# Patient Record
Sex: Male | Born: 1965 | Race: White | Hispanic: No | Marital: Single | Smoking: Former smoker
Health system: Southern US, Community
[De-identification: ages and names within clinical notes are randomized; demographics above are authoritative.]

## PROBLEM LIST (undated history)

## (undated) DIAGNOSIS — K7581 Nonalcoholic steatohepatitis (NASH): Secondary | ICD-10-CM

## (undated) DIAGNOSIS — K219 Gastro-esophageal reflux disease without esophagitis: Secondary | ICD-10-CM

## (undated) DIAGNOSIS — M069 Rheumatoid arthritis, unspecified: Secondary | ICD-10-CM

## (undated) DIAGNOSIS — R351 Nocturia: Secondary | ICD-10-CM

## (undated) DIAGNOSIS — E785 Hyperlipidemia, unspecified: Secondary | ICD-10-CM

## (undated) DIAGNOSIS — I7300 Raynaud's syndrome without gangrene: Secondary | ICD-10-CM

## (undated) DIAGNOSIS — R0600 Dyspnea, unspecified: Secondary | ICD-10-CM

## (undated) DIAGNOSIS — J4520 Mild intermittent asthma, uncomplicated: Secondary | ICD-10-CM

## (undated) DIAGNOSIS — I509 Heart failure, unspecified: Secondary | ICD-10-CM

## (undated) DIAGNOSIS — Z87442 Personal history of urinary calculi: Secondary | ICD-10-CM

## (undated) DIAGNOSIS — M1990 Unspecified osteoarthritis, unspecified site: Secondary | ICD-10-CM

## (undated) DIAGNOSIS — R748 Abnormal levels of other serum enzymes: Secondary | ICD-10-CM

## (undated) DIAGNOSIS — M5431 Sciatica, right side: Secondary | ICD-10-CM

## (undated) DIAGNOSIS — Z8249 Family history of ischemic heart disease and other diseases of the circulatory system: Secondary | ICD-10-CM

## (undated) DIAGNOSIS — E782 Mixed hyperlipidemia: Secondary | ICD-10-CM

## (undated) DIAGNOSIS — Z973 Presence of spectacles and contact lenses: Secondary | ICD-10-CM

## (undated) DIAGNOSIS — Z8489 Family history of other specified conditions: Secondary | ICD-10-CM

## (undated) DIAGNOSIS — M6281 Muscle weakness (generalized): Secondary | ICD-10-CM

## (undated) DIAGNOSIS — N289 Disorder of kidney and ureter, unspecified: Secondary | ICD-10-CM

## (undated) HISTORY — PX: COLONOSCOPY: SHX174

## (undated) HISTORY — DX: Family history of ischemic heart disease and other diseases of the circulatory system: Z82.49

## (undated) HISTORY — DX: Hyperlipidemia, unspecified: E78.5

---
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
## 1999-02-10 ENCOUNTER — Encounter: Payer: Self-pay | Admitting: Family Medicine

## 1999-02-10 ENCOUNTER — Ambulatory Visit (HOSPITAL_COMMUNITY): Admission: RE | Admit: 1999-02-10 | Discharge: 1999-02-10 | Payer: Self-pay | Admitting: Family Medicine

## 1999-02-11 ENCOUNTER — Emergency Department (HOSPITAL_COMMUNITY): Admission: EM | Admit: 1999-02-11 | Discharge: 1999-02-11 | Payer: Self-pay | Admitting: Emergency Medicine

## 2001-09-17 DIAGNOSIS — K760 Fatty (change of) liver, not elsewhere classified: Secondary | ICD-10-CM

## 2001-09-17 HISTORY — DX: Fatty (change of) liver, not elsewhere classified: K76.0

## 2002-03-17 HISTORY — PX: UPPER GI ENDOSCOPY: SHX6162

## 2002-04-17 HISTORY — PX: LIVER BIOPSY: SHX301

## 2002-04-27 ENCOUNTER — Ambulatory Visit (HOSPITAL_COMMUNITY): Admission: RE | Admit: 2002-04-27 | Discharge: 2002-04-27 | Payer: Self-pay | Admitting: Gastroenterology

## 2002-05-29 ENCOUNTER — Encounter: Payer: Self-pay | Admitting: Rheumatology

## 2002-05-29 ENCOUNTER — Ambulatory Visit (HOSPITAL_COMMUNITY): Admission: RE | Admit: 2002-05-29 | Discharge: 2002-05-29 | Payer: Self-pay | Admitting: Rheumatology

## 2002-05-29 HISTORY — PX: LIVER BIOPSY: SHX301

## 2002-12-30 ENCOUNTER — Encounter: Payer: Self-pay | Admitting: Rheumatology

## 2002-12-30 ENCOUNTER — Encounter: Admission: RE | Admit: 2002-12-30 | Discharge: 2002-12-30 | Payer: Self-pay | Admitting: Rheumatology

## 2006-04-08 ENCOUNTER — Encounter: Admission: RE | Admit: 2006-04-08 | Discharge: 2006-04-08 | Payer: Self-pay | Admitting: Family Medicine

## 2011-11-27 ENCOUNTER — Other Ambulatory Visit: Payer: Self-pay

## 2011-11-27 ENCOUNTER — Encounter (HOSPITAL_COMMUNITY): Payer: Self-pay | Admitting: Cardiology

## 2011-11-27 ENCOUNTER — Emergency Department (HOSPITAL_COMMUNITY)
Admission: EM | Admit: 2011-11-27 | Discharge: 2011-11-27 | Disposition: A | Discharge status: Home / Self Care | Payer: 59 | Attending: Emergency Medicine | Admitting: Emergency Medicine

## 2011-11-27 DIAGNOSIS — R51 Headache: Secondary | ICD-10-CM | POA: Insufficient documentation

## 2011-11-27 DIAGNOSIS — R000 Tachycardia, unspecified: Secondary | ICD-10-CM | POA: Insufficient documentation

## 2011-11-27 DIAGNOSIS — J45909 Unspecified asthma, uncomplicated: Secondary | ICD-10-CM | POA: Insufficient documentation

## 2011-11-27 DIAGNOSIS — R509 Fever, unspecified: Secondary | ICD-10-CM | POA: Insufficient documentation

## 2011-11-27 HISTORY — DX: Unspecified osteoarthritis, unspecified site: M19.90

## 2011-11-27 LAB — DIFFERENTIAL
Eosinophils Relative: 3 % (ref 0–5)
Lymphocytes Relative: 17 % (ref 12–46)
Lymphs Abs: 1.4 10*3/uL (ref 0.7–4.0)
Monocytes Absolute: 0.7 10*3/uL (ref 0.1–1.0)

## 2011-11-27 LAB — BASIC METABOLIC PANEL
BUN: 12 mg/dL (ref 6–23)
CO2: 26 mEq/L (ref 19–32)
Calcium: 8.6 mg/dL (ref 8.4–10.5)
Creatinine, Ser: 1.27 mg/dL (ref 0.50–1.35)
Glucose, Bld: 90 mg/dL (ref 70–99)

## 2011-11-27 LAB — CBC
HCT: 44.5 % (ref 39.0–52.0)
MCH: 34.8 pg — ABNORMAL HIGH (ref 26.0–34.0)
MCV: 98.7 fL (ref 78.0–100.0)
RBC: 4.51 MIL/uL (ref 4.22–5.81)
WBC: 8.3 10*3/uL (ref 4.0–10.5)

## 2011-11-27 MED ORDER — KETOROLAC TROMETHAMINE 15 MG/ML IJ SOLN
30.0000 mg | Freq: Once | INTRAMUSCULAR | Status: DC
  Filled 2011-11-27: qty 2

## 2011-11-27 MED ORDER — SODIUM CHLORIDE 0.9 % IV BOLUS (SEPSIS)
1000.0000 mL | Freq: Once | INTRAVENOUS | Status: AC
  Administered 2011-11-27: 1000 mL via INTRAVENOUS

## 2011-11-27 MED ORDER — HYDROMORPHONE HCL PF 1 MG/ML IJ SOLN
1.0000 mg | Freq: Once | INTRAMUSCULAR | Status: AC
  Administered 2011-11-27: 1 mg via INTRAVENOUS
  Filled 2011-11-27: qty 1

## 2011-11-27 MED ORDER — ACETAMINOPHEN 500 MG PO TABS
1000.0000 mg | ORAL_TABLET | Freq: Once | ORAL | Status: AC
  Administered 2011-11-27: 1000 mg via ORAL
  Filled 2011-11-27: qty 2

## 2011-11-27 MED ORDER — DIPHENHYDRAMINE HCL 50 MG/ML IJ SOLN
25.0000 mg | Freq: Once | INTRAMUSCULAR | Status: AC
  Administered 2011-11-27: 25 mg via INTRAVENOUS
  Filled 2011-11-27: qty 1

## 2011-11-27 MED ORDER — METOCLOPRAMIDE HCL 5 MG/ML IJ SOLN
10.0000 mg | Freq: Once | INTRAMUSCULAR | Status: AC
  Administered 2011-11-27: 10 mg via INTRAVENOUS
  Filled 2011-11-27: qty 2

## 2011-11-27 MED ORDER — SODIUM CHLORIDE 0.9 % IV SOLN
Freq: Once | INTRAVENOUS | Status: AC
  Administered 2011-11-27: 12:00:00 via INTRAVENOUS

## 2011-11-27 MED ORDER — KETOROLAC TROMETHAMINE 30 MG/ML IJ SOLN
INTRAMUSCULAR | Status: AC
  Administered 2011-11-27: 30 mg
  Filled 2011-11-27: qty 1

## 2011-11-27 MED ORDER — HYDROCODONE-ACETAMINOPHEN 5-325 MG PO TABS: ORAL_TABLET | ORAL | Status: DC

## 2011-11-27 NOTE — ED Notes (Signed)
Family at bedside. 

## 2011-11-27 NOTE — ED Notes (Signed)
Patient is resting comfortably. 

## 2011-11-27 NOTE — ED Provider Notes (Signed)
Medical screening examination/treatment/procedure(s) were conducted as a shared visit with non-physician practitioner(s) and myself.  I personally evaluated the patient during the encounter.  Fever to 103, sinus tachy on monitor and on ECG to 140, likely related to fever.  No neck stiffness, no AMS, no focal deficits, doubt meningitis.  Pt has chronic h/o sinus problems.  Will treat with antipyretics, IVF's and monitor for improvement.    Gavin Pound. Jasmeen Fritsch, MD 11/27/11 1544

## 2011-11-27 NOTE — Discharge Instructions (Signed)
Please read and follow all provided instructions.  Your diagnoses today include:  1. Fever   2. Headache     Tests performed today include:  Blood counts and electrolytes  Vital signs. See below for your results today.   Medications prescribed:   Vicodin (hydrocodone/acetaminophen) - narcotic pain medication  Take any prescribed medications only as directed.  Home care instructions:  Follow any educational materials contained in this packet.  Follow-up instructions: Please follow-up with your primary care provider in the next 2 days for further evaluation of your symptoms. If you do not have a primary care doctor -- see below for referral information.   Return instructions:   Please return to the Emergency Department if you experience worsening symptoms.  Please return if you have worsening severe headache, persistent vomiting, blurry vision, trouble walking, persistent fever, or if you have any other concerns.  Please return if you have any other emergent concerns.  Additional Information:  Your vital signs today were: BP 114/71  Pulse 109  Temp(Src) 100.6 F (38.1 C) (Oral)  Resp 16  SpO2 94% If your blood pressure (BP) was elevated above 135/85 this visit, please have this repeated by your doctor within one month. -------------- No Primary Care Doctor Call [facility]  [phone] Other agencies that provide inexpensive medical care    [facility]  [phone]    [facility]  [phone]    [facility]  [phone]    [facility]  [phone]    [facility]  [phone]    [facility]  [phone] -------------- RESOURCE GUIDE:  Dental Problems  Patients with Medicaid: [facility] [phone] [address]                                            [phone] [address] Phone:  [phone]                                                   Phone:  [phone]  If unable  to pay or uninsured, contact:  [facility] or [facility]. to become qualified for the adult dental clinic.  Chronic Pain Problems Contact [facility]  [phone] Patients need to be referred by their primary care doctor.  Insufficient Money for Medicine Contact [employer]:  call "211" or [facility] [phone].  Psychological Services [facility]  [phone] [facility]  [phone] [facility]   [phone] (emergency services [phone])  Substance Abuse Resources [facility]  [phone] [facility] [phone] [facility] [place] [phone] [place] [phone] Residential & Outpatient Substance Abuse Program  [phone]  Abuse/Neglect [facility] Child Abuse Hotline [phone] [facility] Child Abuse Hotline [phone] (After Hours)  Emergency Shelter [facility] [phone]  Maternity Homes [facility] at the [place] [facility] [place] [phone] [facility] [phone]  [facility]  [facility] [address] [place]                       [address]      [address]  [place]                                                [place]                            [place] Phone:  [phone]                                   Phone:  [phone]                 Phone:  [phone]  [facility] Phone:  [phone]  [place] Child Abuse Hotline [phone]) 342-1394 [phone] (After Hours)    

## 2011-11-27 NOTE — ED Provider Notes (Signed)
History     CSN: [id number]  Arrival date & time 11/27/11  1107   First MD Initiated Contact with Patient 11/27/11 1107      Chief Complaint  Patient presents with  . Headache  . Tachycardia    (Consider location/radiation/quality/duration/timing/severity/associated sxs/prior treatment) HPI Comments: Patient presents with onset of bilateral headache described as pressure beginning 2 days ago. Patient does not have a history of headaches and denies photo and phonophobia. No nausea or vomiting. Headache initially improved with over-the-counter medications however has been persistent over the past 24 hours despite treatment. Patient denies numbness/tingling/weakness in extremities. He has not had any trouble speaking or trouble walking. Denies blurry vision. Patient presents to the emergency department with fever to 103F and heart rate in the 140s. Patient denies chest pain or history of heart problems. Patient denies cough. Patient has chronic sinus problems.  Patient is a 46 y.o. male presenting with headaches. The history is provided by the patient.  Headache  This is a new problem. The current episode started 2 days ago. The problem occurs constantly. The problem has not changed since onset.The headache is associated with nothing. The pain is located in the bilateral region. The pain is moderate. The pain does not radiate. Associated symptoms include a fever. Pertinent negatives include no chest pressure, no near-syncope, no shortness of breath, no nausea and no vomiting. He has tried acetaminophen for the symptoms. The treatment provided mild relief.    Past Medical History  Diagnosis Date  . Arthritis   . Asthma   . Allergic arthritis     History reviewed. No pertinent past surgical history.  History reviewed. No pertinent family history.  History  Substance Use Topics  . Smoking status: Never Smoker   . Smokeless tobacco: Not on file  . Alcohol Use: Yes      Review of  Systems  Constitutional: Positive for fever.  HENT: Positive for congestion and sinus pressure. Negative for ear pain, sore throat, rhinorrhea, neck pain and neck stiffness.   Eyes: Negative for redness.  Respiratory: Negative for cough and shortness of breath.   Cardiovascular: Negative for chest pain and near-syncope.  Gastrointestinal: Negative for nausea, vomiting, abdominal pain and diarrhea.  Genitourinary: Negative for dysuria.  Musculoskeletal: Negative for myalgias.  Skin: Negative for rash.  Neurological: Positive for headaches. Negative for dizziness, syncope, speech difficulty, weakness, light-headedness and numbness.  Psychiatric/Behavioral: Negative for confusion.    Allergies  Review of patient's allergies indicates no known allergies.  Home Medications  No current outpatient prescriptions on file.  BP 150/99  Temp(Src) 103 F (39.4 C) (Oral)  Resp 17  SpO2 96%  Physical Exam  Nursing note and vitals reviewed. Constitutional: He is oriented to person, place, and time. He appears well-developed and well-nourished.  HENT:  Head: Normocephalic and atraumatic. Head is without raccoon's eyes and without Battle's sign.  Right Ear: Tympanic membrane, external ear and ear canal normal.  Left Ear: Tympanic membrane, external ear and ear canal normal.  Nose: Mucosal edema present. No rhinorrhea. Right sinus exhibits frontal sinus tenderness. Left sinus exhibits frontal sinus tenderness.  Mouth/Throat: Oropharynx is clear and moist. Mucous membranes are not dry.  Eyes: Conjunctivae are normal. Pupils are equal, round, and reactive to light. Right eye exhibits no discharge. Left eye exhibits no discharge.  Neck: Trachea normal and normal range of motion. Neck supple. No rigidity. Normal range of motion present.       No meningismus  Cardiovascular: Regular rhythm,  normal heart sounds and intact distal pulses.  Tachycardia present.   No murmur heard. Pulmonary/Chest: Effort  normal and breath sounds normal.  Abdominal: Soft. There is no tenderness. There is no rebound and no guarding.  Musculoskeletal: He exhibits no edema and no tenderness.  Neurological: He is alert and oriented to person, place, and time.  Skin: Skin is warm and dry.  Psychiatric: He has a normal mood and affect.    ED Course  Procedures (including critical care time)  Labs Reviewed  CBC - Abnormal; Notable for the following:    MCH 34.8 (*)    All other components within normal limits  BASIC METABOLIC PANEL - Abnormal; Notable for the following:    GFR calc non Af Amer 66 (*)    GFR calc Af Amer 76 (*)    All other components within normal limits  DIFFERENTIAL   No results found.   1. Fever   2. Headache     11:25 AM Patient seen and examined. Work-up initiated. Medications ordered. Discussed with and seen with Dr. Oletta Lamas.   Vital signs reviewed and are as follows: Filed Vitals:   11/27/11 1117  BP: 150/99  Temp: 103 F (39.4 C)  Resp: 17    Date: 11/27/2011  Rate: 144  Rhythm: sinus tachycardia  QRS Axis: right  Intervals: normal  ST/T Wave abnormalities: normal  Conduction Disutrbances:none  Narrative Interpretation:   Old EKG Reviewed: none available  1:20 PM exam unchanged. Patient is continuing to complain about significant pain. He had mild improvement with Toradol. Heart rate 125. Will give additional bolus. Will try Reglan Benadryl as well as pain medication.  2:24 PM Fever improved. Pt states pain is gone after reglan/dilaudid. HR 100. Will monitor.   3:33 PM Patient re-examined. Exam unchanged. Pt states mild headache but much improved from earlier. He is agreeable to discharge home. Pt sees Dr. Cindee Lame and can follow-up. Urged patient to return with worsening symptoms including worsening severe headache, vomiting, blurry vision, trouble walking, confusion, or any other concerns. Tachycardia remains resolved.  MDM  Patient with headache and tachycardia.  Tachycardia likely secondary to dehydration and fever. Tachycardia resolved with fluids and antipyretics. Headache is improved after pain medication and Reglan. Neuro exam is normal. Patient does not have any neurological deficits. No history of head trauma. Do not suspect intracranial abnormality at this point. Do not suspect meningitis/encephalitis given no meningismus or altered sensorium. CT scan is not indicated at this point. Patient will need to follow up with his primary care physician. Symptoms are likely secondary to a viral infection.        Renne Crigler, [place] 11/27/11 1542

## 2011-11-27 NOTE — ED Notes (Signed)
Pt to department via EMS from home c/o headache that started on Sunday. Reports that he has had sinus pressure and allergies. Bp- 149/109 HR-140. No acute distress on arrival. Pt was negative for orthostatics, but tachycardic in route.

## 2011-11-30 ENCOUNTER — Ambulatory Visit
Admission: RE | Admit: 2011-11-30 | Discharge: 2011-11-30 | Disposition: A | Discharge status: Home / Self Care | Payer: 59 | Source: Ambulatory Visit | Attending: Internal Medicine | Admitting: Internal Medicine

## 2011-11-30 ENCOUNTER — Other Ambulatory Visit: Payer: Self-pay | Admitting: Internal Medicine

## 2011-11-30 DIAGNOSIS — R509 Fever, unspecified: Secondary | ICD-10-CM

## 2011-11-30 DIAGNOSIS — R51 Headache: Secondary | ICD-10-CM

## 2011-12-01 ENCOUNTER — Emergency Department (HOSPITAL_COMMUNITY): Payer: 59

## 2011-12-01 ENCOUNTER — Inpatient Hospital Stay (HOSPITAL_COMMUNITY)
Admission: EM | Admit: 2011-12-01 | Discharge: 2011-12-04 | DRG: 693 | Disposition: A | Discharge status: Home / Self Care | Payer: 59 | Attending: Internal Medicine | Admitting: Internal Medicine

## 2011-12-01 ENCOUNTER — Encounter (HOSPITAL_COMMUNITY): Payer: Self-pay | Admitting: Adult Health

## 2011-12-01 DIAGNOSIS — J189 Pneumonia, unspecified organism: Secondary | ICD-10-CM | POA: Diagnosis present

## 2011-12-01 DIAGNOSIS — N201 Calculus of ureter: Secondary | ICD-10-CM

## 2011-12-01 DIAGNOSIS — R51 Headache: Secondary | ICD-10-CM | POA: Diagnosis present

## 2011-12-01 DIAGNOSIS — N390 Urinary tract infection, site not specified: Secondary | ICD-10-CM | POA: Diagnosis present

## 2011-12-01 DIAGNOSIS — J984 Other disorders of lung: Secondary | ICD-10-CM

## 2011-12-01 DIAGNOSIS — N200 Calculus of kidney: Principal | ICD-10-CM | POA: Diagnosis present

## 2011-12-01 DIAGNOSIS — N179 Acute kidney failure, unspecified: Secondary | ICD-10-CM | POA: Diagnosis present

## 2011-12-01 DIAGNOSIS — J9600 Acute respiratory failure, unspecified whether with hypoxia or hypercapnia: Secondary | ICD-10-CM | POA: Diagnosis present

## 2011-12-01 DIAGNOSIS — M129 Arthropathy, unspecified: Secondary | ICD-10-CM | POA: Diagnosis present

## 2011-12-01 DIAGNOSIS — R519 Headache, unspecified: Secondary | ICD-10-CM | POA: Diagnosis present

## 2011-12-01 DIAGNOSIS — N134 Hydroureter: Secondary | ICD-10-CM | POA: Diagnosis present

## 2011-12-01 DIAGNOSIS — E876 Hypokalemia: Secondary | ICD-10-CM

## 2011-12-01 DIAGNOSIS — M1990 Unspecified osteoarthritis, unspecified site: Secondary | ICD-10-CM

## 2011-12-01 DIAGNOSIS — R109 Unspecified abdominal pain: Secondary | ICD-10-CM | POA: Diagnosis present

## 2011-12-01 DIAGNOSIS — J45909 Unspecified asthma, uncomplicated: Secondary | ICD-10-CM | POA: Diagnosis present

## 2011-12-01 HISTORY — DX: Personal history of urinary calculi: Z87.442

## 2011-12-01 HISTORY — DX: Disorder of kidney and ureter, unspecified: N28.9

## 2011-12-01 LAB — URINE MICROSCOPIC-ADD ON

## 2011-12-01 LAB — CBC
HCT: 42.7 % (ref 39.0–52.0)
Hemoglobin: 15.4 g/dL (ref 13.0–17.0)
MCV: 94.1 fL (ref 78.0–100.0)
RBC: 4.54 MIL/uL (ref 4.22–5.81)
WBC: 7.4 10*3/uL (ref 4.0–10.5)

## 2011-12-01 LAB — BASIC METABOLIC PANEL
BUN: 12 mg/dL (ref 6–23)
CO2: 26 mEq/L (ref 19–32)
Chloride: 99 mEq/L (ref 96–112)
Creatinine, Ser: 1.14 mg/dL (ref 0.50–1.35)
Glucose, Bld: 95 mg/dL (ref 70–99)
Potassium: 3.3 mEq/L — ABNORMAL LOW (ref 3.5–5.1)

## 2011-12-01 LAB — URINALYSIS, ROUTINE W REFLEX MICROSCOPIC
Bilirubin Urine: NEGATIVE
Glucose, UA: NEGATIVE mg/dL
Ketones, ur: NEGATIVE mg/dL
Protein, ur: NEGATIVE mg/dL

## 2011-12-01 MED ORDER — ONDANSETRON 8 MG PO TBDP
8.0000 mg | ORAL_TABLET | Freq: Once | ORAL | Status: AC
  Administered 2011-12-01: 8 mg via ORAL
  Filled 2011-12-01: qty 1

## 2011-12-01 MED ORDER — FOLIC ACID 1 MG PO TABS
1.0000 mg | ORAL_TABLET | Freq: Every day | ORAL | Status: DC
  Administered 2011-12-02 – 2011-12-04 (×3): 1 mg via ORAL
  Filled 2011-12-01 (×4): qty 1

## 2011-12-01 MED ORDER — EZETIMIBE 10 MG PO TABS
10.0000 mg | ORAL_TABLET | Freq: Every day | ORAL | Status: DC
  Administered 2011-12-02 – 2011-12-04 (×3): 10 mg via ORAL
  Filled 2011-12-01 (×4): qty 1

## 2011-12-01 MED ORDER — LORATADINE 10 MG PO TABS
10.0000 mg | ORAL_TABLET | Freq: Every day | ORAL | Status: DC
  Administered 2011-12-02 – 2011-12-04 (×3): 10 mg via ORAL
  Filled 2011-12-01 (×4): qty 1

## 2011-12-01 MED ORDER — METOCLOPRAMIDE HCL 5 MG/ML IJ SOLN
10.0000 mg | Freq: Once | INTRAMUSCULAR | Status: AC
  Administered 2011-12-01: 10 mg via INTRAVENOUS
  Filled 2011-12-01: qty 2

## 2011-12-01 MED ORDER — BENZONATATE 100 MG PO CAPS
100.0000 mg | ORAL_CAPSULE | Freq: Three times a day (TID) | ORAL | Status: DC | PRN
  Filled 2011-12-01: qty 1

## 2011-12-01 MED ORDER — SODIUM CHLORIDE 0.9 % IJ SOLN
3.0000 mL | Freq: Two times a day (BID) | INTRAMUSCULAR | Status: DC
  Administered 2011-12-02 (×2): 3 mL via INTRAVENOUS

## 2011-12-01 MED ORDER — RAMELTEON 8 MG PO TABS
8.0000 mg | ORAL_TABLET | Freq: Every evening | ORAL | Status: DC | PRN
  Filled 2011-12-01: qty 1

## 2011-12-01 MED ORDER — HYDROCODONE-ACETAMINOPHEN 5-325 MG PO TABS
1.0000 | ORAL_TABLET | Freq: Four times a day (QID) | ORAL | Status: DC | PRN
  Administered 2011-12-02 – 2011-12-03 (×2): 1 via ORAL
  Filled 2011-12-01 (×2): qty 1

## 2011-12-01 MED ORDER — POTASSIUM CHLORIDE CRYS ER 20 MEQ PO TBCR
40.0000 meq | EXTENDED_RELEASE_TABLET | Freq: Once | ORAL | Status: AC
  Administered 2011-12-02: 40 meq via ORAL
  Filled 2011-12-01: qty 2

## 2011-12-01 MED ORDER — SODIUM CHLORIDE 0.9 % IV BOLUS (SEPSIS)
1000.0000 mL | Freq: Once | INTRAVENOUS | Status: AC
  Administered 2011-12-01: 1000 mL via INTRAVENOUS

## 2011-12-01 MED ORDER — DEXTROSE 5 % IV SOLN
500.0000 mg | INTRAVENOUS | Status: DC
  Administered 2011-12-01: 500 mg via INTRAVENOUS
  Filled 2011-12-01 (×2): qty 500

## 2011-12-01 MED ORDER — AZELASTINE HCL 0.1 % NA SOLN
1.0000 | Freq: Two times a day (BID) | NASAL | Status: DC
  Administered 2011-12-02 – 2011-12-04 (×5): 1 via NASAL
  Filled 2011-12-01: qty 30

## 2011-12-01 MED ORDER — HYDROMORPHONE HCL PF 1 MG/ML IJ SOLN
1.0000 mg | Freq: Once | INTRAMUSCULAR | Status: AC
  Administered 2011-12-01: 1 mg via INTRAVENOUS
  Filled 2011-12-01: qty 1

## 2011-12-01 MED ORDER — ALBUTEROL SULFATE (5 MG/ML) 0.5% IN NEBU
2.5000 mg | INHALATION_SOLUTION | RESPIRATORY_TRACT | Status: DC | PRN
  Filled 2011-12-01: qty 0.5

## 2011-12-01 MED ORDER — SODIUM CHLORIDE 0.9 % IV SOLN: 250.0000 mL | INTRAVENOUS | Status: DC | PRN

## 2011-12-01 MED ORDER — PANTOPRAZOLE SODIUM 40 MG PO TBEC
40.0000 mg | DELAYED_RELEASE_TABLET | Freq: Every day | ORAL | Status: DC
  Administered 2011-12-02: 40 mg via ORAL
  Filled 2011-12-01: qty 1

## 2011-12-01 MED ORDER — FENOFIBRATE 160 MG PO TABS
160.0000 mg | ORAL_TABLET | Freq: Every day | ORAL | Status: DC
  Administered 2011-12-02 – 2011-12-04 (×3): 160 mg via ORAL
  Filled 2011-12-01 (×4): qty 1

## 2011-12-01 MED ORDER — FUROSEMIDE 10 MG/ML IJ SOLN
40.0000 mg | Freq: Two times a day (BID) | INTRAMUSCULAR | Status: AC
  Administered 2011-12-02 (×2): 40 mg via INTRAVENOUS
  Filled 2011-12-01 (×2): qty 4

## 2011-12-01 MED ORDER — IPRATROPIUM BROMIDE 0.02 % IN SOLN
0.5000 mg | RESPIRATORY_TRACT | Status: DC | PRN
  Filled 2011-12-01: qty 2.5

## 2011-12-01 MED ORDER — KETOROLAC TROMETHAMINE 30 MG/ML IJ SOLN
30.0000 mg | Freq: Once | INTRAMUSCULAR | Status: AC
  Administered 2011-12-01: 30 mg via INTRAVENOUS
  Filled 2011-12-01: qty 1

## 2011-12-01 MED ORDER — SODIUM CHLORIDE 0.9 % IJ SOLN: 3.0000 mL | INTRAMUSCULAR | Status: DC | PRN

## 2011-12-01 MED ORDER — DEXTROSE 5 % IV SOLN
1.0000 g | INTRAVENOUS | Status: DC
  Administered 2011-12-01: 1 g via INTRAVENOUS
  Filled 2011-12-01 (×2): qty 10

## 2011-12-01 NOTE — ED Notes (Signed)
2nd attempt made to call report to 4E. RN is in a patient's room at this time. Consulting civil engineer unavailable. Plan to call back in 5-10 minutes.

## 2011-12-01 NOTE — ED Provider Notes (Signed)
Pt presents w/ constant, severe, R flank pain w/ radiation to right lower abd/groin since 3:30pm today.  Associated w/ nausea.  Recent URI but no fever today and denies urinary sx.  Pain typical of kidney stones he has had in the past, the most recent being 2-3 years ago.  No recent injury.  On exam, uncomfortable appearing, R CVA tenderness, abd benign/non-tender.  U/A and CT abd/pelvis pending.  IV NS and dilaudid have been ordered and he has already received ODT zofran.    Otilio Miu, [place] 12/01/11 1702

## 2011-12-01 NOTE — ED Notes (Addendum)
C/o right sided flank pain that feels just like a kidney stone associated with nausea, began at 3:00pm today. Denies dysuria, c/o oliguria. Took one hydrocone at 2 pm for a viral infection that was causing headaches.

## 2011-12-01 NOTE — H&P (Signed)
PCP:   Dr. Ludwig Clarks   Chief Complaint:  Flank pain  HPI: This is a pleasant 46 year old gentleman who states his been ill from last Sunday. Sunday symptoms started with a headache and sinuses pressure, which were initially relieved with pain medications. Symptoms returned Monday, Tuesday and Wednesday, this time unrelieved with over-the-counter pain medications. He [facility] to [facility] on [facility], he was discharged but had no significant improvement. By Wednesday he was developing shortness of breath and a cough. he followed up his PCP, chest x-ray and sinus films were done. He was discharged with Avelox and for six-day course. He was taking this and hydrocodone. Yesterday he developed back pain right-sided, which today moved to the front. He has a history of nephrolithiasis, he realized the cause and came to the ER. He does report decreased urine output today. No obvious hematuria. He denies fever, chills, nausea, vomiting, burning urination, or diarrhea.  In the ER the patient is a bit hypoxic with ambulation. He denies hemoptysis. The patient does report he has allergies. He sees an allergist and gets allergy shots. He currently sees a new allergist who recently change his medication regimen. He normally gets January and February allergy flare, which he states he did get this season as well. History provided by the patient.  Review of Systems: Positives bolded  anorexia, fever, weight loss,, vision loss, decreased hearing, hoarseness, chest pain, syncope, dyspnea on exertion, peripheral edema, balance deficits, hemoptysis, abdominal pain, melena, hematochezia, severe indigestion/heartburn, hematuria, incontinence, genital sores, muscle weakness, suspicious skin lesions, transient blindness, difficulty walking, depression, unusual weight change, abnormal bleeding, enlarged lymph nodes, angioedema, and breast masses.  Past Medical History: Past Medical History  Diagnosis Date  . Arthritis   .  Asthma   . Allergic arthritis   . Renal disorder   . History of nephrolithiasis    History reviewed. No pertinent past surgical history.  Medications: Prior to Admission medications   Medication Sig Start Date End Date Taking? Authorizing Provider  Ascorbic Acid (VITAMIN C PO) Take 1 tablet by mouth daily.   Yes Historical Provider, MD  aspirin EC 81 MG tablet Take 81 mg by mouth daily.   Yes Historical Provider, MD  azelastine (ASTELIN) 137 MCG/SPRAY nasal spray Place 1 spray into the nose 2 (two) times daily. Use in each nostril as directed   Yes Historical Provider, MD  ezetimibe (ZETIA) 10 MG tablet Take 10 mg by mouth daily.   Yes Historical Provider, MD  fenofibrate 160 MG tablet Take 160 mg by mouth daily.   Yes Historical Provider, MD  fexofenadine (ALLEGRA) 180 MG tablet Take 180 mg by mouth daily.   Yes Historical Provider, MD  fluticasone (FLONASE) 50 MCG/ACT nasal spray Place 2 sprays into the nose daily.   Yes Historical Provider, MD  fluticasone-salmeterol (ADVAIR HFA) 115-21 MCG/ACT inhaler Inhale 2 puffs into the lungs 2 (two) times daily.   Yes Historical Provider, MD  folic acid (FOLVITE) 1 MG tablet Take 1 mg by mouth daily.   Yes Historical Provider, MD  HYDROcodone-acetaminophen (NORCO) 5-325 MG per tablet 1 tablet every 6 (six) hours as needed. Take 1-2 tablets every 6 hours as needed for severe pain 11/27/11 12/07/11 Yes Renne Crigler, PA  ibuprofen (ADVIL,MOTRIN) 200 MG tablet Take 200 mg by mouth every 6 (six) hours as needed. Pain.   Yes Historical Provider, MD  moxifloxacin (AVELOX) 400 MG tablet Take 400 mg by mouth daily.   Yes Historical Provider, MD  Multiple Vitamin (  MULITIVITAMIN WITH MINERALS) TABS Take 1 tablet by mouth daily.   Yes Historical Provider, MD  pantoprazole (PROTONIX) 40 MG tablet Take 40 mg by mouth daily.   Yes Historical Provider, MD  ramelteon (ROZEREM) 8 MG tablet Take 8 mg by mouth daily as needed. For sleep   Yes Historical Provider, MD    Abatacept (ORENCIA) 125 MG/ML SOLN Inject 125 mg into the skin once a week. On sundays    Historical Provider, MD  methotrexate (RHEUMATREX) 2.5 MG tablet Take 7.5 mg by mouth 2 (two) times a week. Takes on Mondays and Tuesdays each week.    Historical Provider, MD  Pseudoephedrine-Ibuprofen (ADVIL COLD & SINUS LIQUI-GELS PO) Take 1 capsule by mouth daily as needed. For allergies    Historical Provider, MD    Allergies:  No Known Allergies  Social History:  reports that he has quit smoking. He does not have any smokeless tobacco history on file. He reports that he drinks alcohol. He reports that he does not use illicit drugs.  Family History: Family History  Problem Relation Age of Onset  . Hypertension    . Coronary artery disease      Physical Exam: Filed Vitals:   12/01/11 1641 12/01/11 1930  BP: 121/76   Pulse: 86   Temp: 99 F (37.2 C)   TempSrc: Oral   Height: 5\' 10"  (1.778 m)   Weight: 79.379 kg (175 lb)   SpO2: 94% 94%    General:  Alert and oriented times three, well developed and nourished, no acute distress Eyes: PERRLA, pink conjunctiva, no scleral icterus ENT: Moist oral mucosa, neck supple, no thyromegaly Lungs: Mild crackles bilateral bases, no use of accessory muscles Cardiovascular: regular rate and rhythm, no regurgitation, no gallops, no murmurs. No carotid bruits, no JVD Abdomen: soft, positive BS, non-tender, non-distended, no organomegaly, not an acute abdomen GU: not examined Neuro: CN II - XII grossly intact, sensation intact Musculoskeletal: strength 5/5 all extremities, no clubbing, cyanosis or edema Skin: no rash, no subcutaneous crepitation, no decubitus Psych: appropriate patient   Labs on Admission:   [facility] 12/01/11 1817  NA 137  K 3.3*  CL 99  CO2 26  GLUCOSE 95  BUN 12  CREATININE 1.14  CALCIUM 9.5  MG --  PHOS --   No results found for this basename: AST:2,ALT:2,ALKPHOS:2,BILITOT:2,PROT:2,ALBUMIN:2 in the last 72  hours No results found for this basename: LIPASE:2,AMYLASE:2 in the last 72 hours  Basename 12/01/11 1817  WBC 7.4  NEUTROABS --  HGB 15.4  HCT 42.7  MCV 94.1  PLT 240   No results found for this basename: CKTOTAL:3,CKMB:3,CKMBINDEX:3,TROPONINI:3 in the last 72 hours No components found with this basename: POCBNP:3 No results found for this basename: DDIMER:2 in the last 72 hours No results found for this basename: HGBA1C:2 in the last 72 hours No results found for this basename: CHOL:2,HDL:2,LDLCALC:2,TRIG:2,CHOLHDL:2,LDLDIRECT:2 in the last 72 hours No results found for this basename: TSH,T4TOTAL,FREET3,T3FREE,THYROIDAB in the last 72 hours No results found for this basename: VITAMINB12:2,FOLATE:2,FERRITIN:2,TIBC:2,IRON:2,RETICCTPCT:2 in the last 72 hours  Micro Results: No results found for this or any previous visit (from the past 240 hour(s)).  Urinalysis pending  Radiological Exams on Admission: Ct Abdomen Pelvis Wo Contrast  12/01/2011  *RADIOLOGY REPORT*  Clinical Data: The right kidney stone, right flank pain  CT ABDOMEN AND PELVIS WITHOUT CONTRAST  Technique:  Multidetector CT imaging of the abdomen and pelvis was performed following the standard protocol without intravenous contrast.  Comparison: None.  Findings:  There is diffuse ground-glass opacities at the lung bases and interlobular septal thickening.  No pericardial fluid.  Non-IV contrast images demonstrate no focal hepatic lesion.  The gallbladder, pancreas, spleen, adrenal glands normal.  There are four nonobstructing calculi within the right kidney. There is mild pelvicaliectasis on the right and hydroureter on the right.  This secondary to a partially obstructing calculus at the right ureteral pelvic junction measuring 5 mm (image 44).  There are 6 calculi within the left kidney ranging size from 2-4 mm.  No evidence of left ureteral lithiasis.  The stomach, small bowel, and colon are normal.  Abdominal aorta normal  caliber.  No retroperitoneal periportal lymphadenopathy.  No free fluid the pelvis.  No distal ureteral stones or bladder stones.  The prostate gland is normal.  No pelvic lymphadenopathy. Review of  bone windows demonstrates no aggressive osseous lesions.  IMPRESSION:  1.  Partially obstructing calculus at the right ureteropelvic junction with mild pelvicaliectasis and hydroureter. 2.  Bilateral nephrolithiasis. 3.  Diffuse ground-glass opacity lung bases representing pulmonary edema or diffuse infection or hemorrhage.  Original Report Authenticated By: Genevive Bi, M.D.   Dg Sinuses Complete  11/30/2011  *RADIOLOGY REPORT*  Clinical Data: Cough, fever for 5 days, smoking history  PARANASAL SINUSES - COMPLETE 3 + VIEW  Comparison: None.  Findings: The paranasal sinuses are well pneumatized.  There is no evidence of sinusitis.  No bony abnormality is seen.  IMPRESSION: No sinusitis.  Original Report Authenticated By: Juline Patch, M.D.   Dg Chest 2 View  12/01/2011  *RADIOLOGY REPORT*  Clinical Data: Shortness of breath, hypoxia  CHEST - 2 VIEW  Comparison: 11/30/2011  Findings: Cardiomegaly again noted.  There is worsening interstitial prominence bilateral perihilar and especially lower lobes.  Findings suspicious for developing edema or pneumonitis. No segmental infiltrate is noted.  IMPRESSION: Worsening interstitial prominence bilateral perihilar and bilateral lower lobe suspicious for developing edema or pneumonitis.  No focal infiltrate.  Original Report Authenticated By: Natasha Mead, M.D.   Dg Chest 2 View  11/30/2011  *RADIOLOGY REPORT*  Clinical Data: , fever for 5 days, smoking history  CHEST - 2 VIEW  Comparison: None.  Findings: There are coarsely prominent interstitial markings throughout the lungs most likely chronic in nature.  An infectious or inflammatory process would be difficult to exclude however and follow-up chest x-ray is recommended to assess stability.  No pleural effusion is  seen.  No adenopathy is noted.  The heart is within normal limits in size.  No bony abnormality is seen.  IMPRESSION: Coarse interstitial lung markings bilaterally.  Possibly chronic in nature but cannot exclude an infectious or inflammatory process. Recommend follow-up.  Original Report Authenticated By: Juline Patch, M.D.    Assessment/Plan Present on Admission:  . bilateral Nephrolithiasis Mild to hydroureter Admit to MedSurg Urologist been consulted by ER physician Pain medication as needed   urinalysis pending and antibiotics started Strict I.'s and O.'s Pneumonitis Patient on Avelox as outpatient, chest x-ray today shows a worsening infiltrate suspicious for edema or pneumonitis. Patient with allergies, for which he sees an allergist. He normally gets shots, however currently is on methotrexate and abetacept. These will be held, as they could be delaying healing process, if patient with infection. sputum and blood cultures will be ordered. Will give patient Lasix given the possibility of edema. An echo will be ordered. Aggressive antibiotics started, as patient is considered immunosuppressed. Repeat chest x-ray in a.m. Recommend pulmonary consult in a.m. if  deemed necessary Patient on aspirin, this has been held as CT of the pelvis included hemorrhage in the differential diagnosis Arthritis Asthma Stable resume home medication  Full code SCDs for DVT prophylaxis Team 2/Dr. Nadara Mustard, Alekxander Isola 12/01/2011, 9:27 PM

## 2011-12-01 NOTE — ED Provider Notes (Signed)
History     CSN: [id number]  Arrival date & time 12/01/11  1637   First MD Initiated Contact with Patient 12/01/11 1739      Chief Complaint  Patient presents with  . Nephrolithiasis    (Consider location/radiation/quality/duration/timing/severity/associated sxs/prior treatment) HPI Comments: Right flank pain with radiation to the right groin  Patient is a 46 y.o. male presenting with flank pain. The history is provided by the patient.  Flank Pain This is a recurrent (History of kidney stones until similar to this, most recently 2 years ago) problem. Episode onset: 3:30 p.m. today. The problem occurs constantly. The problem has been gradually worsening. Associated symptoms include chills, congestion, coughing, a fever and nausea. Pertinent negatives include no abdominal pain, headaches, neck pain, rash, sore throat, vomiting or weakness. The symptoms are aggravated by nothing. He has tried nothing for the symptoms.   recent did receive a dose of narcotic pain medication prior to my arrival after his screening examination by another provider in the emergency department with significant improvement in his pain.  On my interview, the patient's oxygen saturation is around 80% on room air. It does rise to the mid 90s on supplementation at 4 L by nasal cannula. He reports that he has had a recent upper respiratory illness with symptoms of headache, fever , and nasal congestion beginning almost one week ago and more recently with development of shortness of breath. He had a chest x-ray performed yesterday as ordered by his primary Dr. but was reportedly told that it did not show any pneumonia. He has been taking Avelox for the last several days.  Past Medical History  Diagnosis Date  . Arthritis   . Asthma   . Allergic arthritis   . Renal disorder     History reviewed. No pertinent past surgical history.  History reviewed. No pertinent family history.  History  Substance Use Topics  .  Smoking status: Never Smoker   . Smokeless tobacco: Not on file  . Alcohol Use: Yes      Review of Systems  Constitutional: Positive for fever and chills.  HENT: Positive for congestion and sinus pressure. Negative for sore throat, neck pain and neck stiffness.   Eyes: Negative for pain and visual disturbance.  Respiratory: Positive for cough and shortness of breath. Negative for chest tightness.   Gastrointestinal: Positive for nausea. Negative for vomiting and abdominal pain.  Genitourinary: Positive for flank pain. Negative for hematuria.  Musculoskeletal: Positive for back pain.  Skin: Negative for rash and wound.  Neurological: Negative for dizziness, syncope, weakness and headaches.  Psychiatric/Behavioral: Negative for confusion.    Allergies  Review of patient's allergies indicates no known allergies.  Home Medications   Current Outpatient Rx  Name Route Sig Dispense Refill  . VITAMIN C PO Oral Take 1 tablet by mouth daily.    . ASPIRIN EC 81 MG PO TBEC Oral Take 81 mg by mouth daily.    . AZELASTINE HCL 137 MCG/SPRAY NA SOLN Nasal Place 1 spray into the nose 2 (two) times daily. Use in each nostril as directed    . EZETIMIBE 10 MG PO TABS Oral Take 10 mg by mouth daily.    . FENOFIBRATE 160 MG PO TABS Oral Take 160 mg by mouth daily.    Marland Kitchen FEXOFENADINE HCL 180 MG PO TABS Oral Take 180 mg by mouth daily.    Marland Kitchen FLUTICASONE PROPIONATE 50 MCG/ACT NA SUSP Nasal Place 2 sprays into the nose daily.    Marland Kitchen  FLUTICASONE-SALMETEROL 115-21 MCG/ACT IN AERO Inhalation Inhale 2 puffs into the lungs 2 (two) times daily.    Marland Kitchen FOLIC ACID 1 MG PO TABS Oral Take 1 mg by mouth daily.    Marland Kitchen HYDROCODONE-ACETAMINOPHEN 5-325 MG PO TABS  1 tablet every 6 (six) hours as needed. Take 1-2 tablets every 6 hours as needed for severe pain    . IBUPROFEN 200 MG PO TABS Oral Take 200 mg by mouth every 6 (six) hours as needed. Pain.    Marland Kitchen MOXIFLOXACIN HCL 400 MG PO TABS Oral Take 400 mg by mouth daily.    .  ADULT MULTIVITAMIN W/MINERALS CH Oral Take 1 tablet by mouth daily.    Marland Kitchen PANTOPRAZOLE SODIUM 40 MG PO TBEC Oral Take 40 mg by mouth daily.    Marland Kitchen RAMELTEON 8 MG PO TABS Oral Take 8 mg by mouth daily as needed. For sleep    . ABATACEPT 125 MG/ML [place] SOLN Subcutaneous Inject 125 mg into the skin once a week. On sundays    . METHOTREXATE (ANTI-RHEUMATIC) 2.5 MG PO TABS Oral Take 7.5 mg by mouth 2 (two) times a week. Takes on Mondays and Tuesdays each week.    Marland Kitchen ADVIL COLD & SINUS LIQUI-GELS PO Oral Take 1 capsule by mouth daily as needed. For allergies      BP 121/76  Pulse 86  Temp(Src) 99 F (37.2 C) (Oral)  Ht 5\' 10"  (1.778 m)  Wt 175 lb (79.379 kg)  BMI 25.11 kg/m2  SpO2 94%  Physical Exam  Nursing note and vitals reviewed. Constitutional: He is oriented to person, place, and time.       Well-developed, well-nourished, in no acute distress on my examination  HENT:  Head: Normocephalic and atraumatic.  Right Ear: External ear normal.  Left Ear: External ear normal.  Eyes: Conjunctivae are normal. Pupils are equal, round, and reactive to light.  Neck: Normal range of motion. Neck supple.  Cardiovascular: Normal rate, regular rhythm, normal heart sounds and intact distal pulses.   Pulmonary/Chest: Effort normal and breath sounds normal. No respiratory distress. He has no wheezes. He exhibits no tenderness.  Abdominal: Soft. Bowel sounds are normal. He exhibits no distension. There is no tenderness. There is no rebound and no guarding.  Genitourinary:       Negative CVA tenderness  Musculoskeletal: He exhibits no edema and no tenderness.  Neurological: He is alert and oriented to person, place, and time. No cranial nerve deficit.  Skin: Skin is warm and dry.  Psychiatric: He has a normal mood and affect.    ED Course  Procedures (including critical care time)  Labs Reviewed  CBC - Abnormal; Notable for the following:    MCHC 36.1 (*)    All other components within normal limits    BASIC METABOLIC PANEL - Abnormal; Notable for the following:    Potassium 3.3 (*)    GFR calc non Af Amer 75 (*)    GFR calc Af Amer 87 (*)    All other components within normal limits  URINALYSIS, ROUTINE W REFLEX MICROSCOPIC  CULTURE, BLOOD (ROUTINE X 2)  CULTURE, BLOOD (ROUTINE X 2)   Ct Abdomen Pelvis Wo Contrast  12/01/2011  *RADIOLOGY REPORT*  Clinical Data: The right kidney stone, right flank pain  CT ABDOMEN AND PELVIS WITHOUT CONTRAST  Technique:  Multidetector CT imaging of the abdomen and pelvis was performed following the standard protocol without intravenous contrast.  Comparison: None.  Findings: There is diffuse ground-glass opacities  at the lung bases and interlobular septal thickening.  No pericardial fluid.  Non-IV contrast images demonstrate no focal hepatic lesion.  The gallbladder, pancreas, spleen, adrenal glands normal.  There are four nonobstructing calculi within the right kidney. There is mild pelvicaliectasis on the right and hydroureter on the right.  This secondary to a partially obstructing calculus at the right ureteral pelvic junction measuring 5 mm (image 44).  There are 6 calculi within the left kidney ranging size from 2-4 mm.  No evidence of left ureteral lithiasis.  The stomach, small bowel, and colon are normal.  Abdominal aorta normal caliber.  No retroperitoneal periportal lymphadenopathy.  No free fluid the pelvis.  No distal ureteral stones or bladder stones.  The prostate gland is normal.  No pelvic lymphadenopathy. Review of  bone windows demonstrates no aggressive osseous lesions.  IMPRESSION:  1.  Partially obstructing calculus at the right ureteropelvic junction with mild pelvicaliectasis and hydroureter. 2.  Bilateral nephrolithiasis. 3.  Diffuse ground-glass opacity lung bases representing pulmonary edema or diffuse infection or hemorrhage.  Original Report Authenticated By: Genevive Bi, M.D.   Dg Sinuses Complete  11/30/2011  *RADIOLOGY REPORT*   Clinical Data: Cough, fever for 5 days, smoking history  PARANASAL SINUSES - COMPLETE 3 + VIEW  Comparison: None.  Findings: The paranasal sinuses are well pneumatized.  There is no evidence of sinusitis.  No bony abnormality is seen.  IMPRESSION: No sinusitis.  Original Report Authenticated By: Juline Patch, M.D.   Dg Chest 2 View  12/01/2011  *RADIOLOGY REPORT*  Clinical Data: Shortness of breath, hypoxia  CHEST - 2 VIEW  Comparison: 11/30/2011  Findings: Cardiomegaly again noted.  There is worsening interstitial prominence bilateral perihilar and especially lower lobes.  Findings suspicious for developing edema or pneumonitis. No segmental infiltrate is noted.  IMPRESSION: Worsening interstitial prominence bilateral perihilar and bilateral lower lobe suspicious for developing edema or pneumonitis.  No focal infiltrate.  Original Report Authenticated By: Natasha Mead, M.D.   Dg Chest 2 View  11/30/2011  *RADIOLOGY REPORT*  Clinical Data: , fever for 5 days, smoking history  CHEST - 2 VIEW  Comparison: None.  Findings: There are coarsely prominent interstitial markings throughout the lungs most likely chronic in nature.  An infectious or inflammatory process would be difficult to exclude however and follow-up chest x-ray is recommended to assess stability.  No pleural effusion is seen.  No adenopathy is noted.  The heart is within normal limits in size.  No bony abnormality is seen.  IMPRESSION: Coarse interstitial lung markings bilaterally.  Possibly chronic in nature but cannot exclude an infectious or inflammatory process. Recommend follow-up.  Original Report Authenticated By: Juline Patch, M.D.     1. Ureteral calculus, right   2. Bilateral kidney stones   3. Community acquired pneumonia   4. Hypokalemia       MDM  Pt with hx kidney stones, presents with symptoms he describes as similar to prior episodes. CT scan and pain medication ordered by provider in triage. Will continue to follow. CXR  ordered to eval continued SOB with hypoxia and a CXR from yesterday with possible infectious process and recommended follow-up.    7:40 PM CT scan with evidence of right ureteral calculus. Also multiple residual bilateral kidney stones. Mild hypokalemia on labs. CXR concerning for worsening infection. Although no focal consolidation visualized, pt hx of anti-rheumatic medications is concerning for atypical infection and given lack of response with avelox, we will start him on  IV antibiotics. He will be admitted for further pain control and continued treatment of his respiratory infection.       Shaaron Adler, [place] 12/01/11 2031

## 2011-12-01 NOTE — ED Notes (Signed)
Pt stated he cannot urinate at this time

## 2011-12-01 NOTE — ED Notes (Signed)
1st attempt made to call report to 4E; RN unavailable at this time. Left call back number; plan to call back in 5-10 minutes.

## 2011-12-01 NOTE — ED Provider Notes (Signed)
Medical screening examination/treatment/procedure(s) were conducted as a shared visit with non-physician practitioner(s) and myself.  I personally evaluated the patient during the encounter Pt with acute right flank pain - ureteral stone on ct. abd soft nt. Pt also notes recent fevers w temps to 103, prod cough, and slowly worsening sob. Pt has been on avelox x 5 days. todays cxr worse as compared to yesterday. w ambulating in ed, hypoxic w room air pulse ox 85%. Iv abx in ed. [facility] called to admit. [facility] requests urology consult - urology called to consult.   Suzi Roots, MD 12/01/11 2112

## 2011-12-01 NOTE — Progress Notes (Signed)
ANTIBIOTIC CONSULT NOTE - INITIAL  Pharmacy Consult for Vancomycin & Zosyn Indication: Suspected PNA  No Known Allergies  Patient Measurements: Height: 5\' 10"  (177.8 cm) Weight: 174 lb 9.7 oz (79.2 kg) IBW/kg (Calculated) : 73    Vital Signs: Temp: 97.5 F (36.4 C) (03/16 2333) Temp src: Oral (03/16 2333) BP: 133/73 mmHg (03/16 2333) Pulse Rate: 96  (03/16 2333) Intake/Output from previous day:   Intake/Output from this shift:    Labs:  [facility] 12/01/11 1817  WBC 7.4  HGB 15.4  PLT 240  LABCREA --  CREATININE 1.14   Estimated Creatinine Clearance: 82.7 ml/min (by C-G formula based on Cr of 1.14). No results found for this basename: VANCOTROUGH:2,VANCOPEAK:2,VANCORANDOM:2,GENTTROUGH:2,GENTPEAK:2,GENTRANDOM:2,TOBRATROUGH:2,TOBRAPEAK:2,TOBRARND:2,AMIKACINPEAK:2,AMIKACINTROU:2,AMIKACIN:2, in the last 72 hours   Microbiology: No results found for this or any previous visit (from the past 720 hour(s)).  Medical History: Past Medical History  Diagnosis Date  . Arthritis   . Asthma   . Allergic arthritis   . Renal disorder   . History of nephrolithiasis     Medications:  Scheduled:    . azelastine  1 spray Each Nare BID  . ezetimibe  10 mg Oral Daily  . fenofibrate  160 mg Oral Daily  . folic acid  1 mg Oral Daily  . furosemide  40 mg Intravenous Q12H  .  HYDROmorphone (DILAUDID) injection  1 mg Intravenous Once  .  HYDROmorphone (DILAUDID) injection  1 mg Intravenous Once  . ketorolac  30 mg Intravenous Once  . loratadine  10 mg Oral Daily  . metoCLOPramide (REGLAN) injection  10 mg Intravenous Once  . ondansetron  8 mg Oral Once  . pantoprazole  40 mg Oral Daily  . potassium chloride  40 mEq Oral Once  . sodium chloride  1,000 mL Intravenous Once  . sodium chloride  3 mL Intravenous Q12H  . DISCONTD: azithromycin  500 mg Intravenous Q24H  . DISCONTD: cefTRIAXone (ROCEPHIN)  IV  1 g Intravenous Q24H   Infusions:   Assessment: 46 year old male admitted  with biltareral nephrolithiasis.  Patient s/p outpatient treatment of pneumonia with Avelox.  Chest Xray today shows worsening infiltrate and IV Vancomycin and Zosyn to begin for suspected pneumonia.  Patient received initial Rocephin 1gm and Azithromycin 500mg  in ED.     Goal of Therapy:  Vancomycin trough level 15-20 mcg/ml  Plan:  1.  Zosyn 3.375gm IV q8h (extended infusion) 2.  Vancomycin 750mg  IV q8h 3.  Check vancomycin trough level as appropriate  Stuart Collins 12/01/2011,11:57 PM

## 2011-12-02 ENCOUNTER — Inpatient Hospital Stay (HOSPITAL_COMMUNITY): Payer: 59

## 2011-12-02 ENCOUNTER — Inpatient Hospital Stay (HOSPITAL_COMMUNITY): Payer: 59 | Admitting: Anesthesiology

## 2011-12-02 ENCOUNTER — Encounter (HOSPITAL_COMMUNITY)
Admission: EM | Disposition: A | Discharge status: Home / Self Care | Payer: Self-pay | Source: Home / Self Care | Attending: Internal Medicine

## 2011-12-02 ENCOUNTER — Encounter (HOSPITAL_COMMUNITY): Payer: Self-pay | Admitting: Anesthesiology

## 2011-12-02 DIAGNOSIS — J9600 Acute respiratory failure, unspecified whether with hypoxia or hypercapnia: Secondary | ICD-10-CM

## 2011-12-02 DIAGNOSIS — R519 Headache, unspecified: Secondary | ICD-10-CM | POA: Diagnosis present

## 2011-12-02 DIAGNOSIS — R51 Headache: Secondary | ICD-10-CM | POA: Diagnosis present

## 2011-12-02 DIAGNOSIS — M069 Rheumatoid arthritis, unspecified: Secondary | ICD-10-CM

## 2011-12-02 DIAGNOSIS — J189 Pneumonia, unspecified organism: Secondary | ICD-10-CM

## 2011-12-02 HISTORY — PX: CYSTOSCOPY W/ URETERAL STENT PLACEMENT: SHX1429

## 2011-12-02 LAB — PRO B NATRIURETIC PEPTIDE: Pro B Natriuretic peptide (BNP): 310.5 pg/mL — ABNORMAL HIGH (ref 0–125)

## 2011-12-02 LAB — BASIC METABOLIC PANEL
GFR calc Af Amer: 62 mL/min — ABNORMAL LOW (ref >90)
GFR calc non Af Amer: 53 mL/min — ABNORMAL LOW (ref >90)
Potassium: 3.8 mEq/L (ref 3.5–5.1)
Sodium: 138 mEq/L (ref 135–145)

## 2011-12-02 LAB — SEDIMENTATION RATE: Sed Rate: 61 mm/hr — ABNORMAL HIGH (ref 0–16)

## 2011-12-02 LAB — CBC
Hemoglobin: 14.3 g/dL (ref 13.0–17.0)
RBC: 4.3 MIL/uL (ref 4.22–5.81)

## 2011-12-02 LAB — MRSA PCR SCREENING: MRSA by PCR: NEGATIVE

## 2011-12-02 SURGERY — CYSTOSCOPY, WITH RETROGRADE PYELOGRAM AND URETERAL STENT INSERTION
Anesthesia: General | Site: Ureter | Laterality: Right | Wound class: Clean Contaminated

## 2011-12-02 MED ORDER — LIDOCAINE HCL (CARDIAC) 20 MG/ML IV SOLN
INTRAVENOUS | Status: DC | PRN
  Administered 2011-12-02: 75 mg via INTRAVENOUS

## 2011-12-02 MED ORDER — PIPERACILLIN-TAZOBACTAM 3.375 G IVPB
3.3750 g | Freq: Three times a day (TID) | INTRAVENOUS | Status: DC
  Administered 2011-12-02 (×2): 3.375 g via INTRAVENOUS
  Filled 2011-12-02 (×5): qty 50

## 2011-12-02 MED ORDER — DEXTROSE 5 % IV SOLN
1.0000 g | INTRAVENOUS | Status: DC
  Administered 2011-12-02 – 2011-12-03 (×2): 1 g via INTRAVENOUS
  Filled 2011-12-02 (×5): qty 10

## 2011-12-02 MED ORDER — LIDOCAINE HCL 2 % EX GEL
CUTANEOUS | Status: AC
  Filled 2011-12-02: qty 10

## 2011-12-02 MED ORDER — FENTANYL CITRATE 0.05 MG/ML IJ SOLN: 25.0000 ug | INTRAMUSCULAR | Status: DC | PRN

## 2011-12-02 MED ORDER — STERILE WATER FOR IRRIGATION IR SOLN
Status: DC | PRN
  Administered 2011-12-02: 3000 mL

## 2011-12-02 MED ORDER — LACTATED RINGERS IV SOLN
INTRAVENOUS | Status: DC | PRN
  Administered 2011-12-02: 19:00:00 via INTRAVENOUS

## 2011-12-02 MED ORDER — HYDROMORPHONE HCL PF 1 MG/ML IJ SOLN
INTRAMUSCULAR | Status: AC
  Administered 2011-12-02: 1 mg via INTRAVENOUS
  Filled 2011-12-02: qty 1

## 2011-12-02 MED ORDER — METHYLPREDNISOLONE SODIUM SUCC 125 MG IJ SOLR
80.0000 mg | Freq: Two times a day (BID) | INTRAMUSCULAR | Status: DC
  Administered 2011-12-02 (×2): 80 mg via INTRAVENOUS
  Filled 2011-12-02: qty 2
  Filled 2011-12-02 (×2): qty 1.28
  Filled 2011-12-02: qty 2
  Filled 2011-12-02: qty 1.28
  Filled 2011-12-02: qty 2
  Filled 2011-12-02: qty 1.28

## 2011-12-02 MED ORDER — HYDROMORPHONE HCL PF 1 MG/ML IJ SOLN
1.0000 mg | INTRAMUSCULAR | Status: DC | PRN
  Administered 2011-12-02 (×2): 1 mg via INTRAVENOUS
  Filled 2011-12-02: qty 1

## 2011-12-02 MED ORDER — KETOROLAC TROMETHAMINE 30 MG/ML IJ SOLN
30.0000 mg | Freq: Four times a day (QID) | INTRAMUSCULAR | Status: DC | PRN
  Administered 2011-12-02 – 2011-12-03 (×2): 30 mg via INTRAVENOUS
  Filled 2011-12-02 (×2): qty 1

## 2011-12-02 MED ORDER — BELLADONNA ALKALOIDS-OPIUM 16.2-60 MG RE SUPP
RECTAL | Status: DC | PRN
  Administered 2011-12-02: 1 via RECTAL

## 2011-12-02 MED ORDER — LACTATED RINGERS IV SOLN: INTRAVENOUS | Status: DC

## 2011-12-02 MED ORDER — PANTOPRAZOLE SODIUM 40 MG PO TBEC: 40.0000 mg | DELAYED_RELEASE_TABLET | Freq: Every day | ORAL | Status: DC

## 2011-12-02 MED ORDER — IOHEXOL 300 MG/ML  SOLN
INTRAMUSCULAR | Status: DC | PRN
  Administered 2011-12-02: 6 mL

## 2011-12-02 MED ORDER — SODIUM CHLORIDE 0.45 % IV SOLN
INTRAVENOUS | Status: DC
  Administered 2011-12-02 – 2011-12-03 (×2): via INTRAVENOUS

## 2011-12-02 MED ORDER — PANTOPRAZOLE SODIUM 40 MG PO TBEC
40.0000 mg | DELAYED_RELEASE_TABLET | Freq: Two times a day (BID) | ORAL | Status: DC
  Administered 2011-12-03 – 2011-12-04 (×3): 40 mg via ORAL
  Filled 2011-12-02 (×7): qty 1

## 2011-12-02 MED ORDER — IOHEXOL 300 MG/ML  SOLN
INTRAMUSCULAR | Status: AC
  Filled 2011-12-02: qty 1

## 2011-12-02 MED ORDER — SUCCINYLCHOLINE CHLORIDE 20 MG/ML IJ SOLN
INTRAMUSCULAR | Status: DC | PRN
  Administered 2011-12-02: 100 mg via INTRAVENOUS

## 2011-12-02 MED ORDER — ACETAMINOPHEN 325 MG PO TABS
650.0000 mg | ORAL_TABLET | ORAL | Status: DC | PRN
  Administered 2011-12-02: 650 mg via ORAL
  Filled 2011-12-02: qty 1

## 2011-12-02 MED ORDER — ONDANSETRON HCL 4 MG/2ML IJ SOLN
INTRAMUSCULAR | Status: DC | PRN
  Administered 2011-12-02: 4 mg via INTRAVENOUS

## 2011-12-02 MED ORDER — BELLADONNA ALKALOIDS-OPIUM 16.2-60 MG RE SUPP
RECTAL | Status: AC
  Filled 2011-12-02: qty 1

## 2011-12-02 MED ORDER — SALINE SPRAY 0.65 % NA SOLN
2.0000 | NASAL | Status: DC | PRN
  Filled 2011-12-02: qty 44

## 2011-12-02 MED ORDER — PROPOFOL 10 MG/ML IV BOLUS
INTRAVENOUS | Status: DC | PRN
  Administered 2011-12-02: 200 mg via INTRAVENOUS

## 2011-12-02 MED ORDER — VANCOMYCIN HCL 1000 MG IV SOLR
750.0000 mg | Freq: Three times a day (TID) | INTRAVENOUS | Status: DC
  Administered 2011-12-02 (×2): 750 mg via INTRAVENOUS
  Filled 2011-12-02 (×5): qty 750

## 2011-12-02 MED ORDER — FENTANYL CITRATE 0.05 MG/ML IJ SOLN
INTRAMUSCULAR | Status: DC | PRN
  Administered 2011-12-02: 100 ug via INTRAVENOUS

## 2011-12-02 MED ORDER — ACETAMINOPHEN 325 MG PO TABS
ORAL_TABLET | ORAL | Status: AC
  Filled 2011-12-02: qty 2

## 2011-12-02 SURGICAL SUPPLY — 15 items
ADAPTER CATH URET PLST 4-6FR (CATHETERS) ×2 IMPLANT
ADPR CATH URET STRL DISP 4-6FR (CATHETERS) ×1
BAG URO CATCHER STRL LF (DRAPE) ×2 IMPLANT
CATH INTERMIT  6FR 70CM (CATHETERS) ×2 IMPLANT
CLOTH BEACON ORANGE TIMEOUT ST (SAFETY) ×2 IMPLANT
DRAPE CAMERA CLOSED 9X96 (DRAPES) ×2 IMPLANT
GLOVE BIOGEL M STRL SZ7.5 (GLOVE) ×2 IMPLANT
GOWN STRL NON-REIN LRG LVL3 (GOWN DISPOSABLE) ×2 IMPLANT
GOWN STRL REIN XL XLG (GOWN DISPOSABLE) ×2 IMPLANT
GUIDEWIRE STR DUAL SENSOR (WIRE) ×2 IMPLANT
MANIFOLD NEPTUNE II (INSTRUMENTS) ×2 IMPLANT
NS IRRIG 1000ML POUR BTL (IV SOLUTION) ×2 IMPLANT
PACK CYSTO (CUSTOM PROCEDURE TRAY) ×2 IMPLANT
SCRUB PCMX 4 OZ (MISCELLANEOUS) IMPLANT
TUBING CONNECTING 10 (TUBING) ×2 IMPLANT

## 2011-12-02 NOTE — Op Note (Signed)
Pre-operative diagnosis : Urosepsis and Right upper ureteral stone with obstruction and hydronephrosis  Postoperative diagnosis:Same  Operation: Cystoscopy, Right retrograde pyelogram and Right JJ stent ( 60F x 24 cm)  Surgeon:  S. Patsi Sears, MD  First assistant:None  Anesthesia:  general  Preparation: After appropriate pre-anesthesia, the patient was brought to the OR and placed upon the table in supine position. The Right arm had previously been marked. He then underwent general LMA anesthesia, and was replaced in the dorsal lithotomy position. The pubis was then prepped with betadine solution and draped in the usual manner.   Review history:  46 yo male with hx of recurrent nephrolithiasis and asthma, now with urosepsis 2ndary 6mm R upper ureteral stone with obstruction and hydronephrosis. Stabilized in the ICU, and now for JJ stent placement.   Statement of  Likelihood of Success: Excellent. TIME-OUT observed.:  Procedure:  Cystoscopy shows normal urethral meatus, and normal pendulous and posterior urethra. The bladder neck and prostate are normal. The trigone is normal. The L ureteral orifice is normal, with clear efflux seen. The Right orifice is seen in normal position, and no urine is observed to emit from the orifice. Right retrograde pyelogram shows proximal hydronephrosis, and  A 6mm stone is seen in the Right upper ureter, just distal to the R UPJ. An .038 Guidewire is passed around the stone, and a 60F x 24cm JJ stent is passed and coiled in the R renal pelvis and in the bladder. Photodocumentation of the stone is obtained.    The patient had B/O suppository and was awakened and taken to the recovery room in good condition.

## 2011-12-02 NOTE — Transfer of Care (Signed)
Immediate Anesthesia Transfer of Care Note  Patient: Stuart Collins  Procedure(s) Performed: Procedure(s) (LRB): CYSTOSCOPY WITH RETROGRADE PYELOGRAM/URETERAL STENT PLACEMENT (Right)  Patient Location: PACU  Anesthesia Type: General  Level of Consciousness: awake, alert , oriented and patient cooperative  Airway & Oxygen Therapy: Patient Spontanous Breathing and Patient connected to face mask oxygen  Post-op Assessment: Report given to PACU RN and Post -op Vital signs reviewed and stable  Post vital signs: Reviewed and stable  Complications: No apparent anesthesia complications

## 2011-12-02 NOTE — Progress Notes (Signed)
  Echocardiogram 2D Echocardiogram has been performed.  [name] [facility] 12/02/2011, 8:52 AM

## 2011-12-02 NOTE — ED Provider Notes (Signed)
Medical screening examination/treatment/procedure(s) were conducted as a shared visit with non-physician practitioner(s) and myself.  I personally evaluated the patient during the encounter Pt with flank pain. Ct c/w ureteral stone. Also with recent non prod cough, congestion, fevers to 103. Has been on avelox x 5 days. Pt hypoxic with minimal activity/walking to bathroom, worsening infiltrate on cxr - will admit.   Suzi Roots, MD 12/02/11 [phone]

## 2011-12-02 NOTE — Anesthesia Preprocedure Evaluation (Addendum)
Anesthesia Evaluation  Patient identified by MRN, date of birth, ID band Patient awake    Reviewed: Allergy & Precautions, H&P , NPO status , Patient's Chart, lab work & pertinent test results  Airway Mallampati: II TM Distance: >3 FB Neck ROM: full    Dental No notable dental hx. (+) Teeth Intact and Dental Advisory Given   Pulmonary asthma , pneumonia ,  Placed in ICU with diagnosis of oxygen dependent respiratory failure.  Possible atypical pneumonia versus inflammation and interstitial disease based on CXR done today. The cxr from two days ago said that this could be chronic interstitial disease instead of an acute process.   Moderate to severe asthma. breath sounds clear to auscultation  Pulmonary exam normal       Cardiovascular Exercise Tolerance: Good negative cardio ROS  Rhythm:regular Rate:Normal  Normal echo 3/17 to rule out CHF as cause of low sats   Neuro/Psych  Headaches, negative neurological ROS  negative psych ROS   GI/Hepatic negative GI ROS, Neg liver ROS, GERD-  Medicated and Controlled,  Endo/Other  negative endocrine ROS  Renal/GU negative Renal ROS  negative genitourinary   Musculoskeletal   Abdominal   Peds  Hematology negative hematology ROS (+)   Anesthesia Other Findings   Reproductive/Obstetrics negative OB ROS                        Anesthesia Physical Anesthesia Plan  ASA: III  Anesthesia Plan: General   Post-op Pain Management:    Induction: Intravenous  Airway Management Planned: Oral ETT  Additional Equipment:   Intra-op Plan:   Post-operative Plan: Extubation in OR  Informed Consent: I have reviewed the patients History and Physical, chart, labs and discussed the procedure including the risks, benefits and alternatives for the proposed anesthesia with the patient or authorized representative who has indicated his/her understanding and acceptance.    Dental Advisory Given  Plan Discussed with: CRNA and Surgeon  Anesthesia Plan Comments:        Anesthesia Quick Evaluation

## 2011-12-02 NOTE — ED Provider Notes (Signed)
Medical screening examination/treatment/procedure(s) were performed by non-physician practitioner and as supervising physician I was immediately available for consultation/collaboration.    Latishia Suitt L Shaughnessy Gethers, MD 12/02/11 0938 

## 2011-12-02 NOTE — Consult Note (Addendum)
Name: Stuart Collins MRN: [id number] DOB: 03-Dec-1965    LOS: 1  [place] [facility]   Brief patient profile:  45 yowm never regular smoker longstanding allergic rhinitis/asthma followed by [facility] [place] on advair and RA on MTX followed by [facility] sick since early Feb 2013 with worsening cough and sinus symptoms and doe rx as sinusitis/bronchitis with multiple abx including avelox on admit 3/16 with R flank pain dx with nepholithiasis/ Ild/ acute resp failure with temp spike to 103 3/17 and worsening resp distress so moved to ICU and pccm consulted at that point by [facility].     Micro/sepsis markers:  MRSA 3/17 > neg BCx 2 3/16 >>> Procalcitonin 3/17 >>>  Antibiotics: Zosyn (hcap) 3/17 >>> Vanc (hcap) 3/17 >>>  Tests / Events: CT Abd 3/16 > Partially obstructing calculus at the right ureteropelvic  junction with mild pelvicaliectasis and hydroureter.  2. Bilateral nephrolithiasis.  3. Diffuse ground-glass opacity lung bases representing pulmonary  edema or diffuse infection or hemorrhage. Echo 3/17 > Study does not show structural cardiac abnormalities and echo findings suggest normal left and right heart filling pressure.   Subjective: Feeling better p arrival in ICU and placed on ventimask. C/o dry hacking cough dating all the way back to Dec 2012 No pleuritic cp and no flare of RA symptoms Nosebleeds since admit and placed on nasal 02    Also denies any obvious fluctuation of symptoms with weather or environmental changes or other aggravating or alleviating factors except as outlined above   ROS  At present neg for  any significant sore throat, dysphagia, itching, sneezing,  nasal congestion or excess/ purulent secretions,unintended wt loss, pleuritic or exertional cp, hempoptysis, orthopnea pnd or leg swelling.  Also denies presyncope, palpitations, heartburn,nausea, vomiting, diarrhea  or change in bowel or urinary habits, dysuria,hematuria,  rash, arthralgias, visual complaints,  headache, numbness weakness or ataxia.        Past Medical History  Diagnosis Date  . Arthritis   . Asthma   . Allergic arthritis   . Renal disorder   . History of nephrolithiasis    History reviewed. No pertinent past surgical history. Prior to Admission medications   Medication Sig Start Date End Date Taking? Authorizing Provider  Ascorbic Acid (VITAMIN C PO) Take 1 tablet by mouth daily.   Yes Historical Provider, MD  aspirin EC 81 MG tablet Take 81 mg by mouth daily.   Yes Historical Provider, MD  azelastine (ASTELIN) 137 MCG/SPRAY nasal spray Place 1 spray into the nose 2 (two) times daily. Use in each nostril as directed   Yes Historical Provider, MD  ezetimibe (ZETIA) 10 MG tablet Take 10 mg by mouth daily.   Yes Historical Provider, MD  fenofibrate 160 MG tablet Take 160 mg by mouth daily.   Yes Historical Provider, MD  fexofenadine (ALLEGRA) 180 MG tablet Take 180 mg by mouth daily.   Yes Historical Provider, MD  fluticasone (FLONASE) 50 MCG/ACT nasal spray Place 2 sprays into the nose daily.   Yes Historical Provider, MD  fluticasone-salmeterol (ADVAIR HFA) 115-21 MCG/ACT inhaler Inhale 2 puffs into the lungs 2 (two) times daily.   Yes Historical Provider, MD  folic acid (FOLVITE) 1 MG tablet Take 1 mg by mouth daily.   Yes Historical Provider, MD  HYDROcodone-acetaminophen (NORCO) 5-325 MG per tablet 1 tablet every 6 (six) hours as needed. Take 1-2 tablets every 6 hours as needed for severe pain 11/27/11 12/07/11 Yes Renne Crigler, PA  ibuprofen (ADVIL,MOTRIN) 200 MG tablet  Take 200 mg by mouth every 6 (six) hours as needed. Pain.   Yes Historical Provider, MD  moxifloxacin (AVELOX) 400 MG tablet Take 400 mg by mouth daily.   Yes Historical Provider, MD  Multiple Vitamin (MULITIVITAMIN WITH MINERALS) TABS Take 1 tablet by mouth daily.   Yes Historical Provider, MD  pantoprazole (PROTONIX) 40 MG tablet Take 40 mg by mouth daily.   Yes Historical Provider, MD  ramelteon (ROZEREM)  8 MG tablet Take 8 mg by mouth daily as needed. For sleep   Yes Historical Provider, MD  Abatacept (ORENCIA) 125 MG/ML SOLN Inject 125 mg into the skin once a week. On sundays    Historical Provider, MD  methotrexate (RHEUMATREX) 2.5 MG tablet Take 7.5 mg by mouth 2 (two) times a week. Takes on Mondays and Tuesdays each week.    Historical Provider, MD  Pseudoephedrine-Ibuprofen (ADVIL COLD & SINUS LIQUI-GELS PO) Take 1 capsule by mouth daily as needed. For allergies    Historical Provider, MD   Allergies No Known Allergies  Family History Family History  Problem Relation Age of Onset  . Hypertension    . Coronary artery disease      Social History  reports that he has quit smoking. He does not have any smokeless tobacco history on file. He reports that he drinks alcohol. He reports that he does not use illicit drugs. only smoked socially in college    Vital Signs: Temp:  [97.5 F (36.4 C)-103.1 F (39.5 C)] 103.1 F (39.5 C) (03/17 1131) Pulse Rate:  [83-113] 111  (03/17 1200) Resp:  [20-32] 24  (03/17 1200) BP: (112-146)/(72-81) 136/72 mmHg (03/17 1200) SpO2:  [91 %-98 %] 96 % (03/17 1200) FiO2 (%):  [50 %] 50 % (03/17 1200) Weight:  [174 lb 9.7 oz (79.2 kg)-175 lb (79.379 kg)] 174 lb 9.7 oz (79.2 kg) (03/16 2333)  Intake/Output Summary (Last 24 hours) at 12/02/11 1358 Last data filed at 12/02/11 1219  Gross per 24 hour  Intake    308 ml  Output   1200 ml  Net   -892 ml     Physical Examination: General: anxious but alert, mod increased wob  Neuro:  Alert and approp no def HEENT:  PERRL, pink conjunctivae, moist membranes Neck:  Supple, no JVD   Cardiovascular:  RRR, no M/R/G Lungs:  Bilateral insp crackles, no wheeze Abdomen:  Soft, nontender, nondistended, bowel sounds present Musculoskeletal:  Moves all extremities, no pedal edema Skin:  No rash MS redness symmetric all the ext surfaces of  Joints all fingers    Labs    Lab 12/02/11 0556 12/01/11 1817  11/27/11 1200  NA 138 137 139  K 3.8 3.3* 4.1  CL 102 99 106  CO2 26 26 26   BUN 14 12 12   CREATININE 1.51* 1.14 1.27  GLUCOSE 93 95 90    Lab 12/02/11 0556 12/01/11 1817 11/27/11 1200  HGB 14.3 15.4 15.7  HCT 41.0 42.7 44.5  WBC 8.7 7.4 8.3  PLT 246 240 164             ASSESSMENT AND PLAN    PULMONARY No results found for this basename: PHART:5,PCO2:5,PCO2ART:5,PO2ART:5,HCO3:5,O2SAT:5 in the last 168 hours A:  Acute 02 dep resp failure in pt with obst R Kidney on MTX with RA  ddx  MTX lung > d/cd on admit ALI secondary to UTI/sepsis RA lung dz less likely with no flare of chronic arthitic complaints for months. ESR 61 3/17 > solumedrol started  CARDIOVASCULAR  Lab 12/02/11 0556  TROPONINI --  LATICACIDVEN --  PROBNP 310.5*   A: no evidence of chf   RENAL  Lab 12/02/11 0556 12/01/11 1817 11/27/11 1200  NA 138 137 139  K 3.8 3.3* --  CL 102 99 106  CO2 26 26 26   BUN 14 12 12   CREATININE 1.51* 1.14 1.27  CALCIUM 9.2 9.5 8.6  MG -- -- --  PHOS -- -- --   A:  Mild acute renal failure with R neprholithiasis Urology to see D/c further diuretics for now          INFECTIOUS  Lab 12/02/11 0556 12/01/11 1817 11/27/11 1200  WBC 8.7 7.4 8.3  PROCALCITON -- -- --   A ? uti sepsis, strongly doubt pna See dashboard > if procalitonin neg would d/c Z/V 3/17    BEST PRACTICE / DISPOSITION -->  ICU status under PCCM -->  Full code --> PAS for DVT Px as having nosebleeds and can't r/o Alv hem -->  Protonix po for GI Px  -->  Family updated at bedside  The patient is critically ill with multiple organ systems failure and requires high complexity decision making for assessment and support, frequent evaluation and titration of therapies, application of advanced monitoring technologies and extensive interpretation of multiple databases. Critical Care Time devoted to patient care services described in this note is 60 minutes.  Sandrea Hughs,  MD Pulmonary and Critical Care Medicine [facility] Cell [phone]  If no answer call [phone]   12/02/2011, 1:58 PM

## 2011-12-02 NOTE — Progress Notes (Signed)
Subjective: Still having headaches. Dyspnea on excertion. patient desaturates when talking.  Objective: Filed Vitals:   12/01/11 1930 12/01/11 2240 12/01/11 2333 12/02/11 0550  BP:  112/72 133/73 137/81  Pulse:  83 96 89  Temp:  98.4 F (36.9 C) 97.5 F (36.4 C) 98.7 F (37.1 C)  TempSrc:  Oral Oral Oral  Resp:  23 20 20   Height:   5\' 10"  (1.778 m)   Weight:   79.2 kg (174 lb 9.7 oz)   SpO2: 94% 94% 91% 93%   Weight change:   Intake/Output Summary (Last 24 hours) at 12/02/11 1049 Last data filed at 12/02/11 0551  Gross per 24 hour  Intake    204 ml  Output   1200 ml  Net   -996 ml    General: Alert, awake, oriented x3, no able to speak in full sentences. HEENT: No bruits, no goiter.  Heart: Regular rate and rhythm, without murmurs, rubs, gallops.  Lungs: goos air movement crackles diffuse. Abdomen: Soft, nontender, nondistended, positive bowel sounds.  Neuro: Grossly intact, nonfocal.   Lab Results:  [facility] 12/02/11 0556 12/01/11 1817  NA 138 137  K 3.8 3.3*  CL 102 99  CO2 26 26  GLUCOSE 93 95  BUN 14 12  CREATININE 1.51* 1.14  CALCIUM 9.2 9.5  MG -- --  PHOS -- --   No results found for this basename: AST:2,ALT:2,ALKPHOS:2,BILITOT:2,PROT:2,ALBUMIN:2 in the last 72 hours No results found for this basename: LIPASE:2,AMYLASE:2 in the last 72 hours  Basename 12/02/11 0556 12/01/11 1817  WBC 8.7 7.4  NEUTROABS -- --  HGB 14.3 15.4  HCT 41.0 42.7  MCV 95.3 94.1  PLT 246 240   No results found for this basename: CKTOTAL:3,CKMB:3,CKMBINDEX:3,TROPONINI:3 in the last 72 hours No components found with this basename: POCBNP:3 No results found for this basename: DDIMER:2 in the last 72 hours No results found for this basename: HGBA1C:2 in the last 72 hours No results found for this basename: CHOL:2,HDL:2,LDLCALC:2,TRIG:2,CHOLHDL:2,LDLDIRECT:2 in the last 72 hours No results found for this basename: TSH,T4TOTAL,FREET3,T3FREE,THYROIDAB in the last 72 hours No  results found for this basename: VITAMINB12:2,FOLATE:2,FERRITIN:2,TIBC:2,IRON:2,RETICCTPCT:2 in the last 72 hours  Micro Results: No results found for this or any previous visit (from the past 240 hour(s)).  Studies/Results: Ct Abdomen Pelvis Wo Contrast  12/01/2011  *RADIOLOGY REPORT*  Clinical Data: The right kidney stone, right flank pain  CT ABDOMEN AND PELVIS WITHOUT CONTRAST  Technique:  Multidetector CT imaging of the abdomen and pelvis was performed following the standard protocol without intravenous contrast.  Comparison: None.  Findings: There is diffuse ground-glass opacities at the lung bases and interlobular septal thickening.  No pericardial fluid.  Non-IV contrast images demonstrate no focal hepatic lesion.  The gallbladder, pancreas, spleen, adrenal glands normal.  There are four nonobstructing calculi within the right kidney. There is mild pelvicaliectasis on the right and hydroureter on the right.  This secondary to a partially obstructing calculus at the right ureteral pelvic junction measuring 5 mm (image 44).  There are 6 calculi within the left kidney ranging size from 2-4 mm.  No evidence of left ureteral lithiasis.  The stomach, small bowel, and colon are normal.  Abdominal aorta normal caliber.  No retroperitoneal periportal lymphadenopathy.  No free fluid the pelvis.  No distal ureteral stones or bladder stones.  The prostate gland is normal.  No pelvic lymphadenopathy. Review of  bone windows demonstrates no aggressive osseous lesions.  IMPRESSION:  1.  Partially obstructing calculus at  the right ureteropelvic junction with mild pelvicaliectasis and hydroureter. 2.  Bilateral nephrolithiasis. 3.  Diffuse ground-glass opacity lung bases representing pulmonary edema or diffuse infection or hemorrhage.  Original Report Authenticated By: Genevive Bi, M.D.   Dg Sinuses Complete  11/30/2011  *RADIOLOGY REPORT*  Clinical Data: Cough, fever for 5 days, smoking history  PARANASAL  SINUSES - COMPLETE 3 + VIEW  Comparison: None.  Findings: The paranasal sinuses are well pneumatized.  There is no evidence of sinusitis.  No bony abnormality is seen.  IMPRESSION: No sinusitis.  Original Report Authenticated By: Juline Patch, M.D.   Dg Chest 2 View  12/01/2011  *RADIOLOGY REPORT*  Clinical Data: Shortness of breath, hypoxia  CHEST - 2 VIEW  Comparison: 11/30/2011  Findings: Cardiomegaly again noted.  There is worsening interstitial prominence bilateral perihilar and especially lower lobes.  Findings suspicious for developing edema or pneumonitis. No segmental infiltrate is noted.  IMPRESSION: Worsening interstitial prominence bilateral perihilar and bilateral lower lobe suspicious for developing edema or pneumonitis.  No focal infiltrate.  Original Report Authenticated By: Natasha Mead, M.D.   Dg Chest 2 View  11/30/2011  *RADIOLOGY REPORT*  Clinical Data: , fever for 5 days, smoking history  CHEST - 2 VIEW  Comparison: None.  Findings: There are coarsely prominent interstitial markings throughout the lungs most likely chronic in nature.  An infectious or inflammatory process would be difficult to exclude however and follow-up chest x-ray is recommended to assess stability.  No pleural effusion is seen.  No adenopathy is noted.  The heart is within normal limits in size.  No bony abnormality is seen.  IMPRESSION: Coarse interstitial lung markings bilaterally.  Possibly chronic in nature but cannot exclude an infectious or inflammatory process. Recommend follow-up.  Original Report Authenticated By: Juline Patch, M.D.   Dg Chest Port 1 View  12/02/2011  *RADIOLOGY REPORT*  Clinical Data: Pneumonia  PORTABLE CHEST - 1 VIEW  Comparison: Chest radiograph 12/01/2011  Findings: Stable cardiac silhouette.  There is diffuse air space disease as well as linear interstitial thickening of the interstitial thickening appears slightly increased.  No pneumothorax.  IMPRESSION: Increased interstitial  thickening and air space disease.  Differential includes atypical pneumonia versus inflammatory process.  Original Report Authenticated By: Genevive Bi, M.D.    Medications: I have reviewed the patient's current medications.  Assessment and plan: Principal Problem: -Acute respiratory failure: At this time the patient continued to desaturate on 6 L. He cannot speak in full sentences. Every time he tries to talk he desaturates to 82% on 6 L. We'll transfer him to the step down unit we'll put him on a Venturi mask. Consulted pulmonary.   -Pneumonitis: At this time will stop the vancomycin and Zosyn, I don't think is an infectious process. He doesn't have a white count doesn't have a fever. He has got a full course of Avelox one week prior to admission. He continued to desaturate when he coughs and he not able to begin full sentences. We'll transfer him to the step down unit we'll put on a Venturi mask. We'll stop his methotrexate. And start him on Solu-Medrol high dose, we'll start him on Protonix for GI prophylaxis. His BNP is 300 and his ESR is 61.chest x-ray showed interstitial prominence bilateral perihilar and bilateral lower lobe. We'll get pulmonary involved to take over the case and we'll continue to monitor his status closely. At this time his cultures are pending.  Patient will need PFTs. To try to rule  out rheumatoid associated lung disease.  -Nephrolithiasis/Hydroureter At this time he doesn't have CVA tenderness. A CT scan of the abdomen and pelvis from 3/16 showedPartially obstructing calculus at the right ureteropelvic, junction with mild pelvicaliectasis and hydroureter.  Bilateral nephrolithiasis. [facility] consult urology.  -Arthritis He increase his rheumatoid as he had a couple of flares in the last year.  -Headaches: He relates his headaches have not changed. He relates it seems like the previous episodes he's had in the past. He doesn't have a white count doesn't have a fever,  the likelihood of an infectious process in the central nervous system is unlikely. If this fracture continued to worsen we'll have to get a CT scan of the head.   LOS: 1 day   Marinda Elk M.D. Pager: [phone] [facility] 12/02/2011, 10:49 AM

## 2011-12-02 NOTE — Anesthesia Postprocedure Evaluation (Signed)
  Anesthesia Post-op Note  Patient: Stuart Collins  Procedure(s) Performed: Procedure(s) (LRB): CYSTOSCOPY WITH RETROGRADE PYELOGRAM/URETERAL STENT PLACEMENT (Right)  Patient Location: PACU  Anesthesia Type: General  Level of Consciousness: awake and alert   Airway and Oxygen Therapy: Patient Spontanous Breathing  Post-op Pain: mild  Post-op Assessment: Post-op Vital signs reviewed, Patient's Cardiovascular Status Stable, Respiratory Function Stable, Patent Airway and No signs of Nausea or vomiting  Post-op Vital Signs: stable  Complications: No apparent anesthesia complications

## 2011-12-02 NOTE — Progress Notes (Signed)
Pt transferred to step-down unit, Room 1223, per MD order. Pt increasingly SOB and O2 sats dropping into 70s with activity, when talking, and at rest. Pt on 4 l/min O2 via [place] this am and has had to increase to O2 via venti mask at 50% by respiratory, to keep sats in 90s. Report given by writer to nurse in ICU, Amy.

## 2011-12-02 NOTE — Consult Note (Signed)
Urology Consult   Physician requesting consult: Dr. Sherene Sires  Reason for consult: Urolithiasis  History of Present Illness: Stuart Collins is a 46 y.o. with a history of urolithiasis and has passed about 3-4 stones in the past but has never required surgical intervention for his stones. He presented to the hospital yesterday with severe right flank pain with radiation to his RLQ consistent with his prior stone episodes.  He was admitted by [facility] but developed worsening respiratory distress resulting in transfer to the ICU and is now on the Critical Care Service.  A CT scan was performed on 12/01/11 which demonstrated bilateral renal calculi and a 6 mm proximal right ureteral stone.  His urinalysis demonstrated findings consistent with an infection but there was no culture obtained from the urine. He was initially started on antibiotics with Vancomycin and Zosyn but have been stopped.  He has been febrile up to 103.  He ate lunch about 4 hrs ago and has been drinking water this afternoon.    Past Medical History  Diagnosis Date  . Arthritis   . Asthma   . Allergic arthritis   .    Marland Kitchen History of nephrolithiasis     PSH: Tonsillectomy   Current Hospital Medications:  Prior to Admission medications   Medication Sig Start Date End Date Taking? Authorizing Provider  Ascorbic Acid (VITAMIN C PO) Take 1 tablet by mouth daily.   Yes Historical Provider, MD  aspirin EC 81 MG tablet Take 81 mg by mouth daily.   Yes Historical Provider, MD  azelastine (ASTELIN) 137 MCG/SPRAY nasal spray Place 1 spray into the nose 2 (two) times daily. Use in each nostril as directed   Yes Historical Provider, MD  ezetimibe (ZETIA) 10 MG tablet Take 10 mg by mouth daily.   Yes Historical Provider, MD  fenofibrate 160 MG tablet Take 160 mg by mouth daily.   Yes Historical Provider, MD  fexofenadine (ALLEGRA) 180 MG tablet Take 180 mg by mouth daily.   Yes Historical Provider, MD  fluticasone (FLONASE) 50  MCG/ACT nasal spray Place 2 sprays into the nose daily.   Yes Historical Provider, MD  fluticasone-salmeterol (ADVAIR HFA) 115-21 MCG/ACT inhaler Inhale 2 puffs into the lungs 2 (two) times daily.   Yes Historical Provider, MD  folic acid (FOLVITE) 1 MG tablet Take 1 mg by mouth daily.   Yes Historical Provider, MD  HYDROcodone-acetaminophen (NORCO) 5-325 MG per tablet 1 tablet every 6 (six) hours as needed. Take 1-2 tablets every 6 hours as needed for severe pain 11/27/11 12/07/11 Yes Renne Crigler, PA  ibuprofen (ADVIL,MOTRIN) 200 MG tablet Take 200 mg by mouth every 6 (six) hours as needed. Pain.   Yes Historical Provider, MD  moxifloxacin (AVELOX) 400 MG tablet Take 400 mg by mouth daily.   Yes Historical Provider, MD  Multiple Vitamin (MULITIVITAMIN WITH MINERALS) TABS Take 1 tablet by mouth daily.   Yes Historical Provider, MD  pantoprazole (PROTONIX) 40 MG tablet Take 40 mg by mouth daily.   Yes Historical Provider, MD  ramelteon (ROZEREM) 8 MG tablet Take 8 mg by mouth daily as needed. For sleep   Yes Historical Provider, MD  Abatacept (ORENCIA) 125 MG/ML SOLN Inject 125 mg into the skin once a week. On sundays    Historical Provider, MD  methotrexate (RHEUMATREX) 2.5 MG tablet Take 7.5 mg by mouth 2 (two) times a week. Takes on Mondays and Tuesdays each week.    Historical Provider, MD  Pseudoephedrine-Ibuprofen (ADVIL  COLD & SINUS LIQUI-GELS PO) Take 1 capsule by mouth daily as needed. For allergies    Historical Provider, MD    Scheduled Meds:   . acetaminophen      . azelastine  1 spray Each Nare BID  . cefTRIAXone (ROCEPHIN)  IV  1 g Intravenous Q24H  . ezetimibe  10 mg Oral Daily  . fenofibrate  160 mg Oral Daily  . folic acid  1 mg Oral Daily  . furosemide  40 mg Intravenous Q12H  .  HYDROmorphone (DILAUDID) injection  1 mg Intravenous Once  .  HYDROmorphone (DILAUDID) injection  1 mg Intravenous Once  . ketorolac  30 mg Intravenous Once  . loratadine  10 mg Oral Daily  .  methylPREDNISolone (SOLU-MEDROL) injection  80 mg Intravenous Q12H  . metoCLOPramide (REGLAN) injection  10 mg Intravenous Once  . pantoprazole  40 mg Oral BID AC  . potassium chloride  40 mEq Oral Once  . sodium chloride  1,000 mL Intravenous Once  . sodium chloride  3 mL Intravenous Q12H  . DISCONTD: azithromycin  500 mg Intravenous Q24H  . DISCONTD: cefTRIAXone (ROCEPHIN)  IV  1 g Intravenous Q24H  . DISCONTD: pantoprazole  40 mg Oral Daily  . DISCONTD: pantoprazole  40 mg Oral Q1200  . DISCONTD: piperacillin-tazobactam (ZOSYN)  IV  3.375 g Intravenous Q8H  . DISCONTD: vancomycin  750 mg Intravenous Q8H   Continuous Infusions:  PRN Meds:.sodium chloride, acetaminophen, albuterol, benzonatate, HYDROcodone-acetaminophen, HYDROmorphone (DILAUDID) injection, ramelteon, sodium chloride, sodium chloride, DISCONTD: ipratropium  Allergies: No Known Allergies  Family History  Problem Relation Age of Onset  . Hypertension    . Coronary artery disease      Social History:  reports that he has quit smoking. He does not have any smokeless tobacco history on file. He reports that he drinks alcohol. He reports that he does not use illicit drugs.  ROS: A complete review of systems was performed.  All systems are negative except for pertinent findings as noted.  Physical Exam:  Vital signs in last 24 hours: Temp:  [97.5 F (36.4 C)-103.1 F (39.5 C)] 98.8 F (37.1 C) (03/17 1600) Pulse Rate:  [83-113] 88  (03/17 1600) Resp:  [20-32] 28  (03/17 1600) BP: (112-146)/(71-81) 130/71 mmHg (03/17 1600) SpO2:  [91 %-98 %] 92 % (03/17 1600) FiO2 (%):  [50 %] 50 % (03/17 1200) Weight:  [79.2 kg (174 lb 9.7 oz)] 79.2 kg (174 lb 9.7 oz) (03/16 2333) General:  Alert and oriented, No acute distress HEENT: Normocephalic, atraumatic Neck: No JVD or lymphadenopathy Cardiovascular: Regular rate and rhythm Lungs: Clear bilaterally Abdomen: Soft, nontender, nondistended, no abdominal masses Back: Mild  right CVAT Extremities: No edema Neurologic: Grossly intact  Laboratory Data:   Basename 12/02/11 0556 12/01/11 1817  WBC 8.7 7.4  HGB 14.3 15.4  HCT 41.0 42.7  PLT 246 240   Urinalysis     Basename 12/02/11 0556 12/01/11 1817  NA 138 137  K 3.8 3.3*  CL 102 99  GLUCOSE 93 95  BUN 14 12  CALCIUM 9.2 9.5  CREATININE 1.51* 1.14     Results for orders placed during the hospital encounter of 12/01/11 (from the past 24 hour(s))  CBC     Status: Abnormal   Collection Time   12/01/11  6:17 PM      Component Value Range   WBC 7.4  4.0 - 10.5 (K/uL)   RBC 4.54  4.22 - 5.81 (MIL/uL)  Hemoglobin 15.4  13.0 - 17.0 (g/dL)   HCT 16.1  09.6 - 04.5 (%)   MCV 94.1  78.0 - 100.0 (fL)   MCH 33.9  26.0 - 34.0 (pg)   MCHC 36.1 (*) 30.0 - 36.0 (g/dL)   RDW 40.9  81.1 - 91.4 (%)   Platelets 240  150 - 400 (K/uL)  BASIC METABOLIC PANEL     Status: Abnormal   Collection Time   12/01/11  6:17 PM      Component Value Range   Sodium 137  135 - 145 (mEq/L)   Potassium 3.3 (*) 3.5 - 5.1 (mEq/L)   Chloride 99  96 - 112 (mEq/L)   CO2 26  19 - 32 (mEq/L)   Glucose, Bld 95  70 - 99 (mg/dL)   BUN 12  6 - 23 (mg/dL)   Creatinine, Ser 7.82  0.50 - 1.35 (mg/dL)   Calcium 9.5  8.4 - 95.6 (mg/dL)   GFR calc non Af Amer 75 (*) >90 (mL/min)   GFR calc Af Amer 87 (*) >90 (mL/min)  URINALYSIS, ROUTINE W REFLEX MICROSCOPIC     Status: Abnormal   Collection Time   12/01/11  9:59 PM      Component Value Range   Color, Urine YELLOW  YELLOW    APPearance CLEAR  CLEAR    Specific Gravity, Urine 1.022  1.005 - 1.030    pH 6.0  5.0 - 8.0    Glucose, UA NEGATIVE  NEGATIVE (mg/dL)   Hgb urine dipstick LARGE (*) NEGATIVE    Bilirubin Urine NEGATIVE  NEGATIVE    Ketones, ur NEGATIVE  NEGATIVE (mg/dL)   Protein, ur NEGATIVE  NEGATIVE (mg/dL)   Urobilinogen, UA 1.0  0.0 - 1.0 (mg/dL)   Nitrite NEGATIVE  NEGATIVE    Leukocytes, UA NEGATIVE  NEGATIVE   URINE MICROSCOPIC-ADD ON     Status: Abnormal    Collection Time   12/01/11  9:59 PM      Component Value Range   Squamous Epithelial / LPF RARE  RARE    WBC, UA 0-2  <3 (WBC/hpf)   RBC / HPF 21-50  <3 (RBC/hpf)   Bacteria, UA MANY (*) RARE    Urine-Other MUCOUS PRESENT    SEDIMENTATION RATE     Status: Abnormal   Collection Time   12/02/11  5:56 AM      Component Value Range   Sed Rate 61 (*) 0 - 16 (mm/hr)  C-REACTIVE PROTEIN     Status: Abnormal   Collection Time   12/02/11  5:56 AM      Component Value Range   CRP 13.33 (*) <0.60 (mg/dL)  BASIC METABOLIC PANEL     Status: Abnormal   Collection Time   12/02/11  5:56 AM      Component Value Range   Sodium 138  135 - 145 (mEq/L)   Potassium 3.8  3.5 - 5.1 (mEq/L)   Chloride 102  96 - 112 (mEq/L)   CO2 26  19 - 32 (mEq/L)   Glucose, Bld 93  70 - 99 (mg/dL)   BUN 14  6 - 23 (mg/dL)   Creatinine, Ser 2.13 (*) 0.50 - 1.35 (mg/dL)   Calcium 9.2  8.4 - 08.6 (mg/dL)   GFR calc non Af Amer 53 (*) >90 (mL/min)   GFR calc Af Amer 62 (*) >90 (mL/min)  CBC     Status: Normal   Collection Time   12/02/11  5:56 AM  Component Value Range   WBC 8.7  4.0 - 10.5 (K/uL)   RBC 4.30  4.22 - 5.81 (MIL/uL)   Hemoglobin 14.3  13.0 - 17.0 (g/dL)   HCT 16.1  09.6 - 04.5 (%)   MCV 95.3  78.0 - 100.0 (fL)   MCH 33.3  26.0 - 34.0 (pg)   MCHC 34.9  30.0 - 36.0 (g/dL)   RDW 40.9  81.1 - 91.4 (%)   Platelets 246  150 - 400 (K/uL)  PRO B NATRIURETIC PEPTIDE     Status: Abnormal   Collection Time   12/02/11  5:56 AM      Component Value Range   Pro B Natriuretic peptide (BNP) 310.5 (*) 0 - 125 (pg/mL)  PROCALCITONIN     Status: Normal   Collection Time   12/02/11  6:00 AM      Component Value Range   Procalcitonin 0.25    MRSA PCR SCREENING     Status: Normal   Collection Time   12/02/11 11:26 AM      Component Value Range   MRSA by PCR NEGATIVE  NEGATIVE    Recent Results (from the past 240 hour(s))  MRSA PCR SCREENING     Status: Normal   Collection Time   12/02/11 11:26 AM       Component Value Range Status Comment   MRSA by PCR NEGATIVE  NEGATIVE  Final     Renal Function:  Basename 12/02/11 0556 12/01/11 1817 11/27/11 1200  CREATININE 1.51* 1.14 1.27   Estimated Creatinine Clearance: 62.4 ml/min (by C-G formula based on Cr of 1.51).  Radiologic Imaging: Ct Abdomen Pelvis Wo Contrast  12/01/2011  *RADIOLOGY REPORT*  Clinical Data: The right kidney stone, right flank pain  CT ABDOMEN AND PELVIS WITHOUT CONTRAST  Technique:  Multidetector CT imaging of the abdomen and pelvis was performed following the standard protocol without intravenous contrast.  Comparison: None.  Findings: There is diffuse ground-glass opacities at the lung bases and interlobular septal thickening.  No pericardial fluid.  Non-IV contrast images demonstrate no focal hepatic lesion.  The gallbladder, pancreas, spleen, adrenal glands normal.  There are four nonobstructing calculi within the right kidney. There is mild pelvicaliectasis on the right and hydroureter on the right.  This secondary to a partially obstructing calculus at the right ureteral pelvic junction measuring 5 mm (image 44).  There are 6 calculi within the left kidney ranging size from 2-4 mm.  No evidence of left ureteral lithiasis.  The stomach, small bowel, and colon are normal.  Abdominal aorta normal caliber.  No retroperitoneal periportal lymphadenopathy.  No free fluid the pelvis.  No distal ureteral stones or bladder stones.  The prostate gland is normal.  No pelvic lymphadenopathy. Review of  bone windows demonstrates no aggressive osseous lesions.  IMPRESSION:  1.  Partially obstructing calculus at the right ureteropelvic junction with mild pelvicaliectasis and hydroureter. 2.  Bilateral nephrolithiasis. 3.  Diffuse ground-glass opacity lung bases representing pulmonary edema or diffuse infection or hemorrhage.  Original Report Authenticated By: Genevive Bi, M.D.   Dg Chest 2 View  12/01/2011  *RADIOLOGY REPORT*  Clinical  Data: Shortness of breath, hypoxia  CHEST - 2 VIEW  Comparison: 11/30/2011  Findings: Cardiomegaly again noted.  There is worsening interstitial prominence bilateral perihilar and especially lower lobes.  Findings suspicious for developing edema or pneumonitis. No segmental infiltrate is noted.  IMPRESSION: Worsening interstitial prominence bilateral perihilar and bilateral lower lobe suspicious for developing edema or pneumonitis.  No focal infiltrate.  Original Report Authenticated By: Natasha Mead, M.D.   Dg Chest Port 1 View  12/02/2011  *RADIOLOGY REPORT*  Clinical Data: Pneumonia  PORTABLE CHEST - 1 VIEW  Comparison: Chest radiograph 12/01/2011  Findings: Stable cardiac silhouette.  There is diffuse air space disease as well as linear interstitial thickening of the interstitial thickening appears slightly increased.  No pneumothorax.  IMPRESSION: Increased interstitial thickening and air space disease.  Differential includes atypical pneumonia versus inflammatory process.  Original Report Authenticated By: Genevive Bi, M.D.    I independently reviewed the above imaging studies.  Impression/Assessment:  Right ureteral stone with infection and impending sepsis  Plan:  - Urine culture - Start empiric antibiotic therapy with ceftriaxone - To OR for urgent cystoscopy and right ureteral stent placement. I discussed the potential benefits and risks of the procedure, side effects of the proposed treatment, the likelihood of the patient achieving the goals of the procedure, and any potential problems that might occur during the procedure or recuperation. He is agreeable to proceed.  He will then need to be treated with 10-14 days of antibiotic therapy prior to consideration of definitive treatment for his obstructing stone.   Nameer Summer,LES 12/02/2011, 4:59 PM    Moody Bruins MD

## 2011-12-03 ENCOUNTER — Inpatient Hospital Stay (HOSPITAL_COMMUNITY): Payer: 59

## 2011-12-03 DIAGNOSIS — M069 Rheumatoid arthritis, unspecified: Secondary | ICD-10-CM

## 2011-12-03 DIAGNOSIS — J9600 Acute respiratory failure, unspecified whether with hypoxia or hypercapnia: Secondary | ICD-10-CM

## 2011-12-03 DIAGNOSIS — J189 Pneumonia, unspecified organism: Secondary | ICD-10-CM

## 2011-12-03 LAB — BASIC METABOLIC PANEL
CO2: 25 mEq/L (ref 19–32)
Chloride: 101 mEq/L (ref 96–112)
Creatinine, Ser: 1.08 mg/dL (ref 0.50–1.35)
Glucose, Bld: 145 mg/dL — ABNORMAL HIGH (ref 70–99)

## 2011-12-03 LAB — CBC
Hemoglobin: 13.5 g/dL (ref 13.0–17.0)
MCV: 94.6 fL (ref 78.0–100.0)
Platelets: 339 10*3/uL (ref 150–400)
RBC: 4.04 MIL/uL — ABNORMAL LOW (ref 4.22–5.81)
WBC: 12.8 10*3/uL — ABNORMAL HIGH (ref 4.0–10.5)

## 2011-12-03 MED ORDER — PREDNISONE 50 MG PO TABS
60.0000 mg | ORAL_TABLET | Freq: Every day | ORAL | Status: DC
  Administered 2011-12-03 – 2011-12-04 (×2): 60 mg via ORAL
  Filled 2011-12-03 (×3): qty 1

## 2011-12-03 MED ORDER — METHYLPREDNISOLONE SODIUM SUCC 40 MG IJ SOLR: 40.0000 mg | Freq: Two times a day (BID) | INTRAMUSCULAR | Status: DC

## 2011-12-03 NOTE — Consult Note (Signed)
Name: Stuart Collins MRN: [id number] DOB: 1966/09/14    LOS: 2   PCCM  PROGRESS NOTE  Brief patient profile:  45 yowm never regular smoker longstanding allergic rhinitis/asthma followed by [facility] on advair and RA on MTX followed by [facility] since early Feb 2013 with worsening cough and sinus symptoms and doe rx as sinusitis/bronchitis with multiple abx including avelox on admit 3/16 with R flank pain dx with nepholithiasis/ Ild/ acute resp failure with temp spike to 103 3/17 and worsening resp distress so moved to ICU and pccm consulted at that point by [facility].     Micro/sepsis markers:  MRSA 3/17 > neg BCx 2 3/16 >>> Procalcitonin 3/17 >>>.25  Antibiotics: Zosyn (hcap) 3/17 >>>3/18 Vanc (hcap) 3/17 >>>3/18 3/17 roc>> Tests / Events: Ct Abdomen Pelvis Wo Contrast  12/01/2011  *RADIOLOGY REPORT*  Clinical Data: The right kidney stone, right flank pain  CT ABDOMEN AND PELVIS WITHOUT CONTRAST  Technique:  Multidetector CT imaging of the abdomen and pelvis was performed following the standard protocol without intravenous contrast.  Comparison: None.  Findings: There is diffuse ground-glass opacities at the lung bases and interlobular septal thickening.  No pericardial fluid.  Non-IV contrast images demonstrate no focal hepatic lesion.  The gallbladder, pancreas, spleen, adrenal glands normal.  There are four nonobstructing calculi within the right kidney. There is mild pelvicaliectasis on the right and hydroureter on the right.  This secondary to a partially obstructing calculus at the right ureteral pelvic junction measuring 5 mm (image 44).  There are 6 calculi within the left kidney ranging size from 2-4 mm.  No evidence of left ureteral lithiasis.  The stomach, small bowel, and colon are normal.  Abdominal aorta normal caliber.  No retroperitoneal periportal lymphadenopathy.  No free fluid the pelvis.  No distal ureteral stones or bladder stones.  The prostate gland is normal.  No pelvic  lymphadenopathy. Review of  bone windows demonstrates no aggressive osseous lesions.  IMPRESSION:  1.  Partially obstructing calculus at the right ureteropelvic junction with mild pelvicaliectasis and hydroureter. 2.  Bilateral nephrolithiasis. 3.  Diffuse ground-glass opacity lung bases representing pulmonary edema or diffuse infection or hemorrhage.  Original Report Authenticated By: Genevive Bi, M.D.   Dg Chest 2 View  12/01/2011  *RADIOLOGY REPORT*  Clinical Data: Shortness of breath, hypoxia  CHEST - 2 VIEW  Comparison: 11/30/2011  Findings: Cardiomegaly again noted.  There is worsening interstitial prominence bilateral perihilar and especially lower lobes.  Findings suspicious for developing edema or pneumonitis. No segmental infiltrate is noted.  IMPRESSION: Worsening interstitial prominence bilateral perihilar and bilateral lower lobe suspicious for developing edema or pneumonitis.  No focal infiltrate.  Original Report Authenticated By: Natasha Mead, M.D.   Dg Abd 1 View  12/02/2011  *RADIOLOGY REPORT*  Clinical Data: Right ureteral stone.  Bilateral kidney stones.  ABDOMEN - 1 VIEW  Comparison: CT scan dated 12/01/2011  Findings: There is a 6 mm stone in the right ureteral pelvic junction, unchanged in position since the prior CT scan.  Multiple bilateral renal stones are noted, unchanged.  Bowel gas pattern is normal.  IMPRESSION: No change in the proximal right ureteral stone.  Original Report Authenticated By: Gwynn Burly, M.D.   Dg Chest Port 1 View  12/02/2011  *RADIOLOGY REPORT*  Clinical Data: Pneumonia  PORTABLE CHEST - 1 VIEW  Comparison: Chest radiograph 12/01/2011  Findings: Stable cardiac silhouette.  There is diffuse air space disease as well as linear interstitial thickening of  the interstitial thickening appears slightly increased.  No pneumothorax.  IMPRESSION: Increased interstitial thickening and air space disease.  Differential includes atypical pneumonia versus  inflammatory process.  Original Report Authenticated By: Genevive Bi, M.D.   Echo 3/17 > Study does not show structural cardiac abnormalities and echo findings suggest normal left and right heart filling pressure.  3/17 Cystoscopy, Right retrograde pyelogram and Right JJ stent ( 71F x 24 cm)   Subjective: Sitting up in chair NAD at rest . 3 l [place]. Vital Signs: Temp:  [97.1 F (36.2 C)-103.1 F (39.5 C)] 98.6 F (37 C) (03/18 0800) Pulse Rate:  [78-113] 90  (03/18 0600) Resp:  [17-32] 20  (03/18 0600) BP: (109-146)/(60-80) 109/64 mmHg (03/18 0330) SpO2:  [92 %-100 %] 95 % (03/18 0600) FiO2 (%):  [3 %-50 %] 3 % (03/18 0600) Weight:  [168 lb 14 oz (76.6 kg)] 168 lb 14 oz (76.6 kg) (03/18 0000)  Intake/Output Summary (Last 24 hours) at 12/03/11 0959 Last data filed at 12/03/11 0700  Gross per 24 hour  Intake   2254 ml  Output   3225 ml  Net   -971 ml     Physical Examination: General: NAD Neuro:  Alert and approp no def HEENT:  PERRL, pink conjunctivae, moist membranes Neck:  Supple, no JVD   Cardiovascular:  RRR, no M/R/G Lungs:  Bilateral decreased in bases Abdomen:  Soft, nontender, nondistended, bowel sounds present Musculoskeletal:  Moves all extremities, no pedal edema Skin:  No rash MS redness symmetric all the ext surfaces of  Joints all fingers    Labs    Lab 12/03/11 0655 12/02/11 0556 12/01/11 1817  NA 137 138 137  K 3.7 3.8 3.3*  CL 101 102 99  CO2 25 26 26   BUN 19 14 12   CREATININE 1.08 1.51* 1.14  GLUCOSE 145* 93 95    Lab 12/03/11 0655 12/02/11 0556 12/01/11 1817  HGB 13.5 14.3 15.4  HCT 38.2* 41.0 42.7  WBC 12.8* 8.7 7.4  PLT 339 246 240             ASSESSMENT AND PLAN    PULMONARY  A:  Pneumonitis picture on CXR - now improving. 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 Transition to oral steroids Wean O2 Will need Pulmonary f/u after discharge in a week or two to ensure complete resolution of CXR Would not restart MTX until  Xray has cleared completely    RENAL  A:  Mild acute renal failure with R nephrolithiasis Improving D/c further diuretics for now     INFECTIOUS  A uti sepsis On roc #1   Brett Canales Minor ACNP [facility] PCCM Pager [phone] till 3 pm If no answer page [phone] 12/03/2011, 10:00 AM    He can go home when his O2 sats are adequate on RA and when OK'd by Urology He will need F/U with Pulmonary and Urology after discharge  Pt seen and examined and database reviewed. I agree with above findings, assessment and plan  Billy Fischer, MD;  PCCM service; Mobile [phone]

## 2011-12-03 NOTE — Plan of Care (Signed)
Problem: Phase II Progression Outcomes Goal: Other Phase II Outcomes/Goals Outcome: Completed/Met Date Met:  12/03/11 12/02/11; Placement of Right Renal stent to relieve hydronephrosois

## 2011-12-03 NOTE — Progress Notes (Signed)
Patient ID: Stuart Collins, male   DOB: 1966-05-02, 46 y.o.   MRN: [id number]  1 Day Post-Op Subjective: S/P right ureteral stent last night.  No fever overnight.  He has minimal right flank pain.  He does complain of mild right lower quadrant pain.  His oxygen requirement is now decreased.  Objective: Vital signs in last 24 hours: Temp:  [97.1 F (36.2 C)-103.1 F (39.5 C)] 98 F (36.7 C) (03/18 0400) Pulse Rate:  [78-113] 90  (03/18 0600) Resp:  [17-32] 20  (03/18 0600) BP: (109-146)/(60-80) 109/64 mmHg (03/18 0330) SpO2:  [92 %-100 %] 95 % (03/18 0600) FiO2 (%):  [3 %-50 %] 3 % (03/18 0600) Weight:  [76.6 kg (168 lb 14 oz)] 76.6 kg (168 lb 14 oz) (03/18 0000)  Intake/Output from previous day: 03/17 0701 - 03/18 0700 In: 2154 [P.O.:360; I.V.:1740; IV Piggyback:54] Out: 3225 [Urine:3225] Intake/Output this shift:    Physical Exam:  General: Alert and oriented CV: RRR Abdomen: Soft, ND Ext: NT, No erythema  Lab Results:  Basename 12/03/11 0655 12/02/11 0556 12/01/11 1817  HGB 13.5 14.3 15.4  HCT 38.2* 41.0 42.7   Lab Results  Component Value Date   WBC 12.8* 12/03/2011   HGB 13.5 12/03/2011   HCT 38.2* 12/03/2011   MCV 94.6 12/03/2011   PLT 339 12/03/2011    BMET  Basename 12/02/11 0556 12/01/11 1817  NA 138 137  K 3.8 3.3*  CL 102 99  CO2 26 26  GLUCOSE 93 95  BUN 14 12  CREATININE 1.51* 1.14  CALCIUM 9.2 9.5     Studies/Results: Ct Abdomen Pelvis Wo Contrast  12/01/2011  *RADIOLOGY REPORT*  Clinical Data: The right kidney stone, right flank pain  CT ABDOMEN AND PELVIS WITHOUT CONTRAST  Technique:  Multidetector CT imaging of the abdomen and pelvis was performed following the standard protocol without intravenous contrast.  Comparison: None.  Findings: There is diffuse ground-glass opacities at the lung bases and interlobular septal thickening.  No pericardial fluid.  Non-IV contrast images demonstrate no focal hepatic lesion.  The gallbladder, pancreas, spleen,  adrenal glands normal.  There are four nonobstructing calculi within the right kidney. There is mild pelvicaliectasis on the right and hydroureter on the right.  This secondary to a partially obstructing calculus at the right ureteral pelvic junction measuring 5 mm (image 44).  There are 6 calculi within the left kidney ranging size from 2-4 mm.  No evidence of left ureteral lithiasis.  The stomach, small bowel, and colon are normal.  Abdominal aorta normal caliber.  No retroperitoneal periportal lymphadenopathy.  No free fluid the pelvis.  No distal ureteral stones or bladder stones.  The prostate gland is normal.  No pelvic lymphadenopathy. Review of  bone windows demonstrates no aggressive osseous lesions.  IMPRESSION:  1.  Partially obstructing calculus at the right ureteropelvic junction with mild pelvicaliectasis and hydroureter. 2.  Bilateral nephrolithiasis. 3.  Diffuse ground-glass opacity lung bases representing pulmonary edema or diffuse infection or hemorrhage.  Original Report Authenticated By: Genevive Bi, M.D.   Dg Chest 2 View  12/01/2011  *RADIOLOGY REPORT*  Clinical Data: Shortness of breath, hypoxia  CHEST - 2 VIEW  Comparison: 11/30/2011  Findings: Cardiomegaly again noted.  There is worsening interstitial prominence bilateral perihilar and especially lower lobes.  Findings suspicious for developing edema or pneumonitis. No segmental infiltrate is noted.  IMPRESSION: Worsening interstitial prominence bilateral perihilar and bilateral lower lobe suspicious for developing edema or pneumonitis.  No focal infiltrate.  Original Report Authenticated By: Natasha Mead, M.D.   Dg Abd 1 View  12/02/2011  *RADIOLOGY REPORT*  Clinical Data: Right ureteral stone.  Bilateral kidney stones.  ABDOMEN - 1 VIEW  Comparison: CT scan dated 12/01/2011  Findings: There is a 6 mm stone in the right ureteral pelvic junction, unchanged in position since the prior CT scan.  Multiple bilateral renal stones are  noted, unchanged.  Bowel gas pattern is normal.  IMPRESSION: No change in the proximal right ureteral stone.  Original Report Authenticated By: Gwynn Burly, M.D.   Dg Chest Port 1 View  12/02/2011  *RADIOLOGY REPORT*  Clinical Data: Pneumonia  PORTABLE CHEST - 1 VIEW  Comparison: Chest radiograph 12/01/2011  Findings: Stable cardiac silhouette.  There is diffuse air space disease as well as linear interstitial thickening of the interstitial thickening appears slightly increased.  No pneumothorax.  IMPRESSION: Increased interstitial thickening and air space disease.  Differential includes atypical pneumonia versus inflammatory process.  Original Report Authenticated By: Genevive Bi, M.D.   Blood and urine culture results pending.  Assessment/Plan: 1. Right ureteral stone and fever: S/P stent.  Continue ceftriaxone and await culture results. He will ultimately require about 2 weeks of antibiotic therapy followed by definitive stone treatment.   LOS: 2 days   Makinsey Pepitone,LES 12/03/2011, 7:25 AM

## 2011-12-03 NOTE — Progress Notes (Signed)
CARE MANAGEMENT NOTE 12/03/2011  Patient:  VANESSA, ALESI   Account Number:  [id number]  Date Initiated:  12/03/2011  Documentation initiated by:  Darcie Mellone  Subjective/Objective Assessment:   pt  admitted with fevers and sickness depsite abx since mid feb.  now presented with urinary stones and obstructions poss uro and pul sepsis     Action/Plan:   lives at home   Anticipated DC Date:  12/06/2011   Anticipated DC Plan:  HOME/SELF CARE  In-house referral  NA      DC Planning Services  NA      [facility] Choice  NA   Choice offered to / List presented to:  NA   DME arranged  NA      DME agency  NA     HH arranged  NA      HH agency  NA   Status of service:  In process, will continue to follow Medicare Important Message given?  NA - LOS <3 / Initial given by admissions (If response is "NO", the following Medicare IM given date fields will be blank) Date Medicare IM given:   Date Additional Medicare IM given:    Discharge Disposition:    Per UR Regulation:  Reviewed for med. necessity/level of care/duration of stay  If discussed at Long Length of Stay Meetings, dates discussed:    Comments:  [id number]/Sophonie Goforth,RN,BSN,CCM

## 2011-12-04 LAB — BASIC METABOLIC PANEL
CO2: 27 mEq/L (ref 19–32)
GFR calc non Af Amer: 73 mL/min — ABNORMAL LOW (ref >90)
Glucose, Bld: 159 mg/dL — ABNORMAL HIGH (ref 70–99)
Potassium: 4.3 mEq/L (ref 3.5–5.1)
Sodium: 141 mEq/L (ref 135–145)

## 2011-12-04 LAB — CBC
Hemoglobin: 13.1 g/dL (ref 13.0–17.0)
MCHC: 34.6 g/dL (ref 30.0–36.0)
RBC: 3.96 MIL/uL — ABNORMAL LOW (ref 4.22–5.81)
WBC: 18.5 10*3/uL — ABNORMAL HIGH (ref 4.0–10.5)

## 2011-12-04 LAB — URINE CULTURE

## 2011-12-04 MED ORDER — PREDNISONE 20 MG PO TABS: ORAL_TABLET | ORAL | Status: DC

## 2011-12-04 MED ORDER — HYDROCODONE-ACETAMINOPHEN 5-300 MG PO TABS: 1.0000 | ORAL_TABLET | Freq: Four times a day (QID) | ORAL | Status: DC

## 2011-12-04 MED ORDER — AMOXICILLIN-POT CLAVULANATE 875-125 MG PO TABS: 1.0000 | ORAL_TABLET | Freq: Two times a day (BID) | ORAL | Status: AC

## 2011-12-04 MED ORDER — CIPROFLOXACIN HCL 500 MG PO TABS: 500.0000 mg | ORAL_TABLET | Freq: Two times a day (BID) | ORAL | Status: AC

## 2011-12-04 NOTE — Progress Notes (Signed)
Pt. Received discharge information. Reviewed medications and follow-up appointments. Pt. Verbalized understanding. Will be taken to personal vehicle by wheelchair.

## 2011-12-04 NOTE — Progress Notes (Signed)
Patient ID: Stuart Collins, male   DOB: 18-Nov-1965, 46 y.o.   MRN: [id number]  2 Days Post-Op Subjective: Pt doing well and tolerating stent.  No severe flank pain symptoms.  No fever for > 24 hrs.  Objective: Vital signs in last 24 hours: Temp:  [97.5 F (36.4 C)-99.8 F (37.7 C)] 97.5 F (36.4 C) (03/19 0442) Pulse Rate:  [73-103] 73  (03/19 0442) Resp:  [18-20] 18  (03/19 0442) BP: (112-126)/(67-73) 112/67 mmHg (03/19 0442) SpO2:  [95 %-99 %] 99 % (03/19 0442) FiO2 (%):  [3 %] 3 % (03/18 1519)  Intake/Output from previous day: 03/18 0701 - 03/19 0700 In: 600 [I.V.:600] Out: 1025 [Urine:1025] Intake/Output this shift:    Physical Exam:  General: Alert and oriented Abdomen: Soft, ND   Lab Results:  Basename 12/04/11 0416 12/03/11 0655 12/02/11 0556  HGB 13.1 13.5 14.3  HCT 37.9* 38.2* 41.0   Lab Results  Component Value Date   WBC 18.5* 12/04/2011   HGB 13.1 12/04/2011   HCT 37.9* 12/04/2011   MCV 95.7 12/04/2011   PLT 422* 12/04/2011    BMET  Basename 12/04/11 0416 12/03/11 0655  NA 141 137  K 4.3 3.7  CL 105 101  CO2 27 25  GLUCOSE 159* 145*  BUN 28* 19  CREATININE 1.17 1.08  CALCIUM 9.5 9.4     Studies/Results: Urine culture: No growth (culture was not checked at time of initial UA prior to receiving Vancomycin and Zosyn)  Assessment/Plan: -- OK for discharge home from urologic standpoint.  Will treat with 2 weeks of empiric Augmentin and Cipro and follow up as outpatient with a KUB at that time to discuss definitive stone management once infection cleared.  WBC increase likely related to recent steroid administration as he has been afebrile for over 2 days and last urine culture and blood cultures have been negative.   LOS: 3 days   Elad Macphail,LES 12/04/2011, 7:06 AM

## 2011-12-04 NOTE — Discharge Summary (Signed)
Physician Discharge Summary  Patient ID: Stuart Collins MRN: [id number] DOB/AGE: 1966/08/29 46 y.o.  Admit date: 12/01/2011 Discharge date: 12/04/2011  Problem List Principal Problem:  *Pneumonitis Active Problems:  Nephrolithiasis  Hydroureter  Arthritis  Acute respiratory failure  Headache  HPI: This is a pleasant 46 year old gentleman who states his been ill from last Sunday. Sunday symptoms started with a headache and sinuses pressure, which were initially relieved with pain medications. Symptoms returned Monday, Tuesday and Wednesday, this time unrelieved with over-the-counter pain medications. He went to [facility] on Tuesday, he was discharged but had no significant improvement. By Wednesday he was developing shortness of breath and a cough. he followed up his PCP, chest x-ray and sinus films were done. He was discharged with Avelox and for six-day course. He was taking this and hydrocodone. Yesterday he developed back pain right-sided, which today moved to the front. He has a history of nephrolithiasis, he realized the cause and came to the ER. He does report decreased urine output today. No obvious hematuria. He denies fever, chills, nausea, vomiting, burning urination, or diarrhea.  In the ER the patient is a bit hypoxic with ambulation. He denies hemoptysis. The patient does report he has allergies. He sees an allergist and gets allergy shots. He currently sees a new allergist who recently change his medication regimen. He normally gets January and February allergy flare, which he states he did get this season as well. History provided by the patient.  Hospital Course:  Micro/sepsis markers:  MRSA 3/17 > neg  BCx 2 3/16 >>>  Procalcitonin 3/17 >>>.25  Antibiotics:  Zosyn (hcap) 3/17 >>>3/18  Vanc (hcap) 3/17 >>>3/18  3/17 roc>>  Tests / Events:  Ct Abdomen Pelvis Wo Contrast  12/01/2011 *RADIOLOGY REPORT* Clinical Data: The right kidney stone, right flank pain CT ABDOMEN AND  PELVIS WITHOUT CONTRAST Technique: Multidetector CT imaging of the abdomen and pelvis was performed following the standard protocol without intravenous contrast. Comparison: None. Findings: There is diffuse ground-glass opacities at the lung bases and interlobular septal thickening. No pericardial fluid. Non-IV contrast images demonstrate no focal hepatic lesion. The gallbladder, pancreas, spleen, adrenal glands normal. There are four nonobstructing calculi within the right kidney. There is mild pelvicaliectasis on the right and hydroureter on the right. This secondary to a partially obstructing calculus at the right ureteral pelvic junction measuring 5 mm (image 44). There are 6 calculi within the left kidney ranging size from 2-4 mm. No evidence of left ureteral lithiasis. The stomach, small bowel, and colon are normal. Abdominal aorta normal caliber. No retroperitoneal periportal lymphadenopathy. No free fluid the pelvis. No distal ureteral stones or bladder stones. The prostate gland is normal. No pelvic lymphadenopathy. Review of bone windows demonstrates no aggressive osseous lesions. IMPRESSION: 1. Partially obstructing calculus at the right ureteropelvic junction with mild pelvicaliectasis and hydroureter. 2. Bilateral nephrolithiasis. 3. Diffuse ground-glass opacity lung bases representing pulmonary edema or diffuse infection or hemorrhage. Original Report Authenticated By: Genevive Bi, M.D.  Dg Chest 2 View  12/01/2011 *RADIOLOGY REPORT* Clinical Data: Shortness of breath, hypoxia CHEST - 2 VIEW Comparison: 11/30/2011 Findings: Cardiomegaly again noted. There is worsening interstitial prominence bilateral perihilar and especially lower lobes. Findings suspicious for developing edema or pneumonitis. No segmental infiltrate is noted. IMPRESSION: Worsening interstitial prominence bilateral perihilar and bilateral lower lobe suspicious for developing edema or pneumonitis. No focal infiltrate. Original  Report Authenticated By: Natasha Mead, M.D.  Dg Abd 1 View  12/02/2011 *RADIOLOGY REPORT* Clinical Data: Right  ureteral stone. Bilateral kidney stones. ABDOMEN - 1 VIEW Comparison: CT scan dated 12/01/2011 Findings: There is a 6 mm stone in the right ureteral pelvic junction, unchanged in position since the prior CT scan. Multiple bilateral renal stones are noted, unchanged. Bowel gas pattern is normal. IMPRESSION: No change in the proximal right ureteral stone. Original Report Authenticated By: Gwynn Burly, M.D.  Dg Chest Port 1 View  12/02/2011 *RADIOLOGY REPORT* Clinical Data: Pneumonia PORTABLE CHEST - 1 VIEW Comparison: Chest radiograph 12/01/2011 Findings: Stable cardiac silhouette. There is diffuse air space disease as well as linear interstitial thickening of the interstitial thickening appears slightly increased. No pneumothorax. IMPRESSION: Increased interstitial thickening and air space disease. Differential includes atypical pneumonia versus inflammatory process. Original Report Authenticated By: Genevive Bi, M.D.   Echo 3/17 > Study does not show structural cardiac abnormalities and echo findings suggest normal left and right heart filling pressure.  3/17  Cystoscopy, Right retrograde pyelogram and Right JJ stent ( 41F x 24 cm)  Subjective:  Sitting up in chair NAD at rest . 3 l [place].  Vital Signs:  Temp: [97.1 F (36.2 C)-103.1 F (39.5 C)] 98.6 F (37 C) (03/18 0800)  Pulse Rate: [78-113] 90 (03/18 0600)  Resp: [17-32] 20 (03/18 0600)  BP: (109-146)/(60-80) 109/64 mmHg (03/18 0330)  SpO2: [92 %-100 %] 95 % (03/18 0600)  FiO2 (%): [3 %-50 %] 3 % (03/18 0600)  Weight: [168 lb 14 oz (76.6 kg)] 168 lb 14 oz (76.6 kg) (03/18 0000)   Intake/Output Summary (Last 24 hours) at 12/03/11 0959 Last data filed at 12/03/11 0700   Gross per 24 hour   Intake  2254 ml   Output  3225 ml   Net  -971 ml    Physical Examination:  General: NAD  Neuro: Alert and approp no def  HEENT:  PERRL, pink conjunctivae, moist membranes  Neck: Supple, no JVD  Cardiovascular: RRR, no M/R/G  Lungs: Bilateral decreased in bases  Abdomen: Soft, nontender, nondistended, bowel sounds present  Musculoskeletal: Moves all extremities, no pedal edema  Skin: No rash  MS redness symmetric all the ext surfaces of Joints all fingers  3/19 ambulated 500 feet on ra with sats 97%. Labs   Lab  12/03/11 0655  12/02/11 0556  12/01/11 1817   NA  137  138  137   K  3.7  3.8  3.3*   CL  101  102  99   CO2  25  26  26    BUN  19  14  12    CREATININE  1.08  1.51*  1.14   GLUCOSE  145*  93  95     Lab  12/03/11 0655  12/02/11 0556  12/01/11 1817   HGB  13.5  14.3  15.4   HCT  38.2*  41.0  42.7   WBC  12.8*  8.7  7.4   PLT  339  246  240    ASSESSMENT AND PLAN  PULMONARY  A: Pneumonitis picture on CXR - now improving. 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  RENAL  A: Mild acute renal failure with R nephrolithiasis  Improving  D/c further diuretics for now  INFECTIOUS  A uti sepsis  On roc #1 dc  3/19 per Dr Laverle Patter and placed on Cipro and Augmentin.    Labs at discharge Lab Results  Component Value Date   CREATININE 1.17 12/04/2011   BUN 28* 12/04/2011   NA 141 12/04/2011   K 4.3 12/04/2011   CL 105 12/04/2011   CO2 27 12/04/2011   Lab Results  Component Value Date   WBC 18.5* 12/04/2011   HGB 13.1 12/04/2011   HCT 37.9* 12/04/2011   MCV 95.7 12/04/2011   PLT 422* 12/04/2011   No results found for this basename: ALT, AST, GGT, ALKPHOS, BILITOT   No results found for this basename: INR, PROTIME    Current radiology studies Dg Chest 2 View  12/03/2011  *RADIOLOGY REPORT*  Clinical Data: Shortness of breath, cough  CHEST - 2 VIEW  Comparison: 12/02/2011   Findings: Improving bilateral interstitial opacities, most focal in the right perihilar/middle lobe region, possibly reflecting interstitial edema or atypical infection.  No pleural effusion or pneumothorax.  The heart is normal in size  Visualized osseous structures are within normal limits.  IMPRESSION: Improving bilateral interstitial opacities, possibly reflecting interstitial edema or atypical infection.  Original Report Authenticated By: Charline Bills, M.D.   Dg Abd 1 View  12/02/2011  *RADIOLOGY REPORT*  Clinical Data: Right ureteral stone.  Bilateral kidney stones.  ABDOMEN - 1 VIEW  Comparison: CT scan dated 12/01/2011  Findings: There is a 6 mm stone in the right ureteral pelvic junction, unchanged in position since the prior CT scan.  Multiple bilateral renal stones are noted, unchanged.  Bowel gas pattern is normal.  IMPRESSION: No change in the proximal right ureteral stone.  Original Report Authenticated By: Gwynn Burly, M.D.    Disposition:  01-Home or Self Care  Discharge Orders    Future Appointments: Provider: Department: Dept Phone: Center:   12/18/2011 10:15 AM Julio Sicks, NP [facility] [phone] None     Future Orders Please Complete By Expires   Diet - low sodium heart healthy      Increase activity slowly        Medication List  As of 12/04/2011 11:54 AM   STOP taking these medications         ADVIL COLD & SINUS LIQUI-GELS PO      fexofenadine 180 MG tablet      ibuprofen 200 MG tablet      methotrexate 2.5 MG tablet      moxifloxacin 400 MG tablet         TAKE these medications         amoxicillin-clavulanate 875-125 MG per tablet   Commonly known as: AUGMENTIN   Take 1 tablet by mouth 2 (two) times daily.      aspirin EC 81 MG tablet   Take 81 mg by mouth daily.      azelastine 137 MCG/SPRAY nasal spray   Commonly known as: ASTELIN   Place 1 spray into the nose 2 (two) times daily. Use in each nostril as directed       ciprofloxacin 500 MG tablet   Commonly known as: CIPRO   Take 1 tablet (500 mg total) by mouth 2 (two) times daily.      ezetimibe 10 MG tablet   Commonly known as: ZETIA   Take 10 mg by mouth daily.      fenofibrate 160 MG tablet   Take 160 mg by mouth daily.      fluticasone 50 MCG/ACT nasal spray   Commonly known as: FLONASE  Place 2 sprays into the nose daily.      fluticasone-salmeterol 115-21 MCG/ACT inhaler   Commonly known as: ADVAIR HFA   Inhale 2 puffs into the lungs 2 (two) times daily.      folic acid 1 MG tablet   Commonly known as: FOLVITE   Take 1 mg by mouth daily.      HYDROcodone-acetaminophen 5-325 MG per tablet   Commonly known as: NORCO   1 tablet every 6 (six) hours as needed. Take 1-2 tablets every 6 hours as needed for severe pain      Hydrocodone-Acetaminophen 5-300 MG Tabs   Take 1-2 tablets by mouth every 6 (six) hours.      mulitivitamin with minerals Tabs   Take 1 tablet by mouth daily.      ORENCIA 125 MG/ML Soln   Generic drug: Abatacept   Inject 125 mg into the skin once a week. On sundays      pantoprazole 40 MG tablet   Commonly known as: PROTONIX   Take 40 mg by mouth daily.      predniSONE 20 MG tablet   Commonly known as: DELTASONE   3 tabs x 3 days, then  2 tabs x 3 days, then  1tabs x 3 days, then stop      ramelteon 8 MG tablet   Commonly known as: ROZEREM   Take 8 mg by mouth daily as needed. For sleep      VITAMIN C PO   Take 1 tablet by mouth daily.           Follow-up Information    Follow up with BORDEN,LES, MD. (Will call to schedule for 2-3 weeks)    Contact information:   [address] [facility] [place] [address] [phone]       Follow up with PARRETT,TAMMY, NP on 12/18/2011. (10:am with chest xray)    Contact information:   [facility] [address] [place]  [address] [phone]         45  minutes  spent in discharge preparation. Discharged Condition: good  Signed: Brett Canales Minor ACNP Adolph Pollack PCCM Pager [phone] till 3 pm If no answer page [phone] 12/04/2011, 11:54 AM   Pt seen and examined and database reviewed. I agree with above findings, assessment and plan  Billy Fischer, MD;  PCCM service; Mobile [phone]

## 2011-12-08 LAB — CULTURE, BLOOD (ROUTINE X 2)
Culture  Setup Time: 201303170205
Culture  Setup Time: 201303170205
Culture: NO GROWTH

## 2011-12-12 ENCOUNTER — Encounter (HOSPITAL_COMMUNITY): Payer: Self-pay | Admitting: Urology

## 2011-12-14 ENCOUNTER — Encounter (HOSPITAL_COMMUNITY): Payer: Self-pay

## 2011-12-14 ENCOUNTER — Emergency Department (HOSPITAL_COMMUNITY): Payer: 59

## 2011-12-14 ENCOUNTER — Inpatient Hospital Stay (HOSPITAL_COMMUNITY)
Admission: EM | Admit: 2011-12-14 | Discharge: 2011-12-16 | DRG: 668 | Disposition: A | Discharge status: Home / Self Care | Payer: 59 | Attending: Urology | Admitting: Urology

## 2011-12-14 DIAGNOSIS — N23 Unspecified renal colic: Secondary | ICD-10-CM

## 2011-12-14 DIAGNOSIS — N201 Calculus of ureter: Principal | ICD-10-CM | POA: Diagnosis present

## 2011-12-14 DIAGNOSIS — R42 Dizziness and giddiness: Secondary | ICD-10-CM | POA: Diagnosis present

## 2011-12-14 DIAGNOSIS — D72829 Elevated white blood cell count, unspecified: Secondary | ICD-10-CM | POA: Diagnosis present

## 2011-12-14 DIAGNOSIS — J45909 Unspecified asthma, uncomplicated: Secondary | ICD-10-CM | POA: Diagnosis present

## 2011-12-14 DIAGNOSIS — Z79899 Other long term (current) drug therapy: Secondary | ICD-10-CM

## 2011-12-14 DIAGNOSIS — R110 Nausea: Secondary | ICD-10-CM | POA: Diagnosis present

## 2011-12-14 DIAGNOSIS — J189 Pneumonia, unspecified organism: Secondary | ICD-10-CM | POA: Diagnosis present

## 2011-12-14 DIAGNOSIS — Z7982 Long term (current) use of aspirin: Secondary | ICD-10-CM

## 2011-12-14 DIAGNOSIS — Z87442 Personal history of urinary calculi: Secondary | ICD-10-CM

## 2011-12-14 DIAGNOSIS — M1380 Other specified arthritis, unspecified site: Secondary | ICD-10-CM | POA: Diagnosis present

## 2011-12-14 DIAGNOSIS — M129 Arthropathy, unspecified: Secondary | ICD-10-CM | POA: Diagnosis present

## 2011-12-14 DIAGNOSIS — R52 Pain, unspecified: Secondary | ICD-10-CM

## 2011-12-14 LAB — CBC
MCV: 98.9 fL (ref 78.0–100.0)
Platelets: 374 10*3/uL (ref 150–400)
RBC: 4.72 MIL/uL (ref 4.22–5.81)
WBC: 16.4 10*3/uL — ABNORMAL HIGH (ref 4.0–10.5)

## 2011-12-14 LAB — URINE MICROSCOPIC-ADD ON

## 2011-12-14 LAB — URINALYSIS, ROUTINE W REFLEX MICROSCOPIC
Specific Gravity, Urine: 1.021 (ref 1.005–1.030)
Urobilinogen, UA: 0.2 mg/dL (ref 0.0–1.0)

## 2011-12-14 LAB — BASIC METABOLIC PANEL
CO2: 29 mEq/L (ref 19–32)
Chloride: 99 mEq/L (ref 96–112)
Creatinine, Ser: 1.26 mg/dL (ref 0.50–1.35)
Sodium: 138 mEq/L (ref 135–145)

## 2011-12-14 MED ORDER — SODIUM CHLORIDE 0.9 % IV SOLN
INTRAVENOUS | Status: DC
  Administered 2011-12-14: 1000 mL via INTRAVENOUS
  Administered 2011-12-15 – 2011-12-16 (×2): via INTRAVENOUS

## 2011-12-14 MED ORDER — FLUTICASONE PROPIONATE 50 MCG/ACT NA SUSP
2.0000 | Freq: Every day | NASAL | Status: DC
  Administered 2011-12-14: 2 via NASAL
  Filled 2011-12-14: qty 16

## 2011-12-14 MED ORDER — FLUTICASONE-SALMETEROL 115-21 MCG/ACT IN AERO
2.0000 | INHALATION_SPRAY | Freq: Two times a day (BID) | RESPIRATORY_TRACT | Status: DC
  Filled 2011-12-14: qty 8

## 2011-12-14 MED ORDER — OXYBUTYNIN CHLORIDE 5 MG PO TABS
5.0000 mg | ORAL_TABLET | Freq: Three times a day (TID) | ORAL | Status: DC | PRN
  Filled 2011-12-14: qty 1

## 2011-12-14 MED ORDER — FENTANYL CITRATE 0.05 MG/ML IJ SOLN
25.0000 ug | INTRAMUSCULAR | Status: DC | PRN
  Administered 2011-12-14: 50 ug via INTRAVENOUS
  Filled 2011-12-14: qty 2

## 2011-12-14 MED ORDER — AZELASTINE HCL 0.1 % NA SOLN
1.0000 | Freq: Two times a day (BID) | NASAL | Status: DC
  Administered 2011-12-14 – 2011-12-15 (×2): 1 via NASAL
  Filled 2011-12-14: qty 30

## 2011-12-14 MED ORDER — ENOXAPARIN SODIUM 40 MG/0.4ML ~~LOC~~ SOLN
40.0000 mg | SUBCUTANEOUS | Status: DC
  Filled 2011-12-14 (×4): qty 0.4

## 2011-12-14 MED ORDER — OXYCODONE-ACETAMINOPHEN 5-325 MG PO TABS: 1.0000 | ORAL_TABLET | ORAL | Status: DC | PRN

## 2011-12-14 MED ORDER — BELLADONNA ALKALOIDS-OPIUM 16.2-60 MG RE SUPP
1.0000 | Freq: Once | RECTAL | Status: AC
  Administered 2011-12-14: 1 via RECTAL
  Filled 2011-12-14: qty 1

## 2011-12-14 MED ORDER — FLUTICASONE-SALMETEROL 250-50 MCG/DOSE IN AEPB
1.0000 | INHALATION_SPRAY | Freq: Two times a day (BID) | RESPIRATORY_TRACT | Status: DC
  Administered 2011-12-15 – 2011-12-16 (×2): 1 via RESPIRATORY_TRACT
  Filled 2011-12-14: qty 14

## 2011-12-14 MED ORDER — ONDANSETRON HCL 4 MG/2ML IJ SOLN
4.0000 mg | INTRAMUSCULAR | Status: DC | PRN
  Administered 2011-12-14 (×2): 4 mg via INTRAVENOUS
  Filled 2011-12-14: qty 2

## 2011-12-14 MED ORDER — ASPIRIN EC 81 MG PO TBEC
81.0000 mg | DELAYED_RELEASE_TABLET | Freq: Every day | ORAL | Status: DC
  Administered 2011-12-16: 81 mg via ORAL
  Filled 2011-12-14 (×4): qty 1

## 2011-12-14 MED ORDER — HYDROMORPHONE HCL PF 1 MG/ML IJ SOLN
1.0000 mg | Freq: Once | INTRAMUSCULAR | Status: AC
  Administered 2011-12-14: 1 mg via INTRAVENOUS
  Filled 2011-12-14: qty 1

## 2011-12-14 MED ORDER — KETOROLAC TROMETHAMINE 30 MG/ML IJ SOLN
30.0000 mg | Freq: Once | INTRAMUSCULAR | Status: AC
  Administered 2011-12-14: 30 mg via INTRAVENOUS
  Filled 2011-12-14: qty 1

## 2011-12-14 MED ORDER — OXYBUTYNIN CHLORIDE 5 MG PO TABS
5.0000 mg | ORAL_TABLET | Freq: Three times a day (TID) | ORAL | Status: DC
  Administered 2011-12-14: 5 mg via ORAL
  Filled 2011-12-14 (×5): qty 1

## 2011-12-14 MED ORDER — EZETIMIBE 10 MG PO TABS
10.0000 mg | ORAL_TABLET | Freq: Every day | ORAL | Status: DC
  Filled 2011-12-14 (×4): qty 1

## 2011-12-14 MED ORDER — AMOXICILLIN-POT CLAVULANATE 875-125 MG PO TABS
1.0000 | ORAL_TABLET | Freq: Two times a day (BID) | ORAL | Status: DC
  Filled 2011-12-14: qty 1

## 2011-12-14 MED ORDER — SENNOSIDES-DOCUSATE SODIUM 8.6-50 MG PO TABS
1.0000 | ORAL_TABLET | Freq: Two times a day (BID) | ORAL | Status: DC
  Filled 2011-12-14 (×6): qty 1

## 2011-12-14 MED ORDER — PROMETHAZINE HCL 25 MG/ML IJ SOLN
25.0000 mg | Freq: Four times a day (QID) | INTRAMUSCULAR | Status: DC | PRN
  Administered 2011-12-15 (×2): 25 mg via INTRAVENOUS
  Filled 2011-12-14 (×2): qty 1

## 2011-12-14 MED ORDER — FENOFIBRATE 160 MG PO TABS
160.0000 mg | ORAL_TABLET | Freq: Every day | ORAL | Status: DC
  Filled 2011-12-14 (×4): qty 1

## 2011-12-14 MED ORDER — TAMSULOSIN HCL 0.4 MG PO CAPS
0.4000 mg | ORAL_CAPSULE | Freq: Once | ORAL | Status: AC
  Administered 2011-12-14: 0.4 mg via ORAL
  Filled 2011-12-14: qty 1

## 2011-12-14 MED ORDER — ONDANSETRON HCL 4 MG/2ML IJ SOLN
4.0000 mg | Freq: Once | INTRAMUSCULAR | Status: AC
  Administered 2011-12-14: 4 mg via INTRAVENOUS
  Filled 2011-12-14: qty 2

## 2011-12-14 MED ORDER — PANTOPRAZOLE SODIUM 40 MG PO TBEC
40.0000 mg | DELAYED_RELEASE_TABLET | Freq: Every day | ORAL | Status: DC
  Filled 2011-12-14 (×3): qty 1

## 2011-12-14 MED ORDER — FOLIC ACID 1 MG PO TABS
1.0000 mg | ORAL_TABLET | Freq: Every day | ORAL | Status: DC
  Administered 2011-12-16: 1 mg via ORAL
  Filled 2011-12-14 (×4): qty 1

## 2011-12-14 MED ORDER — CIPROFLOXACIN HCL 500 MG PO TABS
500.0000 mg | ORAL_TABLET | Freq: Two times a day (BID) | ORAL | Status: DC
  Filled 2011-12-14: qty 1

## 2011-12-14 MED ORDER — LEVOFLOXACIN IN D5W 500 MG/100ML IV SOLN
500.0000 mg | INTRAVENOUS | Status: DC
  Administered 2011-12-15: 500 mg via INTRAVENOUS
  Filled 2011-12-14 (×2): qty 100

## 2011-12-14 MED ORDER — RAMELTEON 8 MG PO TABS
8.0000 mg | ORAL_TABLET | Freq: Every day | ORAL | Status: DC | PRN
  Filled 2011-12-14 (×2): qty 1

## 2011-12-14 MED ORDER — HYDROMORPHONE HCL PF 1 MG/ML IJ SOLN
1.0000 mg | Freq: Once | INTRAMUSCULAR | Status: DC
  Filled 2011-12-14: qty 1

## 2011-12-14 NOTE — Progress Notes (Signed)
Pt c/o increased nausea and has been unable to take ordered PO medications. Call placed to MD on call. Pt update given with s&s  & last medication doses given. Orders received to discontinue PO antibiotics Augmentin and Ciproflaxin. New order received for IV Levequin 500 mg daily and Phenerghan 25 mg every 6 hours prn nausea. Will continue to monitor pt status.

## 2011-12-14 NOTE — ED Notes (Signed)
Patient transported to X-ray 

## 2011-12-14 NOTE — H&P (Signed)
Urology History and Physical Exam  CC: Right UPJ stone  HPI: 46 year old male with right UPJ stone. He had a right ureter stent placed previously by Dr. Laverle Patter on 12/02/11. He also developed pneumonia and is undergoing treatment for that.  He woke today with pain. It was sharp. It was located in the right flank. It radiated to the right abdomen. It was slightly improved by hydrocodone. It was not initially associated with nausea, but that developed later.  He presented to the ER where a KUB shows the proximal end of the right ureter stent is distal to the stone. His pain is controlled with dilaudid, but now he has nausea and lightheadedness he feels is from dilaudid. He has some LE and WBC in his UA, mild leukocytosis, and Cr is 1.26. I reviewed options with him including stent exchange, admission for pain & nausea control or discharge home with more powerful pain & nausea medication. I do not think it would be a good idea to go after the stone now as he is still being treated for the pneumonia.  We discussed the risks & benefits of each and he would like admission.  PMH: Past Medical History  Diagnosis Date  . Arthritis   . Asthma   . Allergic arthritis   . Renal disorder   . History of nephrolithiasis     PSH: Past Surgical History  Procedure Date  . Cystoscopy w/ ureteral stent placement 12/02/2011    Procedure: CYSTOSCOPY WITH RETROGRADE PYELOGRAM/URETERAL STENT PLACEMENT;  Surgeon: Kathi Ludwig, MD;  Location: [facility];  Service: Urology;  Laterality: Right;    Allergies: No Known Allergies  Medications:  (Not in a hospital admission)   Social History: History   Social History  . Marital Status: Single    Spouse Name: N/A    Number of Children: N/A  . Years of Education: N/A   Occupational History  . Not on file.   Social History Main Topics  . Smoking status: Former Games developer  . Smokeless tobacco: Never Used  . Alcohol Use: Yes  . Drug Use: No  . Sexually  Active: No   Other Topics Concern  . Not on file   Social History Narrative  . No narrative on file    Family History: Family History  Problem Relation Age of Onset  . Hypertension    . Coronary artery disease      Review of Systems: Positive: Nausea, pain, dizziness. Negative: Chest pain, SOB, rash.  A further 10 point review of systems was negative except what is listed in the HPI.  Physical Exam:  General: No acute distress.  Awake. Head:  Normocephalic.  Atraumatic. ENT:  EOMI.  Mucous membranes moist Neck:  Supple.  No lymphadenopathy. CV:  S1 present. S2 present. Regular rate. Pulmonary: Equal effort bilaterally.  Clear to auscultation bilaterally. Abdomen: Soft.  Non- tender to palpation. Skin:  Normal turgor.  No visible rash. Extremity: No gross deformity of bilateral upper extremities.  No gross deformity of    bilateral lower extremities. Neurologic: Alert. Appropriate mood.  Penis:  Uncircumcised.  No lesions. Scrotum: No lesions.  No ecchymosis.  No erythema.  Studies:  Recent Labs  Basename 12/14/11 1230   HGB 15.8   WBC 16.4*   PLT 374    Recent Labs  Basename 12/14/11 1230   NA 138   K 3.6   CL 99   CO2 29   BUN 19   CREATININE 1.26  CALCIUM 9.6   GFRNONAA 66*   GFRAA 77*     No results found for this basename: PT:2,INR:2,APTT:2 in the last 72 hours   No components found with this basename: ABG:2    Assessment:  Right UPJ stone. Nausea.  Plan: -Admit for nausea. -IV hydration -NPO at midnight -No plan for surgery at this time.

## 2011-12-14 NOTE — Progress Notes (Signed)
ED CM spoke with pt who confirms pcp is Ralene Ok

## 2011-12-14 NOTE — ED Notes (Addendum)
RT ureteral stent placed last week by Dr. Patsi Sears.  Pt awoke this am around 0700 w/severe pain to RT side.  Also c/o nausea, some hematuria, abdominal pressure.

## 2011-12-14 NOTE — ED Provider Notes (Signed)
History     CSN: [id number]  Arrival date & time 12/14/11  1201   First MD Initiated Contact with Patient 12/14/11 1214      Chief Complaint  Patient presents with  . Flank Pain    RT side    (Consider location/radiation/quality/duration/timing/severity/associated sxs/prior treatment) HPI Comments: Has history of kidney stones.  Had stent placed last week.  Having severe pain that is not relieved with home meds.  No fevers.  Denies dysuria.  Patient is a 46 y.o. male presenting with flank pain. The history is provided by the patient.  Flank Pain Chronicity: ongoing. The problem occurs constantly. The problem has been gradually worsening. Associated symptoms include abdominal pain. The symptoms are aggravated by nothing. The symptoms are relieved by nothing.    Past Medical History  Diagnosis Date  . Arthritis   . Asthma   . Allergic arthritis   . Renal disorder   . History of nephrolithiasis     Past Surgical History  Procedure Date  . Cystoscopy w/ ureteral stent placement 12/02/2011    Procedure: CYSTOSCOPY WITH RETROGRADE PYELOGRAM/URETERAL STENT PLACEMENT;  Surgeon: Kathi Ludwig, MD;  Location: [facility];  Service: Urology;  Laterality: Right;    Family History  Problem Relation Age of Onset  . Hypertension    . Coronary artery disease      History  Substance Use Topics  . Smoking status: Former Games developer  . Smokeless tobacco: Not on file  . Alcohol Use: Yes      Review of Systems  Gastrointestinal: Positive for abdominal pain.  Genitourinary: Positive for flank pain.  All other systems reviewed and are negative.    Allergies  Review of patient's allergies indicates no known allergies.  Home Medications   Current Outpatient Rx  Name Route Sig Dispense Refill  . ABATACEPT 125 MG/ML [place] SOLN Subcutaneous Inject 125 mg into the skin once a week. On sundays    . AMOXICILLIN-POT CLAVULANATE 875-125 MG PO TABS Oral Take 1 tablet by mouth 2 (two) times  daily. 28 tablet 0  . VITAMIN C PO Oral Take 1 tablet by mouth daily.    . ASPIRIN EC 81 MG PO TBEC Oral Take 81 mg by mouth daily.    . AZELASTINE HCL 137 MCG/SPRAY NA SOLN Nasal Place 1 spray into the nose 2 (two) times daily. Use in each nostril as directed    . CIPROFLOXACIN HCL 500 MG PO TABS Oral Take 1 tablet (500 mg total) by mouth 2 (two) times daily. 28 tablet 0  . EZETIMIBE 10 MG PO TABS Oral Take 10 mg by mouth daily.    . FENOFIBRATE 160 MG PO TABS Oral Take 160 mg by mouth daily.    Marland Kitchen FLUTICASONE PROPIONATE 50 MCG/ACT NA SUSP Nasal Place 2 sprays into the nose daily.    Marland Kitchen FLUTICASONE-SALMETEROL 115-21 MCG/ACT IN AERO Inhalation Inhale 2 puffs into the lungs 2 (two) times daily.    Marland Kitchen FOLIC ACID 1 MG PO TABS Oral Take 1 mg by mouth daily.    Marland Kitchen HYDROCODONE-ACETAMINOPHEN 5-300 MG PO TABS Oral Take 1-2 tablets by mouth every 6 (six) hours. 25 each 0  . ADULT MULTIVITAMIN W/MINERALS CH Oral Take 1 tablet by mouth daily.    Marland Kitchen PANTOPRAZOLE SODIUM 40 MG PO TBEC Oral Take 40 mg by mouth daily.    Marland Kitchen PREDNISONE 20 MG PO TABS  3 tabs x 3 days, then 2 tabs x 3 days, then 1tabs x 3 days,  then stop 18 tablet 0  . RAMELTEON 8 MG PO TABS Oral Take 8 mg by mouth daily as needed. For sleep      BP 143/93  Pulse 72  Temp(Src) 97.9 F (36.6 C) (Oral)  Resp 18  Ht 5\' 10"  (1.778 m)  Wt 170 lb (77.111 kg)  BMI 24.39 kg/m2  SpO2 100%  Physical Exam  Nursing note and vitals reviewed. Constitutional: He is oriented to person, place, and time. He appears well-developed and well-nourished. He appears distressed.       Appears uncomfortable.  HENT:  Head: Normocephalic and atraumatic.  Neck: Normal range of motion. Neck supple.  Cardiovascular: Normal rate and regular rhythm.   No murmur heard. Pulmonary/Chest: Effort normal and breath sounds normal. No respiratory distress.  Abdominal: Soft. Bowel sounds are normal. He exhibits no distension. There is no tenderness.  Neurological: He is alert  and oriented to person, place, and time.  Skin: Skin is warm and dry.    ED Course  Procedures (including critical care time)   Labs Reviewed  URINALYSIS, ROUTINE W REFLEX MICROSCOPIC  CBC  BASIC METABOLIC PANEL   No results found.   No diagnosis found.    MDM  The patient presents with refractory kidney stone/stent pain.  I have spoken with Dr. Margarita Grizzle who recommends several medications which were given.  He is not feeling any better and is now having nausea and dizziness that is not responding to zofran.  I have reconsulted Dr. Margarita Grizzle who is in the or.  I asked the nurse to please have him see the patient when the case is completed.  At this point, care will be turned over to Dr. Nino Parsley at signout.          Geoffery Lyons, MD 12/14/11 [phone]

## 2011-12-15 ENCOUNTER — Encounter (HOSPITAL_COMMUNITY): Payer: Self-pay | Admitting: Anesthesiology

## 2011-12-15 ENCOUNTER — Inpatient Hospital Stay (HOSPITAL_COMMUNITY): Payer: 59 | Admitting: Anesthesiology

## 2011-12-15 ENCOUNTER — Encounter (HOSPITAL_COMMUNITY)
Admission: EM | Disposition: A | Discharge status: Home / Self Care | Payer: Self-pay | Source: Home / Self Care | Attending: Urology

## 2011-12-15 HISTORY — PX: CYSTOSCOPY/RETROGRADE/URETEROSCOPY: SHX5316

## 2011-12-15 HISTORY — PX: CYSTOSCOPY W/ URETERAL STENT PLACEMENT: SHX1429

## 2011-12-15 LAB — CBC
Hemoglobin: 13.5 g/dL (ref 13.0–17.0)
MCH: 32.6 pg (ref 26.0–34.0)
MCV: 100 fL (ref 78.0–100.0)
RBC: 4.14 MIL/uL — ABNORMAL LOW (ref 4.22–5.81)

## 2011-12-15 LAB — BASIC METABOLIC PANEL
CO2: 26 mEq/L (ref 19–32)
Chloride: 104 mEq/L (ref 96–112)
Glucose, Bld: 91 mg/dL (ref 70–99)
Potassium: 3.9 mEq/L (ref 3.5–5.1)
Sodium: 138 mEq/L (ref 135–145)

## 2011-12-15 SURGERY — CYSTOSCOPY, FLEXIBLE, WITH STENT REPLACEMENT
Anesthesia: General | Site: Ureter | Laterality: Right | Wound class: Clean Contaminated

## 2011-12-15 MED ORDER — SODIUM CHLORIDE 0.9 % IR SOLN
Status: DC | PRN
  Administered 2011-12-15: 1000 mL

## 2011-12-15 MED ORDER — AMOXICILLIN-POT CLAVULANATE 875-125 MG PO TABS
1.0000 | ORAL_TABLET | Freq: Two times a day (BID) | ORAL | Status: DC
  Administered 2011-12-15 – 2011-12-16 (×3): 1 via ORAL
  Filled 2011-12-15 (×5): qty 1

## 2011-12-15 MED ORDER — IOHEXOL 300 MG/ML  SOLN
INTRAMUSCULAR | Status: AC
  Filled 2011-12-15: qty 1

## 2011-12-15 MED ORDER — OXYBUTYNIN CHLORIDE 5 MG PO TABS: 5.0000 mg | ORAL_TABLET | Freq: Three times a day (TID) | ORAL | Status: DC | PRN

## 2011-12-15 MED ORDER — LIDOCAINE HCL 2 % EX GEL
CUTANEOUS | Status: DC | PRN
  Administered 2011-12-15: 1

## 2011-12-15 MED ORDER — LACTATED RINGERS IV SOLN: INTRAVENOUS | Status: DC

## 2011-12-15 MED ORDER — SENNOSIDES-DOCUSATE SODIUM 8.6-50 MG PO TABS: 1.0000 | ORAL_TABLET | Freq: Two times a day (BID) | ORAL | Status: DC

## 2011-12-15 MED ORDER — FENTANYL CITRATE 0.05 MG/ML IJ SOLN
INTRAMUSCULAR | Status: DC | PRN
  Administered 2011-12-15: 100 ug via INTRAVENOUS
  Administered 2011-12-15 (×3): 50 ug via INTRAVENOUS

## 2011-12-15 MED ORDER — SODIUM CHLORIDE 0.9 % IR SOLN
Status: DC | PRN
  Administered 2011-12-15: 3000 mL

## 2011-12-15 MED ORDER — ONDANSETRON HCL 4 MG/2ML IJ SOLN
INTRAMUSCULAR | Status: DC | PRN
  Administered 2011-12-15: 4 mg via INTRAVENOUS

## 2011-12-15 MED ORDER — LACTATED RINGERS IV SOLN
INTRAVENOUS | Status: DC | PRN
  Administered 2011-12-15: 07:00:00 via INTRAVENOUS

## 2011-12-15 MED ORDER — 0.9 % SODIUM CHLORIDE (POUR BTL) OPTIME
TOPICAL | Status: DC | PRN
  Administered 2011-12-15: 1000 mL

## 2011-12-15 MED ORDER — CEFAZOLIN SODIUM 1-5 GM-% IV SOLN
INTRAVENOUS | Status: DC | PRN
  Administered 2011-12-15: 1 g via INTRAVENOUS

## 2011-12-15 MED ORDER — IOHEXOL 300 MG/ML  SOLN
INTRAMUSCULAR | Status: DC | PRN
  Administered 2011-12-15: 50 mL

## 2011-12-15 MED ORDER — CIPROFLOXACIN HCL 500 MG PO TABS
500.0000 mg | ORAL_TABLET | Freq: Two times a day (BID) | ORAL | Status: DC
  Administered 2011-12-16: 500 mg via ORAL
  Filled 2011-12-15 (×3): qty 1

## 2011-12-15 MED ORDER — BELLADONNA ALKALOIDS-OPIUM 16.2-60 MG RE SUPP
RECTAL | Status: AC
  Filled 2011-12-15: qty 1

## 2011-12-15 MED ORDER — FENTANYL CITRATE 0.05 MG/ML IJ SOLN: 25.0000 ug | INTRAMUSCULAR | Status: DC | PRN

## 2011-12-15 MED ORDER — OXYCODONE-ACETAMINOPHEN 5-325 MG PO TABS: 1.0000 | ORAL_TABLET | ORAL | Status: AC | PRN

## 2011-12-15 MED ORDER — LIDOCAINE HCL 2 % EX GEL
CUTANEOUS | Status: AC
  Filled 2011-12-15: qty 10

## 2011-12-15 MED ORDER — ACETAMINOPHEN 10 MG/ML IV SOLN
INTRAVENOUS | Status: AC
  Filled 2011-12-15: qty 100

## 2011-12-15 MED ORDER — CEFAZOLIN SODIUM 1-5 GM-% IV SOLN
INTRAVENOUS | Status: AC
  Filled 2011-12-15: qty 50

## 2011-12-15 MED ORDER — PROMETHAZINE HCL 25 MG/ML IJ SOLN: 6.2500 mg | INTRAMUSCULAR | Status: DC | PRN

## 2011-12-15 MED ORDER — ACETAMINOPHEN 10 MG/ML IV SOLN
INTRAVENOUS | Status: DC | PRN
  Administered 2011-12-15: 1000 mg via INTRAVENOUS

## 2011-12-15 MED ORDER — MIDAZOLAM HCL 5 MG/5ML IJ SOLN
INTRAMUSCULAR | Status: DC | PRN
  Administered 2011-12-15: 2 mg via INTRAVENOUS

## 2011-12-15 MED ORDER — ONDANSETRON HCL 4 MG PO TABS: 4.0000 mg | ORAL_TABLET | ORAL | Status: AC | PRN

## 2011-12-15 MED ORDER — MEPERIDINE HCL 50 MG/ML IJ SOLN: 6.2500 mg | INTRAMUSCULAR | Status: DC | PRN

## 2011-12-15 MED ORDER — PROPOFOL 10 MG/ML IV BOLUS
INTRAVENOUS | Status: DC | PRN
  Administered 2011-12-15: 150 mg via INTRAVENOUS

## 2011-12-15 MED ORDER — BELLADONNA ALKALOIDS-OPIUM 16.2-60 MG RE SUPP
RECTAL | Status: DC | PRN
  Administered 2011-12-15: 1 via RECTAL

## 2011-12-15 MED ORDER — DEXAMETHASONE SODIUM PHOSPHATE 10 MG/ML IJ SOLN
INTRAMUSCULAR | Status: DC | PRN
  Administered 2011-12-15: 10 mg via INTRAVENOUS

## 2011-12-15 SURGICAL SUPPLY — 29 items
ADAPTER CATH URET PLST 4-6FR (CATHETERS) ×2 IMPLANT
ADPR CATH URET STRL DISP 4-6FR (CATHETERS) ×1
APL SKNCLS STERI-STRIP NONHPOA (GAUZE/BANDAGES/DRESSINGS) ×1
BAG URO CATCHER STRL LF (DRAPE) ×2 IMPLANT
BASKET ZERO TIP NITINOL 2.4FR (BASKET) ×1 IMPLANT
BENZOIN TINCTURE PRP APPL 2/3 (GAUZE/BANDAGES/DRESSINGS) ×1 IMPLANT
BSKT STON RTRVL ZERO TP 2.4FR (BASKET) ×1
CATH INTERMIT  6FR 70CM (CATHETERS) IMPLANT
CATH URET 5FR 28IN OPEN ENDED (CATHETERS) ×2 IMPLANT
CLOTH BEACON ORANGE TIMEOUT ST (SAFETY) ×2 IMPLANT
DRAPE CAMERA CLOSED 9X96 (DRAPES) ×2 IMPLANT
DRSG TEGADERM 2-3/8X2-3/4 SM (GAUZE/BANDAGES/DRESSINGS) ×1 IMPLANT
GLOVE BIOGEL PI IND STRL 7.5 (GLOVE) ×1 IMPLANT
GLOVE BIOGEL PI INDICATOR 7.5 (GLOVE) ×1
GLOVE ECLIPSE 7.5 STRL STRAW (GLOVE) ×2 IMPLANT
GLOVE SURG SS PI 8.0 STRL IVOR (GLOVE) ×2 IMPLANT
GOWN PREVENTION PLUS XLARGE (GOWN DISPOSABLE) ×4 IMPLANT
GOWN STRL NON-REIN LRG LVL3 (GOWN DISPOSABLE) ×2 IMPLANT
GOWN STRL REIN XL XLG (GOWN DISPOSABLE) ×2 IMPLANT
GUIDEWIRE ANG ZIPWIRE 038X150 (WIRE) IMPLANT
GUIDEWIRE STR DUAL SENSOR (WIRE) ×3 IMPLANT
LASER FIBER DISP (UROLOGICAL SUPPLIES) ×1 IMPLANT
MANIFOLD NEPTUNE II (INSTRUMENTS) ×2 IMPLANT
MARKER SKIN DUAL TIP RULER LAB (MISCELLANEOUS) ×1 IMPLANT
PACK CYSTO (CUSTOM PROCEDURE TRAY) ×2 IMPLANT
SET CYSTO W/LG BORE CLAMP LF (SET/KITS/TRAYS/PACK) ×1 IMPLANT
SET IRRIG Y TYPE TUR BLADDER L (SET/KITS/TRAYS/PACK) ×1 IMPLANT
STENT CONTOUR 6FRX28X.038 (STENTS) ×1 IMPLANT
TUBING CONNECTING 10 (TUBING) ×2 IMPLANT

## 2011-12-15 NOTE — Anesthesia Postprocedure Evaluation (Signed)
  Anesthesia Post-op Note  Patient: Stuart Collins  Procedure(s) Performed: Procedure(s) (LRB): CYSTOSCOPY WITH STENT REPLACEMENT (Right) CYSTOSCOPY/RETROGRADE/URETEROSCOPY (Right) HOLMIUM LASER APPLICATION (Right)  Patient Location: PACU  Anesthesia Type: General  Level of Consciousness: awake and alert   Airway and Oxygen Therapy: Patient Spontanous Breathing  Post-op Pain: mild  Post-op Assessment: Post-op Vital signs reviewed, Patient's Cardiovascular Status Stable, Respiratory Function Stable, Patent Airway and No signs of Nausea or vomiting  Post-op Vital Signs: stable  Complications: No apparent anesthesia complications

## 2011-12-15 NOTE — Discharge Instructions (Signed)
DISCHARGE INSTRUCTIONS FOR KIDNEY STONES OR URETERAL STENT  MEDICATIONS:   1. DO NOT RESUME YOUR ASPIRIN, or any other medicines like ibuprofen, motrin, excedrin, advil, aleve, vitamin E, fish oil as these can all cause bleeding x 7 days.  2. Resume all your other meds from home - except do not take any other pain meds that you may have at home.  ACTIVITY 1. No strenuous activity x 1week 2. No driving while on narcotic pain medications 3. Drink plenty of water 4. Continue to walk at home - you can still get blood clots when you are at home, so keep active, but don't over do it. 5. May return to work in 3 days.  BATHING 1. You can shower. 2. If you have a string coming from your urethra:  The stent string is attached to your ureteral stent.  Do not pull on this.  If the stent gets pulled our partially before it is time to remove it, go ahead and remove the entire stent.  Call if you develop significant pain that lasts more than several hours.    SIGNS/SYMPTOMS TO CALL: 1. Please call us if you have a fever greater than 101.5, uncontrolled  nausea/vomiting, uncontrolled pain, dizziness, unable to urinate, chest pain, shortness of breath, leg swelling, leg pain, redness around wound, drainage from wound, or any other concerns or questions.  You can reach [place] at [phone].  FOLLOW-UP 1. If you have a string attached to your stent, you may remove it on early in the morning on Thursday, 12/20/11.  To do this, pull the string until the stent is completely removed.  You may feel an odd sensation in your back.

## 2011-12-15 NOTE — Preoperative (Signed)
Beta Blockers   Reason not to administer Beta Blockers:Not Applicable 

## 2011-12-15 NOTE — Transfer of Care (Signed)
Immediate Anesthesia Transfer of Care Note  Patient: Stuart Collins  Procedure(s) Performed: Procedure(s) (LRB): CYSTOSCOPY WITH STENT REPLACEMENT (Right) CYSTOSCOPY/RETROGRADE/URETEROSCOPY (Right) HOLMIUM LASER APPLICATION (Right)  Patient Location: PACU  Anesthesia Type: General  Level of Consciousness: awake, alert , oriented and patient cooperative  Airway & Oxygen Therapy: Patient Spontanous Breathing and Patient connected to face mask oxygen  Post-op Assessment: Report given to PACU RN and Post -op Vital signs reviewed and stable  Post vital signs: Reviewed and stable  Complications: No apparent anesthesia complications

## 2011-12-15 NOTE — Brief Op Note (Signed)
12/14/2011 - 12/15/2011  8:54 AM  PATIENT:  Stuart Collins  46 y.o. male  PRE-OPERATIVE DIAGNOSIS:  right obstructiong ureter stone  POST-OPERATIVE DIAGNOSIS:  right obstructing ureter stone  PROCEDURE:  Procedure(s) (LRB): CYSTOSCOPY Right ureteroscopy Laser lithotripsy Fluoroscopy Right ureter stent removal  Basket stone extraction Right ureter stent placement  SURGEON:  Surgeon(s) and Role:    * Milford Cage, MD - Primary  PHYSICIAN ASSISTANT:   ASSISTANTS: none   ANESTHESIA:   general  EBL:  Total I/O In: 1400 [I.V.:1400] Out: -   BLOOD ADMINISTERED:none  DRAINS: none   LOCAL MEDICATIONS USED:  LIDOCAINE  and Amount: 10 ml urethral jelly   SPECIMEN:  Source of Specimen:  right ureter stone  DISPOSITION OF SPECIMEN:  [facility] lab for analysis  COUNTS:  YES  TOURNIQUET:  * No tourniquets in log *  DICTATION: .Other Dictation: Dictation Number [id number]  PLAN OF CARE: Admit to inpatient   PATIENT DISPOSITION:  PACU - hemodynamically stable.   Delay start of Pharmacological VTE agent (>24hrs) due to surgical blood loss or risk of bleeding: no

## 2011-12-15 NOTE — Anesthesia Preprocedure Evaluation (Addendum)
Anesthesia Evaluation  Patient identified by MRN, date of birth, ID band Patient awake    Reviewed: Allergy & Precautions, H&P , NPO status , Patient's Chart, lab work & pertinent test results  Airway Mallampati: III TM Distance: >3 FB Neck ROM: full    Dental No notable dental hx. (+) Teeth Intact and Dental Advisory Given   Pulmonary asthma , pneumonia  (resolved),  breath sounds clear to auscultation  Pulmonary exam normal       Cardiovascular Exercise Tolerance: Good negative cardio ROS  Rhythm:regular Rate:Normal  Normal echo 3/17 to rule out CHF as cause of low sats   Neuro/Psych  Headaches, negative neurological ROS  negative psych ROS   GI/Hepatic negative GI ROS, Neg liver ROS, GERD-  Medicated and Controlled,  Endo/Other  negative endocrine ROS  Renal/GU negative Renal ROS  negative genitourinary   Musculoskeletal   Abdominal   Peds  Hematology negative hematology ROS (+)   Anesthesia Other Findings   Reproductive/Obstetrics negative OB ROS                          Anesthesia Physical  Anesthesia Plan  ASA: II  Anesthesia Plan: General   Post-op Pain Management:    Induction: Intravenous  Airway Management Planned:   Additional Equipment:   Intra-op Plan:   Post-operative Plan:   Informed Consent: I have reviewed the patients History and Physical, chart, labs and discussed the procedure including the risks, benefits and alternatives for the proposed anesthesia with the patient or authorized representative who has indicated his/her understanding and acceptance.   Dental Advisory Given  Plan Discussed with: CRNA and Surgeon  Anesthesia Plan Comments:        Anesthesia Quick Evaluation

## 2011-12-15 NOTE — Progress Notes (Signed)
Urology Progress Note  Subjective:     No acute urologic events overnight. Patient with continued pain and significant nausea overnight.  Renal function worse today. His CXR has cleared and he has minimal cough, no fever. I discussed case with anesthesiologist, Dr. Willa Frater, who agrees that the patient is not at high risk for the slightly longer anesthetic time needed for ureteroscopy.  I discussed with patient exchanging the stent versus ureteroscopy with laser lithotripsy along with the risks, benefits, alternatives, and likelihood of achieving goals.  ROS: No SOB.  Objective:  Patient Vitals for the past 24 hrs:  BP Temp Temp src Pulse Resp SpO2 Height Weight  12/14/11 2146 133/88 mmHg 98 F (36.7 C) Oral 54  20  100 % 5\' 10"  (1.778 m) 77.111 kg (170 lb)  12/14/11 2015 155/102 mmHg - - 85  - 100 % - -  12/14/11 2000 130/87 mmHg - - 65  - 98 % - -  12/14/11 1945 131/81 mmHg - - 64  - 99 % - -  12/14/11 1930 130/84 mmHg - - 65  - 98 % - -  12/14/11 1915 133/83 mmHg - - 63  - 98 % - -  12/14/11 1900 129/86 mmHg - - 66  - 99 % - -  12/14/11 1845 136/81 mmHg - - 69  - 100 % - -  12/14/11 1830 126/77 mmHg - - 63  - 99 % - -  12/14/11 1815 127/83 mmHg - - 82  - 100 % - -  12/14/11 1800 133/75 mmHg - - 65  - 99 % - -  12/14/11 1745 133/83 mmHg - - 67  - 99 % - -  12/14/11 1730 132/88 mmHg - - 67  - 99 % - -  12/14/11 1715 150/93 mmHg - - 70  - 100 % - -  12/14/11 1700 146/89 mmHg - - 75  - 100 % - -  12/14/11 1648 135/91 mmHg - - 71  - 98 % - -  12/14/11 1510 - - - 66  - 100 % - -  12/14/11 1509 136/89 mmHg 98.6 F (37 C) Oral 70  18  97 % - -  12/14/11 1509 136/89 mmHg - - - - - - -  12/14/11 1215 143/93 mmHg - - - - - - -  12/14/11 1210 148/103 mmHg 97.9 F (36.6 C) Oral 72  18  100 % 5\' 10"  (1.778 m) 77.111 kg (170 lb)    Physical Exam: General:  No acute distress, awake Cardiovascular:    [x]   S1/S2 present, RRR  []   Irregularly irregular Chest:  CTA-B Abdomen:      []  Soft, appropriately TTP  [x]  Soft, NTTP  []  Soft, appropriately TTP, incision(s) clean/dry/intact  Genitourinary: No foley    I/O last 3 completed shifts: In: 1000 [I.V.:1000] Out: -   Recent Labs  [facility] 12/15/11 0439 12/14/11 1230   HGB 13.5 15.8   WBC 14.2* 16.4*   PLT 274 374    Recent Labs  Basename 12/15/11 0439 12/14/11 1230   NA 138 138   K 3.9 3.6   CL 104 99   CO2 26 29   BUN 20 19   CREATININE 1.52* 1.26   CALCIUM 8.7 9.6   GFRNONAA 53* 66*   GFRAA 61* 77*     No results found for this basename: PT:2,INR:2,APTT:2 in the last 72 hours   No components found with this basename: ABG:2  Length of stay: 1 days.  Assessment: Right obstructing ureter stone.  Plan: -The patient has chosen to go to the OR for cystoscopy, right ureteroscopy, laser lithotripsy, and right ureter stent exchange. Correct side marked (right side).   Natalia Leatherwood, MD [phone]

## 2011-12-16 NOTE — Discharge Summary (Signed)
Physician Discharge Summary  Patient ID: Stuart Collins MRN: [id number] DOB/AGE: [birth year] 48 y.o.  Admit date: 12/14/2011 Discharge date: 12/16/2011  Admission Diagnoses: Nausea. Right ureter stone  Discharge Diagnoses:  Active Problems:  Nausea Right ureter stone  Discharged Condition: good  Hospital Course:  Patient admitted due to nausea. He has a known history of right ureter stone with a stent in place. X-ray showed the stent had migrated beyond the stone. The patient elected for hospitalization to try to control his nausea. Control of his nausea and pain overnight was not adequate despite multiple medications. The following day he elected for removal of the stone. This was completed with ureteroscopy. He was kept over night for observation, IV hydration, and pain control following the surgery. The next day he was able to be discharged home.  Consults: None  Significant Diagnostic Studies: radiology: KUB: Right ureter stent migration.  Treatments: IV hydration, analgesia: Dilaudid and fentanyl and surgery: Right ureteroscopy and laser lithotripsy with right ureter stent placement.  Discharge Exam: Blood pressure 119/69, pulse 78, temperature 97.4 F (36.3 C), temperature source Oral, resp. rate 18, height 5\' 10"  (1.778 m), weight 77.111 kg (170 lb), SpO2 99.00%. General appearance: alert and no distress Resp: equal effort bilaterally. GI: normal findings: soft, non-tender  Disposition: 01-Home or Self Care  Discharge Orders    Future Appointments: Provider: Department: Dept Phone: Center:   12/18/2011 10:15 AM Julio Sicks, NP [facility] [phone] None     Medication List  As of 12/16/2011  7:07 AM   STOP taking these medications         amoxicillin-clavulanate 875-125 MG per tablet      ciprofloxacin 500 MG tablet      Hydrocodone-Acetaminophen 5-300 MG Tabs         TAKE these medications         acetaminophen 500 MG tablet   Commonly known as: TYLENOL     Take 500 mg by mouth every 6 (six) hours as needed. For pain      aspirin EC 81 MG tablet   Take 81 mg by mouth daily.      azelastine 137 MCG/SPRAY nasal spray   Commonly known as: ASTELIN   Place 1 spray into the nose 2 (two) times daily. Use in each nostril as directed      ezetimibe 10 MG tablet   Commonly known as: ZETIA   Take 10 mg by mouth daily.      fenofibrate 160 MG tablet   Take 160 mg by mouth daily.      fluticasone 50 MCG/ACT nasal spray   Commonly known as: FLONASE   Place 2 sprays into the nose daily.      fluticasone-salmeterol 115-21 MCG/ACT inhaler   Commonly known as: ADVAIR HFA   Inhale 2 puffs into the lungs 2 (two) times daily.      folic acid 1 MG tablet   Commonly known as: FOLVITE   Take 1 mg by mouth daily.      mulitivitamin with minerals Tabs   Take 1 tablet by mouth daily.      ondansetron 4 MG tablet   Commonly known as: ZOFRAN   Take 1 tablet (4 mg total) by mouth every 4 (four) hours as needed for nausea.      ORENCIA 125 MG/ML Soln   Generic drug: Abatacept   Inject 125 mg into the skin once a week. On sundays      oxybutynin 5  MG tablet   Commonly known as: DITROPAN   Take 1 tablet (5 mg total) by mouth every 8 (eight) hours as needed.      oxyCODONE-acetaminophen 5-325 MG per tablet   Commonly known as: PERCOCET   Take 1-2 tablets by mouth every 4 (four) hours as needed.      pantoprazole 40 MG tablet   Commonly known as: PROTONIX   Take 40 mg by mouth daily.      predniSONE 20 MG tablet   Commonly known as: DELTASONE   3 tabs x 3 days, then  2 tabs x 3 days, then  1tabs x 3 days, then stop      ramelteon 8 MG tablet   Commonly known as: ROZEREM   Take 8 mg by mouth daily as needed. For sleep      senna-docusate 8.6-50 MG per tablet   Commonly known as: Senokot-S   Take 1 tablet by mouth 2 (two) times daily.      VITAMIN C PO   Take 1 tablet by mouth daily.           Follow-up Information    Follow up  with Milford Cage, MD on 12/20/2011. (1:30 pm)    Contact information:   [address] [facility] [address] [facility] [place] [id number] [phone]          Signed: Milford Cage 12/16/2011, 7:07 AM

## 2011-12-16 NOTE — Op Note (Signed)
Stuart Collins, Stuart Collins NO.:  [id number]  MEDICAL RECORD NO.:  [id number]  LOCATION:  1402                         FACILITY:  [facility]  PHYSICIAN:  Natalia Leatherwood, MD    DATE OF BIRTH:  [birth year]  DATE OF PROCEDURE:  12/15/2011 DATE OF DISCHARGE:                              OPERATIVE REPORT   SURGEON:  Natalia Leatherwood, MD  ASSISTANT:  None.  PREOPERATIVE DIAGNOSIS:  Right obstructing ureteral stone.  POSTOPERATIVE DIAGNOSIS:  Right obstructing ureteral stone.  PROCEDURE PERFORMED: 1. Cystoscopy. 2. Right ureteroscopy. 3. Right ureteral stent removal. 4. Laser lithotripsy. 5. Basket stone retrieval. 6. Fluoroscopy. 7. Right ureteral stent placement.  FINDINGS:  Proximal right obstructing ureteral stone.  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  COMPLICATIONS:  None.  ESTIMATED BLOOD LOSS:  None.  SPECIMEN:  Stone fragments sent to the [facility] for chemical analysis.  HISTORY OF PRESENT ILLNESS:  46 year old male who had a ureteral stent placement on December 02, 2011, for an obstructing stone.  He presented back to the ER on December 14, 2011, where it appeared that his right ureteral stent had migrated beyond the stone.  The patient has elected for observation overnight to try to control his pain; however, his nausea and pain were difficult to control.  He elected to go to the operating room today to remove his stone and exchange the stent.  PROCEDURE IN DETAIL:  After informed consent was obtained, the patient was taken to the operating room, where he was placed in supine position. IV antibiotics were infused and general anesthesia was induced.  He was then placed in a dorsal lithotomy position making make sure to pad all pertinent neurovascular pressure points appropriately.  Following this, his genitals were prepped and draped in usual sterile fashion.  A time- out was then performed in which the correct patient, surgical  site, and procedure were all identified and agreed upon by the team.  Rigid cystoscope was advanced through the urethra into the bladder.  A sensor tip wire was placed up along the right ureteral stent with good curl noted in the right renal pelvis on fluoroscopy.  Next, a grasper was used to grasp the ureteral stent and then was brought out to the urethral meatus.  It was noted to have a fair amount of incrustation given the short period of time being indwelling.  A sensor tip wire was placed through the lumen of the stent up into the right renal pelvis on fluoroscopy with a good curl.  The stent was then removed and one wire was secured as a safety wire.  The other wire was used as a working wire, and a 12-14 ureteral access sheath was placed up over the wire and into the proximal ureter.  Digital flexible ureteroscope was then placed through the ureteral access sheath and was noted that the stone in the proximal ureter had been pushed back into the renal pelvis.  A 200 micron holmium laser filament was then placed and lithotripsy was carried out at 0.5 joules and 20 Hz.  The large fragments were removed with a Zero tip Nitinol basket and the remaining fragments were  too small to grasp, were left for passage.  Exploration of the remaining kidney revealed multiple Randall plaques and birthing stones.  None of the stones were appropriate for removal at this point, therefore no intervention was carried out upon these stones.  The ureteral access sheath was removed and the entire ureter was visualized.  There was no injury to the entire ureter.  Next, a 6 x 28 double-J ureteral stent with the strings in place was placed up over the wire with a good curl noted in the right renal pelvis and deployed into the bladder with good curl.  The bladder was irrigated out and then drained.  10 cc of lidocaine jelly were placed into the urethra and then a B and O suppository was placed in the rectum.   The strings were secured to the penis with Mastisol and Tegaderm.  The patient will be admitted back to his hospital room.          ______________________________ Natalia Leatherwood, MD     DW/MEDQ  D:  12/15/2011  T:  12/16/2011  Job:  [id number]

## 2011-12-18 ENCOUNTER — Ambulatory Visit (INDEPENDENT_AMBULATORY_CARE_PROVIDER_SITE_OTHER): Payer: 59 | Admitting: Adult Health

## 2011-12-18 ENCOUNTER — Encounter: Payer: Self-pay | Admitting: Adult Health

## 2011-12-18 VITALS — BP 116/88 | HR 81 | Temp 96.9°F | Ht 70.0 in | Wt 170.0 lb

## 2011-12-18 DIAGNOSIS — J45909 Unspecified asthma, uncomplicated: Secondary | ICD-10-CM

## 2011-12-18 DIAGNOSIS — J189 Pneumonia, unspecified organism: Secondary | ICD-10-CM

## 2011-12-18 NOTE — Patient Instructions (Signed)
Continue on current regimen.  follow up in 3-4 weeks with Dr. Sherene Sires  With chest xray

## 2011-12-18 NOTE — Assessment & Plan Note (Signed)
Clinically Improving, cxr is improved on 3/29. He has already finished steorid tpaer.  Will hold on further steroids as xray is improved and pt is clinically better.  follow up in 3 weeks with cxr.

## 2011-12-18 NOTE — Progress Notes (Signed)
Subjective:    Patient ID: Stuart Collins, male    DOB: Jul 14, 1966, 46 y.o.   MRN: [id number]  HPI 46 year old male, never smoker, with a history of allergic rhinitis and asthma, followed by [facility] . He also has a history of rheumatoid arthritis followed by Dr. Kellie Simmering on methotrexate. Patient was seen for initial pulmonary consult during hospitalization. 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  12/18/2011 Post Hospital follow up  Patient returns for a post hospital followup. Patient was admitted March 16 2 12/04/2011 for acute bronchitis and kidney stones. Prior to admission. Patient had had several treatments for upper respiratory infections with antibiotics and steroids. He had no improvement in symptoms and found to be hypoxic on arrival to the emergency room. Chest x-ray showed increased interstitial markings and airspace disease. Patient was suspected to have pneumonitis secondary to methotrexate. His methotrexate was stopped. He was started on a steroid challenge. He slowly improved with decreased markings on chest x-ray. His auscultation was also complicated by kidney stones. He was treated with antibiotics, and aggressive pulmonary hygiene. He also had a urinary tract infection and was discharged on Cipro and Augmentin. Unfortunately, patient was readmitted March 29 of March 31 for a right ureter stone. He underwent a stone removal, stent placed and tolerated procedure well. Chest x-ray done on March 29 showed decreased interstitial markings.  Since discharge. Patient is feeling better but still weak. Cough and dsypnea as decreased.  He was discharged on steroid taper which he is now finished- 1 week ago. No flare in cough off steroids.    Review of Systems Constitutional:   No  weight loss, night sweats,  Fevers, chills, + fatigue, or  lassitude.  HEENT:   No headaches,  Difficulty swallowing,  Tooth/dental problems, or  Sore throat,                No  sneezing, itching, ear ache, + nasal congestion, post nasal drip,   CV:  No chest pain,  Orthopnea, PND, swelling in lower extremities, anasarca, dizziness, palpitations, syncope.   GI  No heartburn, indigestion, abdominal pain, nausea, vomiting, diarrhea, change in bowel habits, loss of appetite, bloody stools.   Resp:   No coughing up of blood.  No change in color of mucus.  No wheezing.  No chest wall deformity  Skin: no rash or lesions.  GU: no dysuria, change in color of urine, no urgency or frequency.  No flank pain, no hematuria   MS:  No joint pain or swelling.  No decreased range of motion.  No back pain.  Psych:  No change in mood or affect. No depression or anxiety.  No memory loss.         Objective:   Physical Exam GEN: A/Ox3; pleasant , NAD, well nourished   HEENT:  [place]/AT,  EACs-clear, TMs-wnl, NOSE-clear drainage  THROAT-clear, no lesions, no postnasal drip or exudate noted.   NECK:  Supple w/ fair ROM; no JVD; normal carotid impulses w/o bruits; no thyromegaly or nodules palpated; no lymphadenopathy.  RESP  Coarse BS  w/o, wheezes/ rales/ or rhonchi.no accessory muscle use, no dullness to percussion  CARD:  RRR, no m/r/g  , no peripheral edema, pulses intact, no cyanosis or clubbing.  GI:   Soft & nt; nml bowel sounds; no organomegaly or masses detected.  Musco: Warm bil, no deformities or joint swelling noted.   Neuro: alert, no focal deficits noted.    Skin: Warm, no lesions or rashes  Assessment & Plan:

## 2012-01-07 ENCOUNTER — Other Ambulatory Visit: Payer: Self-pay | Admitting: Internal Medicine

## 2012-01-07 DIAGNOSIS — J189 Pneumonia, unspecified organism: Secondary | ICD-10-CM

## 2012-01-08 ENCOUNTER — Ambulatory Visit (INDEPENDENT_AMBULATORY_CARE_PROVIDER_SITE_OTHER)
Admission: RE | Admit: 2012-01-08 | Discharge: 2012-01-08 | Disposition: A | Discharge status: Home / Self Care | Payer: 59 | Source: Ambulatory Visit | Attending: Internal Medicine | Admitting: Internal Medicine

## 2012-01-08 ENCOUNTER — Ambulatory Visit (INDEPENDENT_AMBULATORY_CARE_PROVIDER_SITE_OTHER): Payer: 59 | Admitting: Internal Medicine

## 2012-01-08 ENCOUNTER — Encounter: Payer: Self-pay | Admitting: Internal Medicine

## 2012-01-08 DIAGNOSIS — J984 Other disorders of lung: Secondary | ICD-10-CM

## 2012-01-08 DIAGNOSIS — J189 Pneumonia, unspecified organism: Secondary | ICD-10-CM

## 2012-01-08 DIAGNOSIS — J45909 Unspecified asthma, uncomplicated: Secondary | ICD-10-CM

## 2012-01-08 MED ORDER — PREDNISONE (PAK) 10 MG PO TABS: ORAL_TABLET | ORAL | Status: AC

## 2012-01-08 MED ORDER — MOMETASONE FURO-FORMOTEROL FUM 100-5 MCG/ACT IN AERO: INHALATION_SPRAY | RESPIRATORY_TRACT | Status: DC

## 2012-01-08 NOTE — Assessment & Plan Note (Signed)
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  No evidence of recurrent pna, ok to continue mtx for now.

## 2012-01-08 NOTE — Progress Notes (Signed)
Subjective:    Patient ID: Stuart Collins, male    DOB: [birth year]   MRN: [id number]  HPI 46 year old male, never smoker, with a history of allergic rhinitis and asthma, followed by [facility] . He also has a history of rheumatoid arthritis followed by Dr. Kellie Simmering on methotrexate. Patient was seen for initial pulmonary consult during hospitalization. 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  12/18/2011 Post Hospital follow up  Patient returns for a post hospital followup. Patient was admitted March 16 2 12/04/2011 for acute bronchitis and kidney stones. Prior to admission. Patient had had several treatments for upper respiratory infections with antibiotics and steroids. He had no improvement in symptoms and found to be hypoxic on arrival to the emergency room. Chest x-ray showed increased interstitial markings and airspace disease. Patient was suspected to have pneumonitis secondary to methotrexate. His methotrexate was stopped. He was started on a steroid challenge. He slowly improved with decreased markings on chest x-ray. His auscultation was also complicated by kidney stones. He was treated with antibiotics, and aggressive pulmonary hygiene. He also had a urinary tract infection and was discharged on Cipro and Augmentin. Unfortunately, patient was readmitted March 29 of March 31 for a right ureter stone. He underwent a stone removal, stent placed and tolerated procedure well. Chest x-ray done on March 29 showed decreased interstitial markings.  Since discharge. Patient is feeling better but still weak. Cough and dsypnea as decreased.  He was discharged on steroid taper which he is now finished- 1 week ago. No flare in cough off steroids.  rec Continue on current regimen.  follow up in 3-4 weeks with Dr. Sherene Sires  With chest xray    01/08/2012 f/u ov/Laurin Morgenstern back on mtx x 2 weeks and sob x 1 week with some itching sneezing running nose present at rest, some dry cough and pnds when  lie down but doesn't bother sleep.  Arthritis moderately active but trending toward better.   No subject wheeze or perceived need for daytime saba which he no longer uses on maint adair.  Sleeping ok without nocturnal  or early am exacerbation  of respiratory  c/o's or need for noct saba. Also denies any obvious fluctuation of symptoms with weather or environmental changes or other aggravating or alleviating factors except as outlined above   ROS  At present neg for  any significant sore throat, dysphagia, dental problems, itching, sneezing,  nasal congestion or excess/ purulent secretions, ear ache,   fever, chills, sweats, unintended wt loss, pleuritic or exertional cp, hemoptysis, palpitations, orthopnea pnd or leg swelling.  Also denies presyncope, palpitations, heartburn, abdominal pain, anorexia, nausea, vomiting, diarrhea  or change in bowel or urinary habits, change in stools or urine, dysuria,hematuria,  rash,  visual complaints, headache, numbness weakness or ataxia or problems with walking or coordination. No noted change in mood/affect or memory.                          Objective:   Physical Exam  Wt Readings from Last 3 Encounters:  01/08/12 173 lb (78.472 kg)  12/18/11 170 lb (77.111 kg)  12/14/11 170 lb (77.111 kg)    GEN: A/Ox3; pleasant , NAD, well nourished   HEENT:  [place]/AT,  EACs-clear, TMs-wnl, NOSE-clear drainage  THROAT-clear, no lesions, no postnasal drip or exudate noted.   NECK:  Supple w/ fair ROM; no JVD; normal carotid impulses w/o bruits; no thyromegaly or nodules palpated; no lymphadenopathy.  RESP  Coarse BS  w/o, wheezes/ rales/ or rhonchi.no accessory muscle use, no dullness to percussion  CARD:  RRR, no m/r/g  , no peripheral edema, pulses intact, no cyanosis or clubbing.  GI:   Soft & nt; nml bowel sounds; no organomegaly or masses detected.  Musco: Warm bil, no deformities or joint swelling noted.    CXR  01/08/2012 :   Mild residual  streaky opacity in the right middle lobe or lingula with otherwise significantly resolved bilateral pneumonia since March. One additional follow-up chest x-ray to document full resolution is recommended.         Assessment & Plan:

## 2012-01-08 NOTE — Patient Instructions (Addendum)
Stop advair  Start dulera 100 Take 2 puffs first thing in am and then another 2 puffs about 12 hours later.   Prednisone 10 mg take  4 each am x 2 days,   2 each am x 2 days,  1 each am x2days and stop   Please schedule a follow up office visit in 4 weeks, sooner if needed

## 2012-01-08 NOTE — Assessment & Plan Note (Addendum)
DDX of  difficult airways managment all start with A and  include Adherence, Ace Inhibitors, Acid Reflux, Active Sinus Disease, Alpha 1 Antitripsin deficiency, Anxiety masquerading as Airways dz,  ABPA,  allergy(esp in young), Aspiration (esp in elderly), Adverse effects of DPI,  Active smokers, plus two Bs  = Bronchiectasis and Beta blocker use..and one C= CHF  Adherence is always the initial "prime suspect" and is a multilayered concern that requires a "trust but verify" approach in every patient - starting with knowing how to use medications, especially inhalers, correctly, keeping up with refills and understanding the fundamental difference between maintenance and prns vs those medications only taken for a very short course and then stopped and not refilled. The proper method of use, as well as anticipated side effects, of a metered-dose inhaler are discussed and demonstrated to the patient. Improved effectiveness after extensive coaching during this visit to a level of approximately  90%  ? Adverse effect of advair > try dulera 100 Take 2 puffs first thing in am and then another 2 puffs about 12 hours later.   ? Allergy related > short course of pred only

## 2012-01-09 NOTE — Progress Notes (Signed)
Quick Note:  Spoke with pt and notified of results per Dr. Wert. Pt verbalized understanding and denied any questions.  ______ 

## 2012-02-05 ENCOUNTER — Ambulatory Visit (INDEPENDENT_AMBULATORY_CARE_PROVIDER_SITE_OTHER): Payer: 59 | Admitting: Internal Medicine

## 2012-02-05 ENCOUNTER — Encounter: Payer: Self-pay | Admitting: Internal Medicine

## 2012-02-05 DIAGNOSIS — J45909 Unspecified asthma, uncomplicated: Secondary | ICD-10-CM

## 2012-02-05 DIAGNOSIS — J189 Pneumonia, unspecified organism: Secondary | ICD-10-CM

## 2012-02-05 NOTE — Progress Notes (Signed)
Subjective:    Patient ID: Stuart Collins, male    DOB: [birth year]   MRN: [id number]  HPI 46 year old male, never smoker, with a history of allergic rhinitis and asthma, followed by Stuart Collins . He also has a history of rheumatoid arthritis followed by Dr. Kellie Collins on methotrexate. Patient was seen for initial pulmonary consult during hospitalization. 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  12/18/2011 Post Hospital follow up  Patient returns for a post hospital followup. Patient was admitted March 16 2 12/04/2011 for acute bronchitis and kidney stones. Prior to admission. Patient had had several treatments for upper respiratory infections with antibiotics and steroids. He had no improvement in symptoms and found to be hypoxic on arrival to the emergency room. Chest x-ray showed increased interstitial markings and airspace disease. Patient was suspected to have pneumonitis secondary to methotrexate. His methotrexate was stopped. He was started on a steroid challenge. He slowly improved with decreased markings on chest x-ray. His auscultation was also complicated by kidney stones. He was treated with antibiotics, and aggressive pulmonary hygiene. He also had a urinary tract infection and was discharged on Cipro and Augmentin. Unfortunately, patient was readmitted March 29 of March 31 for a right ureter stone. He underwent a stone removal, stent placed and tolerated procedure well. Chest x-ray done on March 29 showed decreased interstitial markings.  Since discharge. Patient is feeling better but still weak. Cough and dsypnea as decreased.  He was discharged on steroid taper which he is now finished- 1 week ago. No flare in cough off steroids.  rec Continue on current regimen.  follow up in 3-4 weeks with Stuart Collins  With chest xray    01/08/2012 f/u ov/Armiyah Collins back on mtx x 2 weeks and sob x 1 week with some itching sneezing running nose present at rest, some dry cough and pnds when  lie down but doesn't bother sleep.  Arthritis moderately active but trending toward better.   No subject wheeze or perceived need for daytime saba which he no longer uses on maint adair. rec Stop advair Start dulera 100 Take 2 puffs first thing in am and then another 2 puffs about 12 hours later.  Prednisone 10 mg take  4 each am x 2 days,   2 each am x 2 days,  1 each am x2days and stop    02/05/2012 f/u ov/Stuart Collins cc much better but hasn't tried aerobics no need saba daytime, no cough  Sleeping ok without nocturnal  or early am exacerbation  of respiratory  c/o's or need for noct saba. Also denies any obvious fluctuation of symptoms with weather or environmental changes or other aggravating or alleviating factors except as outlined above   ROS  At present neg for  any significant sore throat, dysphagia, dental problems, itching, sneezing,  nasal congestion or excess/ purulent secretions, ear ache,   fever, chills, sweats, unintended wt loss, pleuritic or exertional cp, hemoptysis, palpitations, orthopnea pnd or leg swelling.  Also denies presyncope, palpitations, heartburn, abdominal pain, anorexia, nausea, vomiting, diarrhea  or change in bowel or urinary habits, change in stools or urine, dysuria,hematuria,  rash,  visual complaints, headache, numbness weakness or ataxia or problems with walking or coordination. No noted change in mood/affect or memory.                          Objective:   Physical Exam Wt 02/05/2012  177 Wt Readings from Last 3 Encounters:  01/08/12  173 lb (78.472 kg)  12/18/11 170 lb (77.111 kg)  12/14/11 170 lb (77.111 kg)    GEN: A/Ox3; pleasant , NAD, well nourished   HEENT:  [place]/AT,  EACs-clear, TMs-wnl, NOSE-clear drainage  THROAT-clear, no lesions, no postnasal drip or exudate noted.   NECK:  Supple w/ fair ROM; no JVD; normal carotid impulses w/o bruits; no thyromegaly or nodules palpated; no lymphadenopathy.  RESP  Coarse BS  w/o, wheezes/ rales/ or  rhonchi.no accessory muscle use, no dullness to percussion  CARD:  RRR, no m/r/g  , no peripheral edema, pulses intact, no cyanosis or clubbing.  GI:   Soft & nt; nml bowel sounds; no organomegaly or masses detected.  Musco: Warm bil, no deformities or joint swelling noted.    CXR  01/08/2012 :   Mild residual streaky opacity in the right middle lobe or lingula with otherwise significantly resolved bilateral pneumonia since March. One additional follow-up chest x-ray to document full resolution is recommended.         Assessment & Plan:

## 2012-02-05 NOTE — Patient Instructions (Signed)
Please schedule a follow up visit in 2 months but call sooner if needed with PFTs and cxr

## 2012-02-06 NOTE — Assessment & Plan Note (Signed)
All goals of chronic asthma control met including optimal function and elimination of symptoms with minimal need for rescue therapy.  Contingencies discussed in full including contacting this office immediately if not controlling the symptoms using the rule of two's.    

## 2012-02-06 NOTE — Assessment & Plan Note (Signed)
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  At risk of RA lung dz and mtx lung effects but doing great now - does need to repeat cxr and baseline pft's > scheduled for 04/2012

## 2012-04-07 ENCOUNTER — Ambulatory Visit (INDEPENDENT_AMBULATORY_CARE_PROVIDER_SITE_OTHER): Payer: 59 | Admitting: Internal Medicine

## 2012-04-07 ENCOUNTER — Encounter: Payer: Self-pay | Admitting: Internal Medicine

## 2012-04-07 ENCOUNTER — Ambulatory Visit (INDEPENDENT_AMBULATORY_CARE_PROVIDER_SITE_OTHER)
Admission: RE | Admit: 2012-04-07 | Discharge: 2012-04-07 | Disposition: A | Discharge status: Home / Self Care | Payer: 59 | Source: Ambulatory Visit | Attending: Internal Medicine | Admitting: Internal Medicine

## 2012-04-07 VITALS — BP 110/58 | HR 72 | Temp 98.0°F | Ht 70.0 in | Wt 177.0 lb

## 2012-04-07 DIAGNOSIS — J45909 Unspecified asthma, uncomplicated: Secondary | ICD-10-CM

## 2012-04-07 DIAGNOSIS — J189 Pneumonia, unspecified organism: Secondary | ICD-10-CM

## 2012-04-07 LAB — PULMONARY FUNCTION TEST

## 2012-04-07 NOTE — Progress Notes (Signed)
PFT Done. Bliss Tsang, CMA  

## 2012-04-07 NOTE — Assessment & Plan Note (Signed)
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 04/07/2012 PFT's wnl x mild truncation of insp loop  No evidence of significant ra lung dz or mtx toxicity/ f/u in one year, sooner if needed

## 2012-04-07 NOTE — Assessment & Plan Note (Signed)
-   HFA 90% 01/08/2012    - PFT's 04/07/2012 FEV1  3.31 (91%) ratio 82 and DLCO 86% - mild truncation of insp loop  All goals of chronic asthma control met including optimal function and elimination of symptoms with minimal need for rescue therapy.  Contingencies discussed in full including contacting this office immediately if not controlling the symptoms using the rule of two's.     He's doing so well could probably wean the dulera - See instructions for specific recommendations which were reviewed directly with the patient who was given a copy with highlighter outlining the key components.

## 2012-04-07 NOTE — Progress Notes (Signed)
Subjective:    Patient ID: Stuart Collins, male    DOB: 1966-09-03   MRN: [id number]  HPI 46 yowm never smoker, with a history of allergic rhinitis and asthma, followed by [facility] . He also has a history of rheumatoid arthritis followed by Dr. Kellie Simmering on methotrexate. Patient was seen for initial pulmonary consult during hospitalization. 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  12/18/2011 Post Hospital follow up  Patient returns for a post hospital followup. Patient was admitted March 16 2 12/04/2011 for acute bronchitis and kidney stones. Prior to admission. Patient had had several treatments for upper respiratory infections with antibiotics and steroids. He had no improvement in symptoms and found to be hypoxic on arrival to the emergency room. Chest x-ray showed increased interstitial markings and airspace disease. Patient was suspected to have pneumonitis secondary to methotrexate. His methotrexate was stopped. He was started on a steroid challenge. He slowly improved with decreased markings on chest x-ray. His auscultation was also complicated by kidney stones. He was treated with antibiotics, and aggressive pulmonary hygiene. He also had a urinary tract infection and was discharged on Cipro and Augmentin. Unfortunately, patient was readmitted March 29 of March 31 for a right ureter stone. He underwent a stone removal, stent placed and tolerated procedure well. Chest x-ray done on March 29 showed decreased interstitial markings.  Since discharge. Patient is feeling better but still weak. Cough and dsypnea as decreased.  He was discharged on steroid taper which he is now finished- 1 week ago. No flare in cough off steroids.  rec Continue on current regimen.  follow up in 3-4 weeks with Dr. Sherene Sires  With chest xray    01/08/2012 f/u ov/Stuart Collins back on mtx x 2 weeks and sob x 1 week with some itching sneezing running nose present at rest, some dry cough and pnds when lie down  but doesn't bother sleep.  Arthritis moderately active but trending toward better.   No subject wheeze or perceived need for daytime saba which he no longer uses on maint adair. rec Stop advair Start dulera 100 Take 2 puffs first thing in am and then another 2 puffs about 12 hours later.  Prednisone 10 mg take  4 each am x 2 days,   2 each am x 2 days,  1 each am x2days and stop    02/05/2012 f/u ov/Stuart Collins cc much better but hasn't tried aerobics no need saba daytime, no cough rec pft's / cxr    04/07/2012 f/u ov/Stuart Collins still on methotrexate cc improved ex tol to his satisfaction.  No unusual cough, purulent sputum or sinus/hb symptoms on present rx.     Sleeping ok without nocturnal  or early am exacerbation  of respiratory  c/o's or need for noct saba. Also denies any obvious fluctuation of symptoms with weather or environmental changes or other aggravating or alleviating factors except as outlined above   ROS  At present neg for  any significant sore throat, dysphagia, dental problems, itching, sneezing,  nasal congestion or excess/ purulent secretions, ear ache,   fever, chills, sweats, unintended wt loss, pleuritic or exertional cp, hemoptysis, palpitations, orthopnea pnd or leg swelling.  Also denies presyncope, palpitations, heartburn, abdominal pain, anorexia, nausea, vomiting, diarrhea  or change in bowel or urinary habits, change in stools or urine, dysuria,hematuria,  rash,  visual complaints, headache, numbness weakness or ataxia or problems with walking or coordination. No noted change in mood/affect or memory.  Objective:   Physical Exam Wt Readings from Last 3 Encounters:  04/07/12 177 lb (80.287 kg)  02/05/12 177 lb 6.4 oz (80.468 kg)  01/08/12 173 lb (78.472 kg)    GEN: A/Ox3; pleasant , NAD, well nourished   HEENT:  [place]/AT,  EACs-clear, TMs-wnl, NOSE-clear drainage  THROAT-clear, no lesions, no postnasal drip or exudate noted.   NECK:   Supple w/ fair ROM; no JVD; normal carotid impulses w/o bruits; no thyromegaly or nodules palpated; no lymphadenopathy.  RESP  Coarse BS  w/o, wheezes/ rales/ or rhonchi.no accessory muscle use, no dullness to percussion  CARD:  RRR, no m/r/g  , no peripheral edema, pulses intact, no cyanosis or clubbing.  GI:   Soft & nt; nml bowel sounds; no organomegaly or masses detected.  Musco: Warm bil, no deformities or joint swelling noted.    CXR  04/07/2012 :  No acute or active cardiopulmonary or pleural abnormalities are evident. No residual infiltrative densities are evident.          Assessment & Plan:

## 2012-04-07 NOTE — Progress Notes (Signed)
Quick Note:  Spoke with pt and notified of results per Dr. Wert. Pt verbalized understanding and denied any questions.  ______ 

## 2012-04-07 NOTE — Patient Instructions (Signed)
Your lung function is normal  Ok to change dulera to 1-2 puffs every 12 hours if not doing 100%  Return yearly for cxr and lung function tests - call sooner if not doing great on dulera 2 every 12

## 2012-07-11 ENCOUNTER — Ambulatory Visit (INDEPENDENT_AMBULATORY_CARE_PROVIDER_SITE_OTHER)
Admission: RE | Admit: 2012-07-11 | Discharge: 2012-07-11 | Disposition: A | Discharge status: Home / Self Care | Payer: 59 | Source: Ambulatory Visit | Attending: Internal Medicine | Admitting: Internal Medicine

## 2012-07-11 ENCOUNTER — Ambulatory Visit (INDEPENDENT_AMBULATORY_CARE_PROVIDER_SITE_OTHER): Payer: 59 | Admitting: Internal Medicine

## 2012-07-11 ENCOUNTER — Encounter: Payer: Self-pay | Admitting: Internal Medicine

## 2012-07-11 ENCOUNTER — Telehealth: Payer: Self-pay | Admitting: Internal Medicine

## 2012-07-11 VITALS — BP 104/70 | HR 72 | Temp 98.2°F | Ht 70.0 in | Wt 177.0 lb

## 2012-07-11 DIAGNOSIS — R0789 Other chest pain: Secondary | ICD-10-CM

## 2012-07-11 DIAGNOSIS — J45909 Unspecified asthma, uncomplicated: Secondary | ICD-10-CM

## 2012-07-11 MED ORDER — PREDNISONE (PAK) 10 MG PO TABS: ORAL_TABLET | ORAL | Status: DC

## 2012-07-11 NOTE — Assessment & Plan Note (Addendum)
-   HFA 90% 07/11/2012    - PFT's 04/07/2012 FEV1  3.31 (91%) ratio 82 and DLCO 86% - mild truncation of insp loop   - EKG wnl 07/11/2012   Not clear this is classic asthma with ddx occult mtx toxicity as before or rheumatoid bronchiolitis or anxiety.  For now  The proper method of use, as well as anticipated side effects, of a metered-dose inhaler are discussed and demonstrated to the patient. Improved effectiveness after extensive coaching during this visit to a level of approximately  90% so try dulera 200 2bid and pred x 6 days then regroup in 2 weeks

## 2012-07-11 NOTE — Telephone Encounter (Signed)
Called spoke with patient who c/o increased SOB, tightness on the left side x5 days, reported became lightheaded while in the grocery store yesterday.  Rare cough with no mucus production.  Began using HFA 3 days ago, but helps little.  Requesting ov d/t hx of resp failure.  OV with MW this morning at 0930 - pt assured that he can make this appt on time.  Will sign off.

## 2012-07-11 NOTE — Patient Instructions (Addendum)
Prednisone 10 mg take  4 each am x 2 days,   2 each am x 2 days,  1 each am x2days and stop and hold methotrexate   dulera 200 Take 2 puffs first thing in am and then another 2 puffs about 12 hours later.   Continue protonix 40 mg Take 30- 60 min before your first and last meals of the day   GERD (REFLUX)  is an extremely common cause of respiratory symptoms, many times with no significant heartburn at all.    It can be treated with medication, but also with lifestyle changes including avoidance of late meals, excessive alcohol, smoking cessation, and avoid fatty foods, chocolate, peppermint, colas, red wine, and acidic juices such as orange juice.  NO MINT OR MENTHOL PRODUCTS SO NO COUGH DROPS  USE SUGARLESS CANDY INSTEAD (jolley ranchers or [employer]over's)  NO OIL BASED VITAMINS - use powdered substitutes.   Please schedule a follow up office visit in 2 weeks, sooner if needed

## 2012-07-11 NOTE — Progress Notes (Signed)
Subjective:    Patient ID: Stuart Collins, male    DOB: 01-20-1966   MRN: [id number]  HPI 28 yowm never smoker, with a history of allergic rhinitis and asthma, followed by Stuart Collins on shots since around early 2000s.  He also has a history of rheumatoid arthritis followed by Stuart Collins on methotrexate.  Patient was seen for initial pulmonary consult during hospitalization. 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  12/18/2011 Post Hospital follow up  Patient returns for a post hospital followup. Patient was admitted March 16 2 12/04/2011 for acute bronchitis and kidney stones. Prior to admission. Patient had had several treatments for upper respiratory infections with antibiotics and steroids. He had no improvement in symptoms and found to be hypoxic on arrival to the emergency room. Chest x-ray showed increased interstitial markings and airspace disease. Patient was suspected to have pneumonitis secondary to methotrexate. His methotrexate was stopped. He was started on a steroid challenge. He slowly improved with decreased markings on chest x-ray. His auscultation was also complicated by kidney stones. He was treated with antibiotics, and aggressive pulmonary hygiene. He also had a urinary tract infection and was discharged on Cipro and Augmentin. Unfortunately, patient was readmitted March 29 of March 31 for a right ureter stone. He underwent a stone removal, stent placed and tolerated procedure well. Chest x-ray done on March 29 showed decreased interstitial markings.  Since discharge. Patient is feeling better but still weak. Cough and dsypnea as decreased.  He was discharged on steroid taper which he is now finished- 1 week ago. No flare in cough off steroids.  rec Continue on current regimen.  follow up in 3-4 weeks with Stuart Collins  With chest xray    01/08/2012 f/u ov/Stuart Collins back on mtx x 2 weeks and sob x 1 week with some itching sneezing running nose present at rest,  some dry cough and pnds when lie down but doesn't bother sleep.  Arthritis moderately active but trending toward better.   No subject wheeze or perceived need for daytime saba which he no longer uses on maint adair. rec Stop advair Start dulera 100 Take 2 puffs first thing in am and then another 2 puffs about 12 hours later.  Prednisone 10 mg take  4 each am x 2 days,   2 each am x 2 days,  1 each am x2days and stop    02/05/2012 f/u ov/Stuart Collins cc much better but hasn't tried aerobics no need saba daytime, no cough rec pft's / cxr    04/07/2012 f/u ov/Stuart Collins still on methotrexate cc improved ex tol to his satisfaction.  No unusual cough, purulent sputum or sinus/hb symptoms on present rx. rec pfts nl so ok to change dulera to prn    07/11/2012 f/u ov/Stuart Collins cc worse sob abruptly since 10/19 assoc with dry cough, started dulera 100 on 10/20 and no better and started noticing tightness on L anterior and not really better on dulera or ventolin some worse with steps but also present at rest. Somewhat hoarse since started dulera.   Sleeping ok without nocturnal  or early am exacerbation  of respiratory  c/o's or need for noct saba. Also denies any obvious fluctuation of symptoms with weather or environmental changes or other aggravating or alleviating factors except as outlined above   ROS  The following are not active complaints unless bolded sore throat, dysphagia, dental problems, itching, sneezing,  nasal congestion or excess/ purulent secretions, ear ache,   fever, chills, sweats, unintended  wt loss, pleuritic or exertional cp, hemoptysis,  orthopnea pnd or leg swelling, presyncope, palpitations, heartburn, abdominal pain, anorexia, nausea, vomiting, diarrhea  or change in bowel or urinary habits, change in stools or urine, dysuria,hematuria,  rash, arthralgias though better than baseline, visual complaints, headache, numbness weakness or ataxia or problems with walking or coordination,  change in  mood/affect or memory.                       Objective:   Physical Exam Wt 177 07/11/2012  Wt Readings from Last 3 Encounters:  04/07/12 177 lb (80.287 kg)  02/05/12 177 lb 6.4 oz (80.468 kg)  01/08/12 173 lb (78.472 kg)    GEN: A/Ox3; pleasant , NAD, well nourished   HEENT:  [place]/AT,  EACs-clear, TMs-wnl, NOSE-clear drainage  THROAT-clear, no lesions, no postnasal drip or exudate noted.   NECK:  Supple w/ fair ROM; no JVD; normal carotid impulses w/o bruits; no thyromegaly or nodules palpated; no lymphadenopathy.  RESP slt distant  BS  Trace bilateral end exp wheezes - no rales. no accessory muscle use, no dullness to percussion  CARD:  RRR, no m/r/g  , no peripheral edema, pulses intact, no cyanosis or clubbing.  GI:   Soft & nt; nml bowel sounds; no organomegaly or masses detected.  Musco: Warm bil, no deformities or joint swelling noted.    CXR  07/11/2012 :  wnl           Assessment & Plan:

## 2012-07-25 ENCOUNTER — Ambulatory Visit: Payer: 59 | Admitting: Internal Medicine

## 2013-01-14 ENCOUNTER — Other Ambulatory Visit: Payer: Self-pay | Admitting: Gastroenterology

## 2013-01-14 DIAGNOSIS — R7989 Other specified abnormal findings of blood chemistry: Secondary | ICD-10-CM

## 2013-01-19 ENCOUNTER — Other Ambulatory Visit: Payer: 59

## 2013-01-21 ENCOUNTER — Ambulatory Visit
Admission: RE | Admit: 2013-01-21 | Discharge: 2013-01-21 | Disposition: A | Discharge status: Home / Self Care | Payer: 59 | Source: Ambulatory Visit | Attending: Gastroenterology | Admitting: Gastroenterology

## 2013-01-21 DIAGNOSIS — R7989 Other specified abnormal findings of blood chemistry: Secondary | ICD-10-CM

## 2013-02-06 ENCOUNTER — Telehealth: Payer: Self-pay | Admitting: Internal Medicine

## 2013-02-06 NOTE — Telephone Encounter (Signed)
Left message for patient to call me to schedule his appt

## 2013-02-27 ENCOUNTER — Encounter: Payer: Self-pay | Admitting: Internal Medicine

## 2013-03-03 ENCOUNTER — Ambulatory Visit (INDEPENDENT_AMBULATORY_CARE_PROVIDER_SITE_OTHER): Payer: 59 | Admitting: Internal Medicine

## 2013-03-03 ENCOUNTER — Encounter: Payer: Self-pay | Admitting: Internal Medicine

## 2013-03-03 VITALS — BP 122/86 | HR 70 | Ht 70.0 in | Wt 175.0 lb

## 2013-03-03 DIAGNOSIS — Z8249 Family history of ischemic heart disease and other diseases of the circulatory system: Secondary | ICD-10-CM

## 2013-03-03 DIAGNOSIS — E785 Hyperlipidemia, unspecified: Secondary | ICD-10-CM

## 2013-03-03 DIAGNOSIS — M069 Rheumatoid arthritis, unspecified: Secondary | ICD-10-CM | POA: Insufficient documentation

## 2013-03-03 NOTE — Progress Notes (Signed)
OFFICE NOTE  Chief Complaint:  Routine follow-up  Primary Care Physician: Ralene Ok, MD  HPI:  Stuart Collins is a 47 year old gentleman with a history of rheumatoid arthritis, on methotrexate and Orencia. He also has dyslipidemia, for which we are following. From a cholesterol standpoint, he follows a very low-cholesterol diet; however, does have a history of steatohepatitis and is not currently on a statin, and is taking Zetia and fenofibrate for management of this. From what I could tell, his last lipid profile was in April of 2012 and would bear repeating at this time. Otherwise, he is asymptomatic. When following his lipid profile, we saw that he did have a lower LDL cholesterol when he was exercising more. Recently spent taking care of his mother who had spinal surgery and his fallen off of his exercise routine. He wishes to get back to that, to modify his risk factors for coronary disease.  PMHx:  Past Medical History  Diagnosis Date  . Arthritis   . Asthma   . Allergic arthritis   . Renal disorder   . History of nephrolithiasis   . Family history of premature CAD     NUCLEAR STRESS TEST, 12/30/2002 - no evidence of ischemia  . Dyslipidemia     Past Surgical History  Procedure Laterality Date  . Cystoscopy w/ ureteral stent placement  12/02/2011    Procedure: CYSTOSCOPY WITH RETROGRADE PYELOGRAM/URETERAL STENT PLACEMENT;  Surgeon: Kathi Ludwig, MD;  Location: [facility];  Service: Urology;  Laterality: Right;  . Cystoscopy w/ ureteral stent placement  12/15/2011    Procedure: CYSTOSCOPY WITH STENT REPLACEMENT;  Surgeon: Milford Cage, MD;  Location: [facility];  Service: Urology;  Laterality: Right;  right reter stent removal and stent placement  . Cystoscopy/retrograde/ureteroscopy  12/15/2011    Procedure: CYSTOSCOPY/RETROGRADE/URETEROSCOPY;  Surgeon: Milford Cage, MD;  Location: [facility];  Service: Urology;  Laterality: Right;  no retrograde  . Liver biopsy   04/2002  . Upper gi endoscopy  03/2002  . Tonsillectomy  1970    FAMHx:  Family History  Problem Relation Age of Onset  . Kidney Stones Mother   . Arthritis Mother 40    Osteo  . Heart disease Father 52  . Heart attack Maternal Grandfather 60  . Hyperlipidemia Brother     SOCHx:   reports that he quit smoking about 34 years ago. His smoking use included Cigarettes. He has a .4 pack-year smoking history. He has never used smokeless tobacco. He reports that  drinks alcohol. He reports that he does not use illicit drugs.  ALLERGIES:  Allergies  Allergen Reactions  . Mushroom Extract Complex Shortness Of Breath  . Peanut-Containing Drug Products Shortness Of Breath    All nuts  . Morphine And Related     REACTION: "makes my head feel like its stuffed; like it's going to pop"  . Shellfish Allergy     ROS: A comprehensive review of systems was negative except for: Allergic/Immunologic: positive for Rheumatoid arthritis, joint stiffness  HOME MEDS: Current Outpatient Prescriptions  Medication Sig Dispense Refill  . Abatacept (ORENCIA) 125 MG/ML SOLN Inject 125 mg into the skin once a week. On sundays      . ALPRAZolam (XANAX) 1 MG tablet Take 1 tablet by mouth as needed.      Marland Kitchen aspirin EC 81 MG tablet Take 81 mg by mouth daily.      Marland Kitchen azelastine (ASTELIN) 137 MCG/SPRAY nasal spray Place 1 spray into the nose 2 (two)  times daily. Use in each nostril as directed      . cetirizine (ZYRTEC) 10 MG tablet Take 10 mg by mouth daily.      Marland Kitchen etodolac (LODINE) 400 MG tablet Take 400 mg by mouth 3 (three) times daily.      Marland Kitchen ezetimibe (ZETIA) 10 MG tablet Take 10 mg by mouth daily.      . fenofibrate 160 MG tablet Take 160 mg by mouth daily.      . fluticasone (FLONASE) 50 MCG/ACT nasal spray Place 2 sprays into the nose daily.      . folic acid (FOLVITE) 1 MG tablet Take 1 mg by mouth daily.      . hydrochlorothiazide (HYDRODIURIL) 25 MG tablet Take 25 mg by mouth daily.      Marland Kitchen loratadine  (CLARITIN) 10 MG tablet Take 10 mg by mouth as needed for allergies.      . Multiple Vitamin (MULITIVITAMIN WITH MINERALS) TABS Take 1 tablet by mouth daily.      . pantoprazole (PROTONIX) 40 MG tablet Take 40 mg by mouth daily.      . Potassium Citrate 15 MEQ (1620 MG) TBCR Take 15 mEq by mouth daily.      . ramelteon (ROZEREM) 8 MG tablet Take 8 mg by mouth daily as needed. For sleep      . UNABLE TO FIND Med Name: allergy shot once weekly       No current facility-administered medications for this visit.    LABS/IMAGING: No results found for this or any previous visit (from the past 48 hour(s)). No results found.  VITALS: BP 122/86  Pulse 70  Ht 5\' 10"  (1.778 m)  Wt 175 lb (79.379 kg)  BMI 25.11 kg/m2  EXAM: General appearance: alert and no distress Neck: no adenopathy, no carotid bruit, no JVD, supple, symmetrical, trachea midline and thyroid not enlarged, symmetric, no tenderness/mass/nodules Lungs: clear to auscultation bilaterally Heart: regular rate and rhythm, S1, S2 normal, no murmur, click, rub or gallop Abdomen: soft, non-tender; bowel sounds normal; no masses,  no organomegaly Extremities: extremities normal, atraumatic, no cyanosis or edema Pulses: 2+ and symmetric Skin: Skin color, texture, turgor normal. No rashes or lesions Neurologic: Grossly normal  EKG: Sinus rhythm at 70  ASSESSMENT: 1. Rheumatoid arthritis 2. Dyslipidemia 3. Elevated liver function tests  PLAN: 1.   Stuart Collins is doing well from a cardiovascular standpoint, from the best I can tell. His cholesterol is fairly well managed, however he seems to be dependent on diet and exercise. He is taking fenofibrate and is ready, but has not been a candidate for statins due to elevated liver enzymes. He was recently taken off methotrexate especially for this. He continues on Orencia. At this point, given the fact that he is no known cardiovascular disease, but his increased risk due to rheumatoid  arthritis, I would recommend continued aggressive cholesterol control with diet exercise and his current medications. Unfortunately, uses a statin seems to be risk prohibitive at this time.  We will see him back annually or sooner as necessary.  Chrystie Nose, MD, [facility] Attending Cardiologist [facility]  Karliah Kowalchuk C 03/03/2013, 1:36 PM

## 2013-03-03 NOTE — Patient Instructions (Signed)
Your physician recommends that you schedule a follow-up appointment in: 1 year  

## 2013-03-06 ENCOUNTER — Telehealth: Payer: Self-pay | Admitting: Internal Medicine

## 2013-03-06 NOTE — Telephone Encounter (Signed)
°  Called patient and left messages x3. Sent letter 03/06/13 °

## 2013-04-15 ENCOUNTER — Ambulatory Visit (INDEPENDENT_AMBULATORY_CARE_PROVIDER_SITE_OTHER)
Admission: RE | Admit: 2013-04-15 | Discharge: 2013-04-15 | Disposition: A | Discharge status: Home / Self Care | Payer: 59 | Source: Ambulatory Visit | Attending: Internal Medicine | Admitting: Internal Medicine

## 2013-04-15 ENCOUNTER — Ambulatory Visit: Payer: 59 | Admitting: Internal Medicine

## 2013-04-15 ENCOUNTER — Encounter: Payer: Self-pay | Admitting: Internal Medicine

## 2013-04-15 ENCOUNTER — Ambulatory Visit (INDEPENDENT_AMBULATORY_CARE_PROVIDER_SITE_OTHER): Payer: 59 | Admitting: Internal Medicine

## 2013-04-15 VITALS — BP 106/70 | HR 62 | Temp 97.9°F | Ht 70.0 in | Wt 171.0 lb

## 2013-04-15 DIAGNOSIS — J45909 Unspecified asthma, uncomplicated: Secondary | ICD-10-CM

## 2013-04-15 DIAGNOSIS — J189 Pneumonia, unspecified organism: Secondary | ICD-10-CM

## 2013-04-15 DIAGNOSIS — J4520 Mild intermittent asthma, uncomplicated: Secondary | ICD-10-CM

## 2013-04-15 LAB — PULMONARY FUNCTION TEST

## 2013-04-15 NOTE — Assessment & Plan Note (Signed)
-   HFA 90% 07/11/2012   - PFT's 04/07/2012 FEV1  3.31 (91%) ratio 82 and DLCO 86% - mild truncation of insp loop - PFT's 04/15/2013 wnl   Excellent control on allergy shots c no need for saba > ok to use dulera" prn" flare at this point but defer all f/u to Dr Irena Cords

## 2013-04-15 NOTE — Progress Notes (Signed)
PFT done today. 

## 2013-04-15 NOTE — Progress Notes (Signed)
Subjective:   Patient ID: Stuart Collins, male    DOB: 1966/04/28   MRN: [id number]    Brief patient profile:  3 yowm never smoker, with a history of allergic rhinitis and asthma, followed by [facility] on shots since around early 2000s.  He also has a history of rheumatoid arthritis followed by Dr. Kellie Simmering on methotrexate.  Patient was seen for initial pulmonary consult during hospitalization. 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  12/18/2011 Post Hospital follow up  Patient returns for a post hospital followup. Patient was admitted March 16 2 12/04/2011 for acute bronchitis and kidney stones. Prior to admission. Patient had had several treatments for upper respiratory infections with antibiotics and steroids. He had no improvement in symptoms and found to be hypoxic on arrival to the emergency room. Chest x-ray showed increased interstitial markings and airspace disease. Patient was suspected to have pneumonitis secondary to methotrexate. His methotrexate was stopped. He was started on a steroid challenge. He slowly improved with decreased markings on chest x-ray. His auscultation was also complicated by kidney stones. He was treated with antibiotics, and aggressive pulmonary hygiene. He also had a urinary tract infection and was discharged on Cipro and Augmentin. Unfortunately, patient was readmitted March 29 of March 31 for a right ureter stone. He underwent a stone removal, stent placed and tolerated procedure well. Chest x-ray done on March 29 showed decreased interstitial markings.  Since discharge. Patient is feeling better but still weak. Cough and dsypnea as decreased.  He was discharged on steroid taper which he is now finished- 1 week ago. No flare in cough off steroids.  rec Continue on current regimen.  follow up in 3-4 weeks with Dr. Sherene Sires  With chest xray    01/08/2012 f/u ov/Wert back on mtx x 2 weeks and sob x 1 week with some itching sneezing running  nose present at rest, some dry cough and pnds when lie down but doesn't bother sleep.  Arthritis moderately active but trending toward better.   No subject wheeze or perceived need for daytime saba which he no longer uses on maint adair. rec Stop advair Start dulera 100 Take 2 puffs first thing in am and then another 2 puffs about 12 hours later.  Prednisone 10 mg take  4 each am x 2 days,   2 each am x 2 days,  1 each am x2days and stop    02/05/2012 f/u ov/Wert cc much better but hasn't tried aerobics no need saba daytime, no cough rec pft's / cxr    04/07/2012 f/u ov/Wert still on methotrexate cc improved ex tol to his satisfaction.  No unusual cough, purulent sputum or sinus/hb symptoms on present rx. rec pfts nl so ok to change dulera to prn    07/11/2012 f/u ov/Wert cc worse sob abruptly since 10/19 assoc with dry cough, started dulera 100 on 10/20 and no better and started noticing tightness on L anterior and not really better on dulera or ventolin some worse with steps but also present at rest. Somewhat hoarse since started dulera.  rec Prednisone 10 mg take  4 each am x 2 days,   2 each am x 2 days,  1 each am x2days and stop and hold methotrexate  dulera 200 Take 2 puffs first thing in am and then another 2 puffs about 12 hours later.  Continue protonix 40 mg Take 30- 60 min before your first and last meals of the day  GERD diet  04/15/2013 f/u ov/Wert re RA ? mtx lung/asthma Chief Complaint  Patient presents with  . Followup with cxr and PFT    Pt states his breathing is doing well and denies any co's today.   no needing any inhalers at all.  No obvious daytime variabilty or assoc chronic cough or cp or chest tightness, subjective wheeze overt sinus or hb symptoms. No unusual exp hx or h/o childhood pna/ asthma or knowledge of premature birth.    Sleeping ok without nocturnal  or early am exacerbation  of respiratory  c/o's or need for noct saba. Also denies any obvious  fluctuation of symptoms with weather or environmental changes or other aggravating or alleviating factors except as outlined above   ROS  The following are not active complaints unless bolded sore throat, dysphagia, dental problems, itching, sneezing,  nasal congestion or excess/ purulent secretions, ear ache,   fever, chills, sweats, unintended wt loss, pleuritic or exertional cp, hemoptysis,  orthopnea pnd or leg swelling, presyncope, palpitations, heartburn, abdominal pain, anorexia, nausea, vomiting, diarrhea  or change in bowel or urinary habits, change in stools or urine, dysuria,hematuria,  rash, arthralgias better than baseline, visual complaints, headache, numbness weakness or ataxia or problems with walking or coordination,  change in mood/affect or memory.                       Objective:   Physical Exam Wt 177 07/11/2012   Vs 171 04/15/2013  :  04/07/12 177 lb (80.287 kg)  02/05/12 177 lb 6.4 oz (80.468 kg)  01/08/12 173 lb (78.472 kg)    GEN: A/Ox3; pleasant , NAD, well nourished   HEENT:  [place]/AT,  EACs-clear, TMs-wnl, NOSE-clear drainage  THROAT-clear, no lesions, no postnasal drip or exudate noted.   NECK:  Supple w/ fair ROM; no JVD; normal carotid impulses w/o bruits; no thyromegaly or nodules palpated; no lymphadenopathy.  RESP clear bilaterally to A and P   CARD:  RRR, no m/r/g  , no peripheral edema, pulses intact, no cyanosis or clubbing.  GI:   Soft & nt; nml bowel sounds; no organomegaly or masses detected.  Musco: Warm bil, no deformities or joint swelling noted.     CXR  04/15/2013 : Hyperexpanded lungs and bronchitic change without acute cardiopulmonary disease.              Assessment & Plan:

## 2013-04-15 NOTE — Assessment & Plan Note (Addendum)
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 04/07/2012 PFT's wnl x mild truncation of insp loop - 04/15/2013 pfts wnl   cxr shows no residual changes >  He is at risk of RA lung dz and opportunistic infection so should stay updated on pneumovax and influenza but no further f/u needed

## 2013-04-15 NOTE — Patient Instructions (Addendum)
Ok to just dulera up 2 puffs every 12 hours as needed and see Dr Madie Reno for refills if needed   Pulmonary follow up can be as needed at this point   Late add needs pneumovax if not done now and in 5 years or prevnar in one year

## 2013-04-17 ENCOUNTER — Telehealth: Payer: Self-pay | Admitting: *Deleted

## 2013-04-17 NOTE — Telephone Encounter (Signed)
Per MW- needs pneumovax if not done now and in 5 years or prevnar in one year - I sent message to [place] and Ludwig [facility]arks       LMTCB to let the pt know this

## 2013-04-28 ENCOUNTER — Encounter: Payer: Self-pay | Admitting: Internal Medicine

## 2013-04-28 NOTE — Telephone Encounter (Signed)
LMTCB

## 2014-02-24 IMAGING — CT CT ABD-PELV W/O CM
1 of 2 series · 15 of 32 positions shown, 19 images · non-contrast
Comparison: None.

CLINICAL DATA: The right kidney stone, right flank pain

CT ABDOMEN AND PELVIS WITHOUT CONTRAST
TECHNIQUE: Multidetector CT imaging of the abdomen and pelvis was
performed following the standard protocol without intravenous
contrast.

[Series 2: abd/pel w/o · axial · non-contrast · 0.72mm/px · z∈[+666,+1140]mm · 15 of 105 slices shown, 19 images]
[im 5/105  soft-tissue]
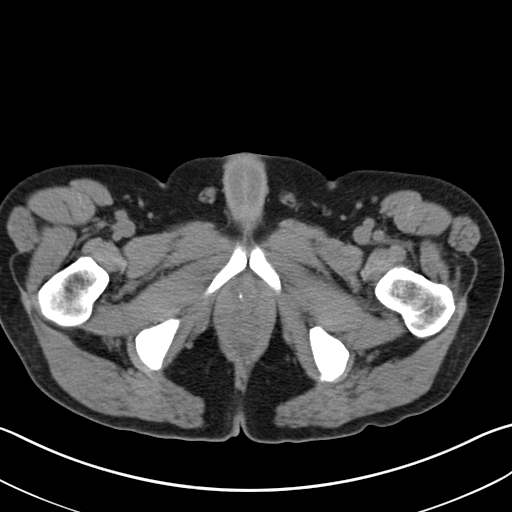
[im 5/105  bone]
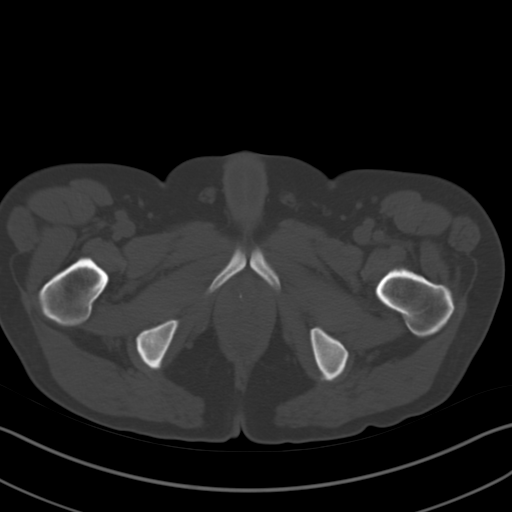
[im 14/105  soft-tissue]
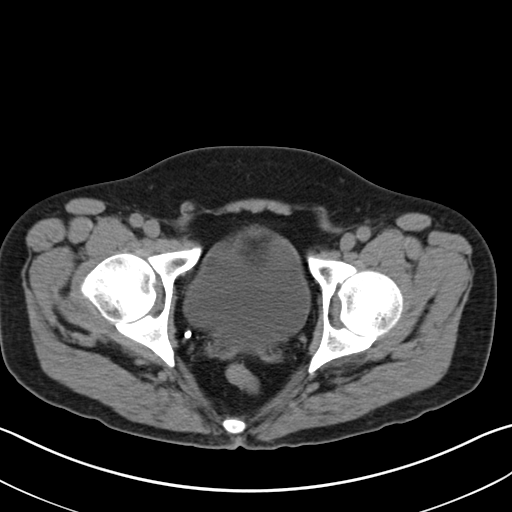
[im 22/105  soft-tissue]
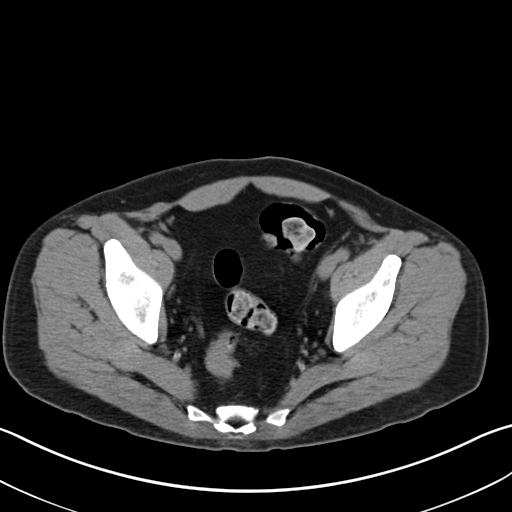
[im 31/105  soft-tissue]
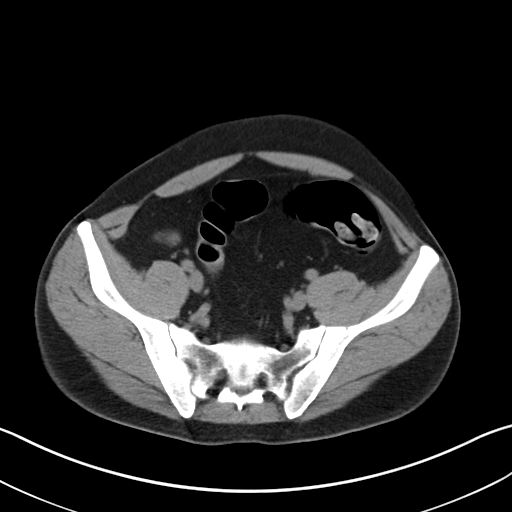
[im 35/105  soft-tissue]
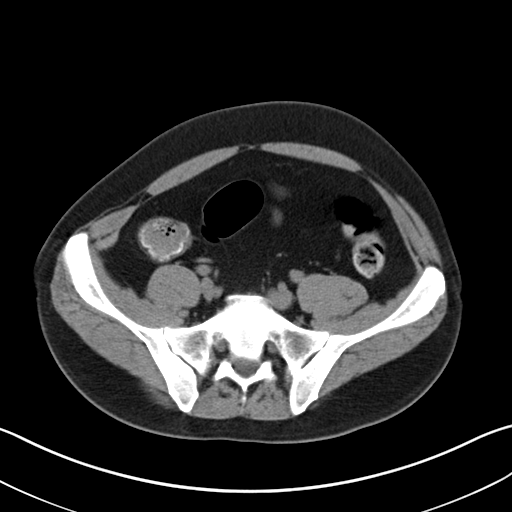
[im 44/105  soft-tissue]
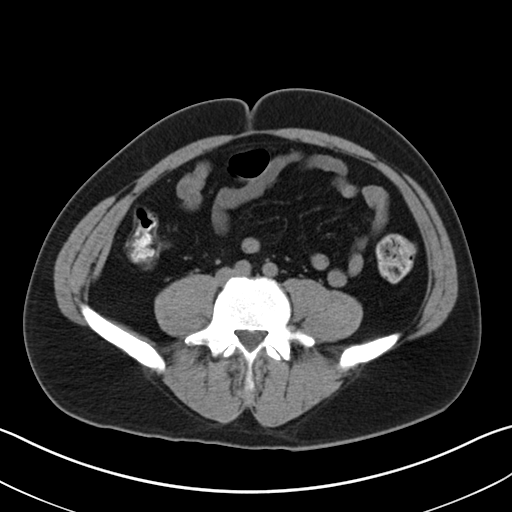
[im 53/105  soft-tissue]
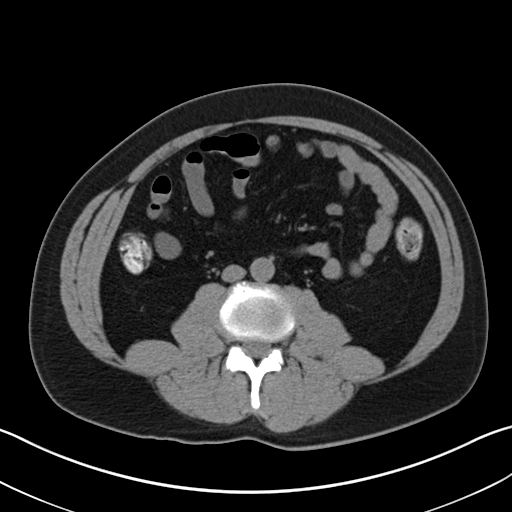
[im 61/105  soft-tissue]
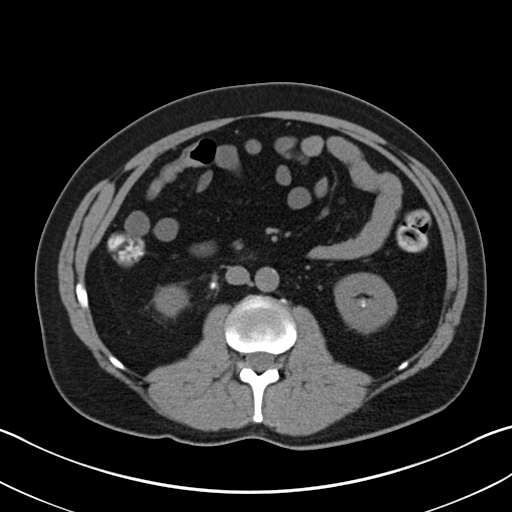
[im 70/105  soft-tissue]
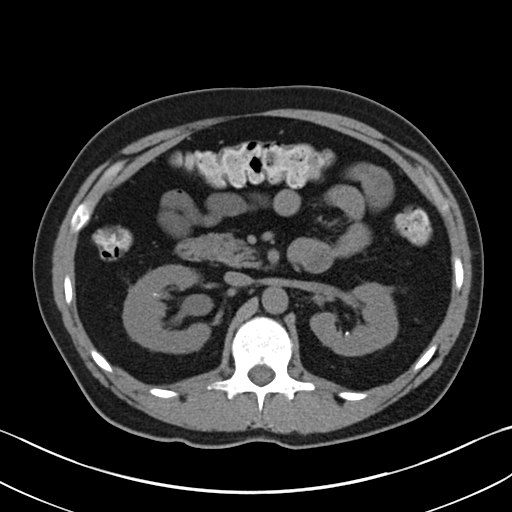
[im 70/105  bone]
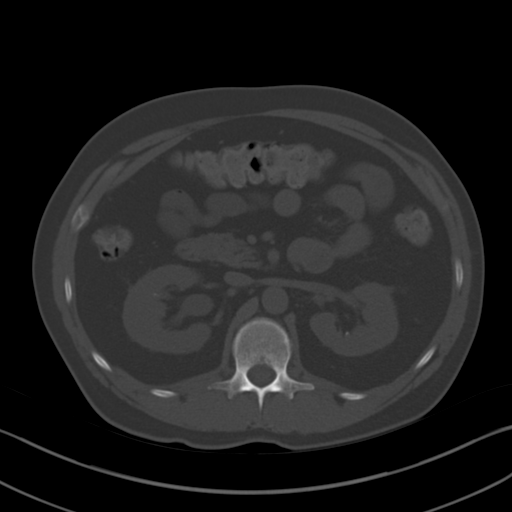
[im 74/105  soft-tissue]
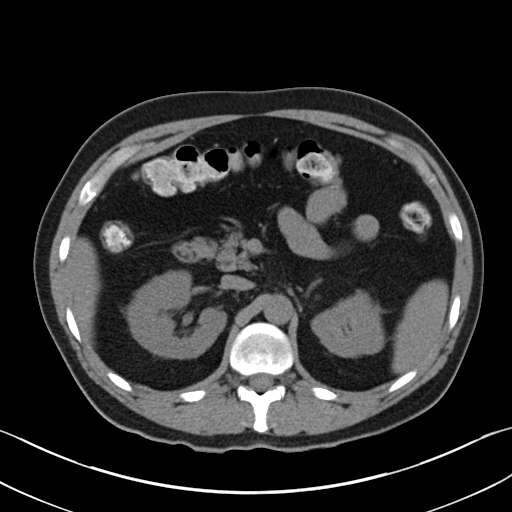
[im 83/105  soft-tissue]
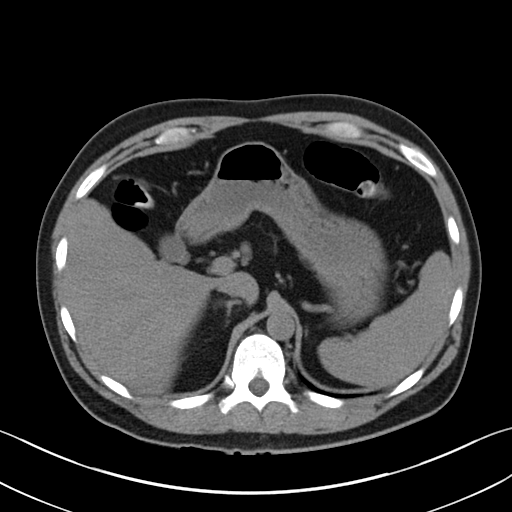
[im 87/105  lung]
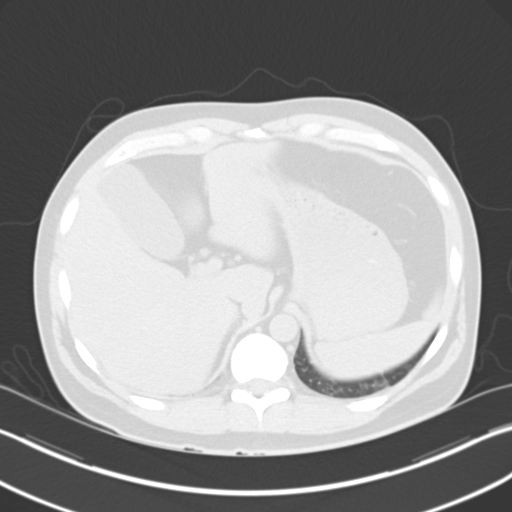
[im 92/105  soft-tissue]
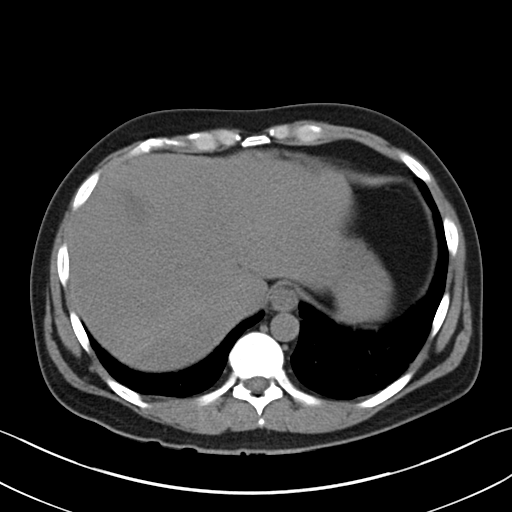
[im 92/105  lung]
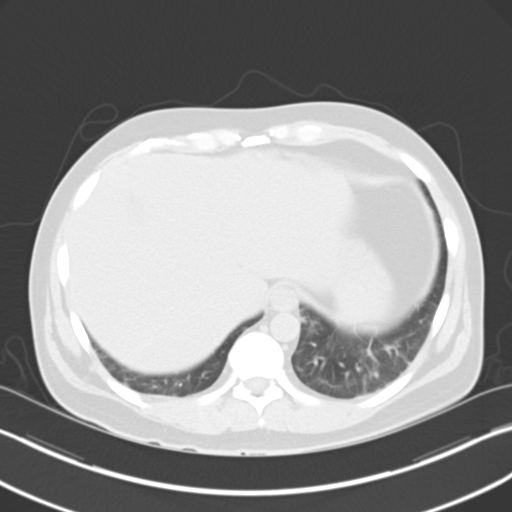
[im 96/105  lung]
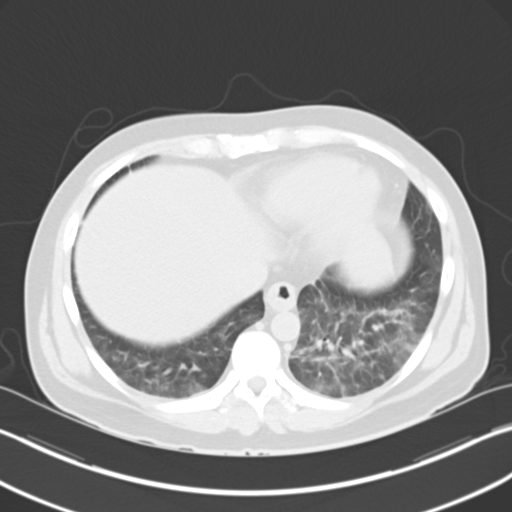
[im 100/105  soft-tissue]
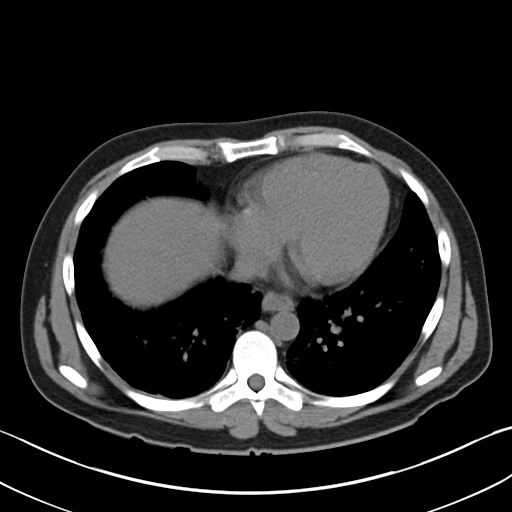
[im 100/105  lung]
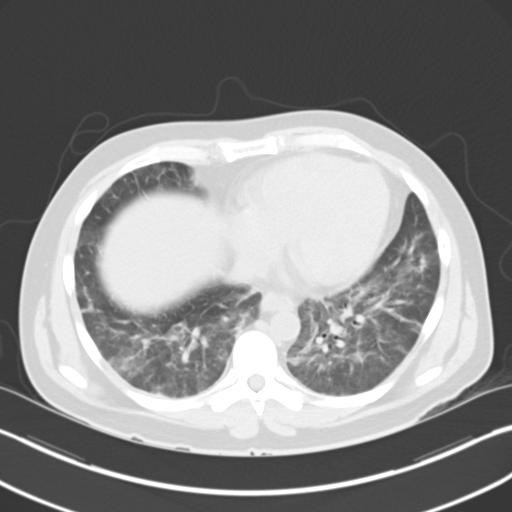

[15 of 32 positions shown; findings below may reference images not displayed]

FINDINGS: There is diffuse ground-glass opacities at the lung bases
and interlobular septal thickening.  No pericardial fluid.

Non-IV contrast images demonstrate no focal hepatic lesion.  The
gallbladder, pancreas, spleen, adrenal glands normal.

There are four nonobstructing calculi within the right kidney.
There is mild pelvicaliectasis on the right and hydroureter on the
right.  This secondary to a partially obstructing calculus at the
right ureteral pelvic junction measuring 5 mm (image 44).  There
are 6 calculi within the left kidney ranging size from 2-4 mm.  No
evidence of left ureteral lithiasis.

The stomach, small bowel, and colon are normal.  Abdominal aorta
normal caliber.  No retroperitoneal periportal lymphadenopathy.

No free fluid the pelvis.  No distal ureteral stones or bladder
stones.  The prostate gland is normal.  No pelvic lymphadenopathy.
Review of  bone windows demonstrates no aggressive osseous lesions.
IMPRESSION: 1..  Partially obstructing calculus at the right ureteropelvic
junction with mild pelvicaliectasis and hydroureter.
2.  Bilateral nephrolithiasis.
3.  Diffuse ground-glass opacity lung bases representing pulmonary
edema or diffuse infection or hemorrhage.

## 2014-03-08 ENCOUNTER — Ambulatory Visit: Payer: 59 | Admitting: Internal Medicine

## 2014-03-15 ENCOUNTER — Ambulatory Visit (INDEPENDENT_AMBULATORY_CARE_PROVIDER_SITE_OTHER): Payer: 59 | Admitting: Internal Medicine

## 2014-03-15 ENCOUNTER — Encounter: Payer: Self-pay | Admitting: Internal Medicine

## 2014-03-15 VITALS — BP 130/74 | HR 62 | Ht 70.0 in | Wt 178.0 lb

## 2014-03-15 DIAGNOSIS — E785 Hyperlipidemia, unspecified: Secondary | ICD-10-CM

## 2014-03-15 DIAGNOSIS — M069 Rheumatoid arthritis, unspecified: Secondary | ICD-10-CM

## 2014-03-15 MED ORDER — EZETIMIBE 10 MG PO TABS: 10.0000 mg | ORAL_TABLET | Freq: Every day | ORAL | Status: DC

## 2014-03-15 NOTE — Progress Notes (Signed)
OFFICE NOTE  Chief Complaint:  Routine follow-up  Primary Care Physician: Jilda Panda, MD  HPI:  Stuart Collins is a 48 year old gentleman with a history of rheumatoid arthritis, on methotrexate and Orencia. He also has dyslipidemia, for which we are following. From a cholesterol standpoint, he follows a very low-cholesterol diet; however, does have a history of steatohepatitis and is not currently on a statin, and is taking Zetia and fenofibrate for management of this. From what I could tell, his last lipid profile was in [place] of 2012 and would bear repeating at this time. Otherwise, he is asymptomatic. When following his lipid profile, we saw that he did have a lower LDL cholesterol when he was exercising more. Recently spent taking care of his mother who had spinal surgery and his fallen off of his exercise routine. He wishes to get back to that, to modify his risk factors for coronary disease.  [name] returns for followup today and is doing well. His cholesterol maintains good control with an LDL of 95. He is not on a statin due to a history of steatohepatitis.  He is curious as to whether or not he has any coronary artery disease at this point or not.  PMHx:  Past Medical History  Diagnosis Date  . Arthritis   . Asthma   . Allergic arthritis   . Renal disorder   . History of nephrolithiasis   . Family history of premature CAD     NUCLEAR STRESS TEST, 12/30/2002 - no evidence of ischemia  . Dyslipidemia     Past Surgical History  Procedure Laterality Date  . Cystoscopy w/ ureteral stent placement  12/02/2011    Procedure: CYSTOSCOPY WITH RETROGRADE PYELOGRAM/URETERAL STENT PLACEMENT;  Surgeon: Ailene Rud, MD;  Location: [facility];  Service: Urology;  Laterality: Right;  . Cystoscopy w/ ureteral stent placement  12/15/2011    Procedure: CYSTOSCOPY WITH STENT REPLACEMENT;  Surgeon: Molli Hazard, MD;  Location: [facility];  Service: Urology;  Laterality:  Right;  right reter stent removal and stent placement  . Cystoscopy/retrograde/ureteroscopy  12/15/2011    Procedure: CYSTOSCOPY/RETROGRADE/URETEROSCOPY;  Surgeon: Molli Hazard, MD;  Location: [facility];  Service: Urology;  Laterality: Right;  no retrograde  . Liver biopsy  04/2002  . Upper gi endoscopy  03/2002  . Tonsillectomy  1970    FAMHx:  Family History  Problem Relation Age of Onset  . Kidney Stones Mother   . Arthritis Mother 75    Osteo  . Heart disease Father 71  . Heart attack Maternal Grandfather 60  . Hyperlipidemia Brother     SOCHx:   reports that he quit smoking about 35 years ago. His smoking use included Cigarettes. He has a .4 pack-year smoking history. He has never used smokeless tobacco. He reports that he drinks alcohol. He reports that he does not use illicit drugs.  ALLERGIES:  Allergies  Allergen Reactions  . Mushroom Extract Complex Shortness Of Breath  . Peanut-Containing Drug Products Shortness Of Breath    All nuts  . Morphine And Related     REACTION: "makes my head feel like its stuffed; like it's going to pop"  . Shellfish Allergy     ROS: A comprehensive review of systems was negative except for: Allergic/Immunologic: positive for Rheumatoid arthritis, joint stiffness  HOME MEDS: Current Outpatient Prescriptions  Medication Sig Dispense Refill  . Abatacept (ORENCIA) 125 MG/ML SOLN Inject 125 mg into the skin once a week. On sundays      .  ALPRAZolam (XANAX) 1 MG tablet Take 1 tablet by mouth as needed.      Marland Kitchen aspirin EC 81 MG tablet Take 81 mg by mouth daily.      Marland Kitchen azelastine (ASTELIN) 137 MCG/SPRAY nasal spray Place 1 spray into the nose 2 (two) times daily. Use in each nostril as directed      . cetirizine (ZYRTEC) 10 MG tablet Take 10 mg by mouth daily.      Marland Kitchen ezetimibe (ZETIA) 10 MG tablet Take 1 tablet (10 mg total) by mouth daily.  35 tablet  0  . fenofibrate 160 MG tablet Take 160 mg by mouth daily.      . fluticasone  (FLONASE) 50 MCG/ACT nasal spray Place 2 sprays into the nose daily.      . folic acid (FOLVITE) 1 MG tablet Take 1 mg by mouth daily.      . montelukast (SINGULAIR) 10 MG tablet Take 10 mg by mouth daily.      . Multiple Vitamin (MULITIVITAMIN WITH MINERALS) TABS Take 1 tablet by mouth daily.      . pantoprazole (PROTONIX) 40 MG tablet Take 40 mg by mouth daily.      . Potassium Citrate 15 MEQ (1620 MG) TBCR Take 15 mEq by mouth daily.      . ramelteon (ROZEREM) 8 MG tablet Take 8 mg by mouth daily as needed. For sleep      . triamcinolone cream (KENALOG) 0.1 % as needed.      Marland Kitchen UNABLE TO FIND Med Name: allergy shot once weekly       No current facility-administered medications for this visit.    LABS/IMAGING: No results found for this or any previous visit (from the past 48 hour(s)). No results found.  VITALS: BP 130/74  Pulse 62  Ht 5\' 10"  (1.778 m)  Wt 178 lb (80.74 kg)  BMI 25.54 kg/m2  EXAM: General appearance: alert and no distress Neck: no adenopathy, no carotid bruit, no JVD, supple, symmetrical, trachea midline and thyroid not enlarged, symmetric, no tenderness/mass/nodules Lungs: clear to auscultation bilaterally Heart: regular rate and rhythm, S1, S2 normal, no murmur, click, rub or gallop Abdomen: soft, non-tender; bowel sounds normal; no masses,  no organomegaly Extremities: extremities normal, atraumatic, no cyanosis or edema Pulses: 2+ and symmetric Skin: Skin color, texture, turgor normal. No rashes or lesions Neurologic: Grossly normal  EKG: Sinus rhythm at 62  ASSESSMENT: 1. Rheumatoid arthritis 2. Dyslipidemia 3. Elevated liver function tests  PLAN: 1.   Mr. Wegener is doing well from a cardiovascular standpoint, from the best I can tell. His cholesterol is fairly well managed, however he seems to be dependent on diet and exercise. He is taking fenofibrate and is ready, but has not been a candidate for statins due to elevated liver enzymes. He was  recently taken off methotrexate especially for this. He continues on Orencia. At this point, given the fact that he is no known cardiovascular disease, but his increased risk due to rheumatoid arthritis, I would recommend continued aggressive cholesterol control with diet exercise and his current medications. I am curious as to whether or not he has any known coronary artery disease, which may necessitate the addition of a low-dose statin or other therapy to more aggressively manage his cholesterol. I believe a coronary calcium score be helpful in further risk stratify him. I will contact you with the results of his calcium score.  Pixie Casino, MD, [facility] Attending Cardiologist [facility]  HILTY,Kenneth C 03/15/2014, 10:03 AM

## 2014-03-15 NOTE — Patient Instructions (Addendum)
Dr Debara Pickett wants you to follow-up in 1 year. You will receive a reminder letter in the mail one months in advance. If you don't receive a letter, please call our office to schedule the follow-up appointment.   CORONARY CALCIUM SCORE A coronary calcium scan is an imaging test used to look for deposits of calcium and other fatty materials (plaques) in the inner lining of the blood vessels of your heart (coronary arteries). These deposits of calcium and plaques can partly clog and narrow the coronary arteries without producing any symptoms or warning signs. This puts you at risk for a heart attack. This test can detect these deposits before symptoms develop.  LET [facility] CARE PROVIDER KNOW ABOUT:  Any allergies you have.  All medicines you are taking, including vitamins, herbs, eye drops, creams, and over-the-counter medicines.  Previous problems you or members of your family have had with the use of anesthetics.  Any blood disorders you have.  Previous surgeries you have had.  Medical conditions you have.  Possibility of pregnancy, if this applies. RISKS AND COMPLICATIONS Generally, this is a safe procedure. However, as with any procedure, complications can occur. This test involves the use of radiation. Radiation exposure can be dangerous to a pregnant woman and her unborn baby. If you are pregnant, you should not have this procedure done.  BEFORE THE PROCEDURE There is no special preparation for the procedure. PROCEDURE  You will need to undress and put on a hospital gown. You will need to remove any jewelry around your neck or chest.  Sticky electrodes are placed on your chest and are connected to an electrocardiogram (EKG or electrocardiography) machine to recorda tracing of the electrical activity of your heart.  A CT scanner will take pictures of your heart. During this time, you will be asked to lie still and hold your breath for 2-3 seconds while a picture is being taken of  your heart. AFTER THE PROCEDURE   You will be allowed to get dressed.  You can return to your normal activities after the scan is done. Document Released: 03/01/2008 Document Revised: 09/08/2013 Document Reviewed: 05/11/2013 [facility] Patient Information 2015 [place], [place]. This information is not intended to replace advice given to you by your health care provider. Make sure you discuss any questions you have with your health care provider.

## 2014-03-17 DIAGNOSIS — I2510 Atherosclerotic heart disease of native coronary artery without angina pectoris: Secondary | ICD-10-CM

## 2014-03-17 HISTORY — DX: Atherosclerotic heart disease of native coronary artery without angina pectoris: I25.10

## 2014-03-23 ENCOUNTER — Other Ambulatory Visit: Payer: 59

## 2014-03-31 ENCOUNTER — Ambulatory Visit (INDEPENDENT_AMBULATORY_CARE_PROVIDER_SITE_OTHER)
Admission: RE | Admit: 2014-03-31 | Discharge: 2014-03-31 | Disposition: A | Discharge status: Home / Self Care | Payer: Self-pay | Source: Ambulatory Visit | Attending: Internal Medicine | Admitting: Internal Medicine

## 2014-03-31 DIAGNOSIS — M069 Rheumatoid arthritis, unspecified: Secondary | ICD-10-CM

## 2014-03-31 DIAGNOSIS — E785 Hyperlipidemia, unspecified: Secondary | ICD-10-CM

## 2014-04-14 ENCOUNTER — Telehealth: Payer: Self-pay | Admitting: *Deleted

## 2014-04-14 DIAGNOSIS — R931 Abnormal findings on diagnostic imaging of heart and coronary circulation: Secondary | ICD-10-CM

## 2014-04-14 DIAGNOSIS — Z79899 Other long term (current) drug therapy: Secondary | ICD-10-CM

## 2014-04-14 DIAGNOSIS — E785 Hyperlipidemia, unspecified: Secondary | ICD-10-CM

## 2014-04-14 MED ORDER — PITAVASTATIN CALCIUM 4 MG PO TABS: 2.0000 mg | ORAL_TABLET | Freq: Every day | ORAL | Status: DC

## 2014-04-14 NOTE — Telephone Encounter (Signed)
Patient notified of coronary calcium score and need for statin medication. Livalo ordered, samples provided/discount card, and repeat labs ordered (all placed in sample bag at front desk for pick up)

## 2014-04-14 NOTE — Telephone Encounter (Signed)
Message copied by Fidel Levy on Wed Apr 14, 2014 12:43 PM ------      Message from: Pixie Casino      Created: Tue Apr 13, 2014  8:38 AM      Regarding: RE: CT calc score       I was thinking of trying Livalo 2 mg daily. Could discuss at follow-up with him.             Dr. Lemmie Evens            ----- Message -----         From: Fidel Levy, RN         Sent: 04/13/2014   7:33 AM           To: Pixie Casino, MD      Subject: CT calc score                                            Got the result note you released to MyChart for this guy.. Anything he needs to be on to for cholesterol apart from zetia.Marland Kitchen "This demonstrates coronary disease and confirms that we need to aggressively treat your cholesterol."             Saw in your note he's had statin issues in past..             Thanks!       ------

## 2014-05-14 ENCOUNTER — Telehealth: Payer: Self-pay | Admitting: Internal Medicine

## 2014-05-14 MED ORDER — PITAVASTATIN CALCIUM 2 MG PO TABS: 2.0000 mg | ORAL_TABLET | Freq: Every day | ORAL | Status: DC

## 2014-05-14 NOTE — Telephone Encounter (Signed)
Need a new prescription for Livalol 2 mg #30. Please call to [phone].

## 2014-05-14 NOTE — Telephone Encounter (Signed)
Rx was sent to pharmacy electronically. 

## 2014-07-16 LAB — COMPREHENSIVE METABOLIC PANEL
ALBUMIN: 4.5 g/dL (ref 3.5–5.2)
ALT: 54 U/L — AB (ref 0–53)
AST: 42 U/L — AB (ref 0–37)
Alkaline Phosphatase: 54 U/L (ref 39–117)
BUN: 14 mg/dL (ref 6–23)
CHLORIDE: 107 meq/L (ref 96–112)
CO2: 26 mEq/L (ref 19–32)
Calcium: 9.9 mg/dL (ref 8.4–10.5)
Creat: 1.18 mg/dL (ref 0.50–1.35)
Glucose, Bld: 85 mg/dL (ref 70–99)
POTASSIUM: 4.6 meq/L (ref 3.5–5.3)
Sodium: 143 mEq/L (ref 135–145)
TOTAL PROTEIN: 6.7 g/dL (ref 6.0–8.3)
Total Bilirubin: 0.6 mg/dL (ref 0.2–1.2)

## 2014-07-19 ENCOUNTER — Encounter: Payer: Self-pay | Admitting: *Deleted

## 2014-07-19 LAB — NMR LIPOPROFILE WITH LIPIDS
CHOLESTEROL, TOTAL: 142 mg/dL (ref 100–199)
HDL Particle Number: 40.2 umol/L (ref >=30.5)
HDL Size: 8.4 nm — ABNORMAL LOW (ref >=9.2)
HDL-C: 42 mg/dL (ref >39)
LARGE VLDL-P: 4.1 nmol/L — AB (ref <=2.7)
LDL (calc): 73 mg/dL (ref 0–99)
LDL Particle Number: 1160 nmol/L — ABNORMAL HIGH (ref <1000)
LDL Size: 20.5 nm (ref >=20.8)
LP-IR Score: 76 — ABNORMAL HIGH (ref <=45)
Large HDL-P: <1.3 umol/L — ABNORMAL LOW (ref >=4.8)
Small LDL Particle Number: 606 nmol/L — ABNORMAL HIGH (ref <=527)
TRIGLYCERIDES: 135 mg/dL (ref 0–149)
VLDL SIZE: 51.3 nm — AB (ref <=46.6)

## 2014-08-18 DIAGNOSIS — C4491 Basal cell carcinoma of skin, unspecified: Secondary | ICD-10-CM

## 2014-08-18 DIAGNOSIS — Z9889 Other specified postprocedural states: Secondary | ICD-10-CM

## 2014-08-18 HISTORY — DX: Other specified postprocedural states: Z98.890

## 2014-08-18 HISTORY — DX: Basal cell carcinoma of skin, unspecified: C44.91

## 2015-02-25 ENCOUNTER — Other Ambulatory Visit: Payer: Self-pay | Admitting: Internal Medicine

## 2015-02-28 ENCOUNTER — Ambulatory Visit (INDEPENDENT_AMBULATORY_CARE_PROVIDER_SITE_OTHER): Payer: 59 | Admitting: Internal Medicine

## 2015-02-28 ENCOUNTER — Encounter: Payer: Self-pay | Admitting: Internal Medicine

## 2015-02-28 VITALS — BP 118/78 | HR 62 | Ht 70.0 in | Wt 176.0 lb

## 2015-02-28 DIAGNOSIS — M069 Rheumatoid arthritis, unspecified: Secondary | ICD-10-CM | POA: Diagnosis not present

## 2015-02-28 DIAGNOSIS — E785 Hyperlipidemia, unspecified: Secondary | ICD-10-CM | POA: Diagnosis not present

## 2015-02-28 DIAGNOSIS — R938 Abnormal findings on diagnostic imaging of other specified body structures: Secondary | ICD-10-CM

## 2015-02-28 DIAGNOSIS — R931 Abnormal findings on diagnostic imaging of heart and coronary circulation: Secondary | ICD-10-CM

## 2015-02-28 MED ORDER — PITAVASTATIN CALCIUM 2 MG PO TABS
2.0000 mg | ORAL_TABLET | Freq: Every day | ORAL | Status: DC
Start: 2015-02-28 — End: 2016-03-15

## 2015-02-28 MED ORDER — EZETIMIBE 10 MG PO TABS: 10.0000 mg | ORAL_TABLET | Freq: Every day | ORAL | Status: DC

## 2015-02-28 NOTE — Progress Notes (Signed)
OFFICE NOTE  Chief Complaint:  Routine follow-up  Primary Care Physician: Jilda Panda, MD  HPI:  Stuart Collins is a 49 year old gentleman with a history of rheumatoid arthritis, on methotrexate and Orencia. He also has dyslipidemia, for which we are following. From a cholesterol standpoint, he follows a very low-cholesterol diet; however, does have a history of steatohepatitis and is not currently on a statin, and is taking Zetia and fenofibrate for management of this. From what I could tell, his last lipid profile was in April of 2012 and would bear repeating at this time. Otherwise, he is asymptomatic. When following his lipid profile, we saw that he did have a lower LDL cholesterol when he was exercising more. Recently spent taking care of his mother who had spinal surgery and his fallen off of his exercise routine. He wishes to get back to that, to modify his risk factors for coronary disease.  Stuart Collins returns for followup today and is doing well. His cholesterol maintains good control with an LDL of 95. He is not on a statin due to a history of steatohepatitis.  He is curious as to whether or not he has any coronary artery disease at this point or not.  I saw Stuart Collins back in the office today. Overall he is doing well. He reports his arthritis is well controlled on the Orencia. He's currently on Zetia 10 mg, fenofibrate 160 and Livalo 2 mg daily. Livalo was specifically chosen because of his history of steatohepatitis. There is clear evidence that this medicine is very unlikely to cause liver enzyme abnormalities, therefore there is no generic statin alternative for this medication. He reports his exercise has been less than he would desire. Weight is fairly stable. Blood pressure is well-controlled.  PMHx:  Past Medical History  Diagnosis Date  . Arthritis   . Asthma   . Allergic arthritis   . Renal disorder   . History of nephrolithiasis   . Family history of  premature CAD     NUCLEAR STRESS TEST, 12/30/2002 - no evidence of ischemia  . Dyslipidemia     Past Surgical History  Procedure Laterality Date  . Cystoscopy w/ ureteral stent placement  12/02/2011    Procedure: CYSTOSCOPY WITH RETROGRADE PYELOGRAM/URETERAL STENT PLACEMENT;  Surgeon: Ailene Rud, MD;  Location: [facility];  Service: Urology;  Laterality: Right;  . Cystoscopy w/ ureteral stent placement  12/15/2011    Procedure: CYSTOSCOPY WITH STENT REPLACEMENT;  Surgeon: Molli Hazard, MD;  Location: [facility];  Service: Urology;  Laterality: Right;  right reter stent removal and stent placement  . Cystoscopy/retrograde/ureteroscopy  12/15/2011    Procedure: CYSTOSCOPY/RETROGRADE/URETEROSCOPY;  Surgeon: Molli Hazard, MD;  Location: [facility];  Service: Urology;  Laterality: Right;  no retrograde  . Liver biopsy  04/2002  . Upper gi endoscopy  03/2002  . Tonsillectomy  1970    FAMHx:  Family History  Problem Relation Age of Onset  . Kidney Stones Mother   . Arthritis Mother 62    Osteo  . Heart disease Father 28  . Heart attack Maternal Grandfather 60  . Hyperlipidemia Brother     SOCHx:   reports that he quit smoking about 36 years ago. His smoking use included Cigarettes. He has a .4 pack-year smoking history. He has never used smokeless tobacco. He reports that he drinks alcohol. He reports that he does not use illicit drugs.  ALLERGIES:  Allergies  Allergen Reactions  . Mushroom Extract Complex Shortness  Of Breath  . Peanut-Containing Drug Products Shortness Of Breath    All nuts  . Morphine And Related     REACTION: "makes my head feel like its stuffed; like it's going to pop"  . Shellfish Allergy     ROS: A comprehensive review of systems was negative.  HOME MEDS: Current Outpatient Prescriptions  Medication Sig Dispense Refill  . Abatacept (ORENCIA) 125 MG/ML SOLN Inject 125 mg into the skin once a week. On sundays    . ALPRAZolam (XANAX) 1 MG  tablet Take 1 tablet by mouth as needed.    Marland Kitchen aspirin EC 81 MG tablet Take 81 mg by mouth daily.    Marland Kitchen azelastine (ASTELIN) 137 MCG/SPRAY nasal spray Place 1 spray into the nose 2 (two) times daily. Use in each nostril as directed    . cetirizine (ZYRTEC) 10 MG tablet Take 10 mg by mouth daily.    Marland Kitchen ezetimibe (ZETIA) 10 MG tablet Take 1 tablet (10 mg total) by mouth daily. 35 tablet 0  . fenofibrate 160 MG tablet Take 160 mg by mouth daily.    . fluticasone (FLONASE) 50 MCG/ACT nasal spray Place 2 sprays into the nose daily.    . Multiple Vitamin (MULITIVITAMIN WITH MINERALS) TABS Take 1 tablet by mouth daily.    . Omega-3 Fatty Acids (FISH OIL) 1200 MG CAPS Take by mouth.    . pantoprazole (PROTONIX) 40 MG tablet Take 40 mg by mouth daily.    . Pitavastatin Calcium 2 MG TABS Take 1 tablet (2 mg total) by mouth daily. 30 tablet 9  . Potassium Citrate 15 MEQ (1620 MG) TBCR Take 15 mEq by mouth daily.    . ramelteon (ROZEREM) 8 MG tablet Take 8 mg by mouth daily as needed. For sleep    . triamcinolone cream (KENALOG) 0.1 % as needed.    Marland Kitchen UNABLE TO FIND Med Name: allergy shot once weekly     No current facility-administered medications for this visit.    LABS/IMAGING: No results found for this or any previous visit (from the past 48 hour(s)). No results found.  VITALS: BP 118/78 mmHg  Pulse 62  Ht 5\' 10"  (1.778 m)  Wt 176 lb (79.833 kg)  BMI 25.25 kg/m2  EXAM: General appearance: alert and no distress Neck: no adenopathy, no carotid bruit, no JVD, supple, symmetrical, trachea midline and thyroid not enlarged, symmetric, no tenderness/mass/nodules Lungs: clear to auscultation bilaterally Heart: regular rate and rhythm, S1, S2 normal, no murmur, click, rub or gallop Abdomen: soft, non-tender; bowel sounds normal; no masses,  no organomegaly Extremities: extremities normal, atraumatic, no cyanosis or edema Pulses: 2+ and symmetric Skin: Skin color, texture, turgor normal. No rashes or  lesions Neurologic: Grossly normal  EKG: Sinus rhythm at 62  ASSESSMENT: 1. Rheumatoid arthritis 2. Dyslipidemia 3. Elevated liver function tests - steatohepatitis 4. Coronary artery disease-mid LAD coronary calcium  PLAN: 1.   Mr. Sandate is doing well from a cardiovascular standpoint.  His rheumatoid arthritis is well controlled and followed by Dr. Charlestine Night. He had coronary artery calcium testing last year which showed a calcified plaque in the mid LAD and a score of 65. His total cholesterol profile is well treated with particle number just over 1100 and LDL in the 60s. He recently had blood work from his primary care provider last week and will obtain those results. He reports no side effects that he can tell from the medications. Will plan to renew both Livalo and said he  had today and provide him with co-pay assistance cards. He was informed from his insurance company that [place] was not covered. There is not a generic alternative for this medication. The risk of liver function abnormalities in patients with steatohepatitis is significantly higher with generic statin such as simvastatin or atorvastatin therefore they're not clear alternatives. This medicine is medically necessary for that purpose. In addition, he is achieving excellent cholesterol control and risk factor modification given the fact that he has known coronary artery disease.  Plan to see him back annually or sooner as necessary.  Pixie Casino, MD, [facility] Attending Cardiologist [facility] 02/28/2015, 8:28 AM

## 2015-02-28 NOTE — Patient Instructions (Signed)
Dr Debara Pickett recommends that you schedule a follow-up appointment in 1 year. You will receive a reminder letter in the mail two months in advance. If you don't receive a letter, please call our office to schedule the follow-up appointment.  Medication samples have been provided to the patient. Drug name: Livalo 2mg  Qty: 14 tabs LOT: [id number] Exp.Date: 04/2017  Donivan Scull 8:30 AM 02/28/2015

## 2015-04-17 IMAGING — US US ABDOMEN COMPLETE
1 series · 13 of 25 positions shown · non-contrast
Comparison: CT 12/01/2011 and renal ultrasound 04/08/2006

CLINICAL DATA: Chronic elevation of the liver function studies on
cholesterol medication.

COMPLETE ABDOMINAL ULTRASOUND

[Series 1: us abdomen complete · 0.31mm/px · 13 of 64 slices shown]
[im 1/64]
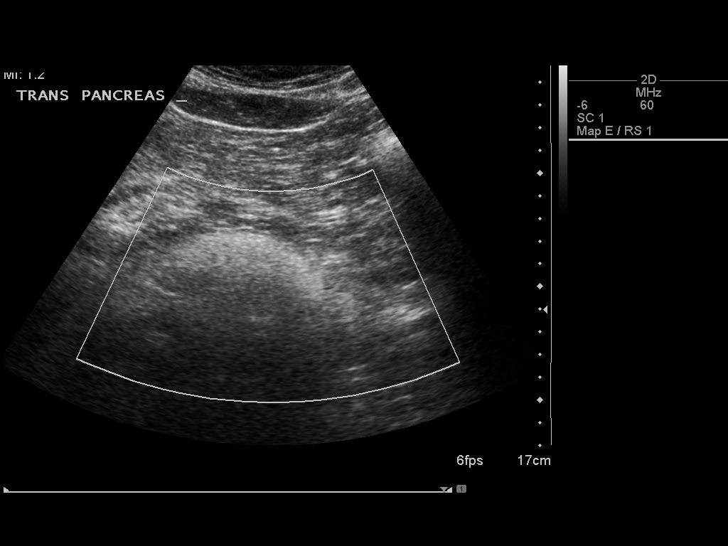
[im 6/64]
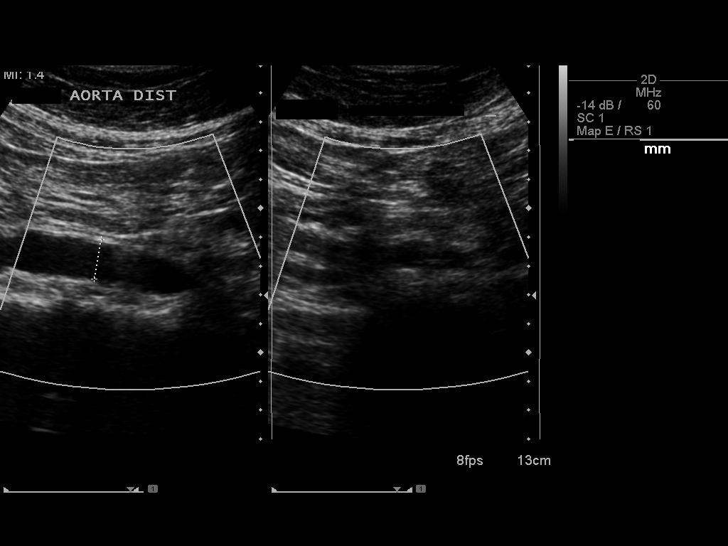
[im 11/64]
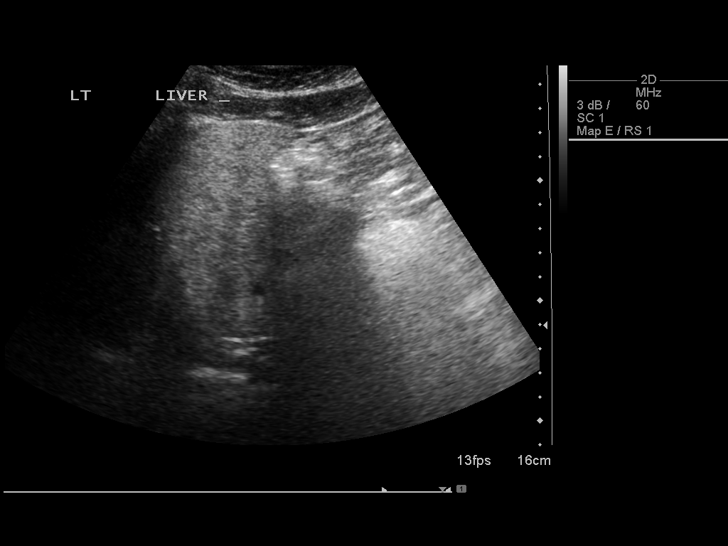
[im 16/64]
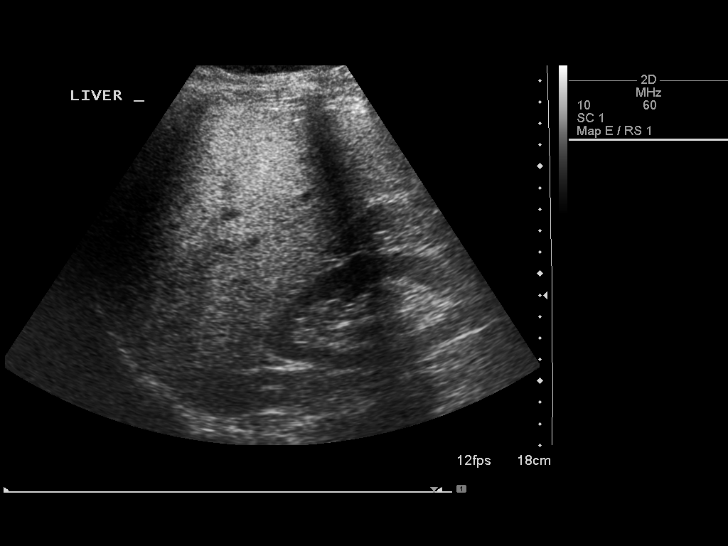
[im 22/64]
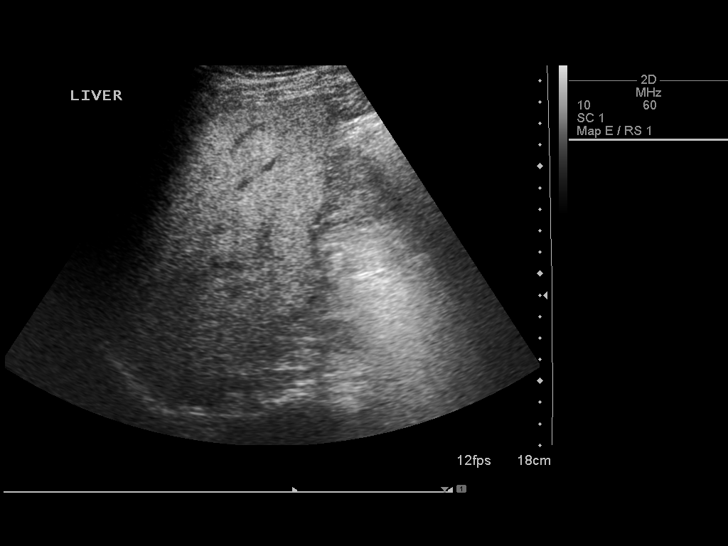
[im 27/64]
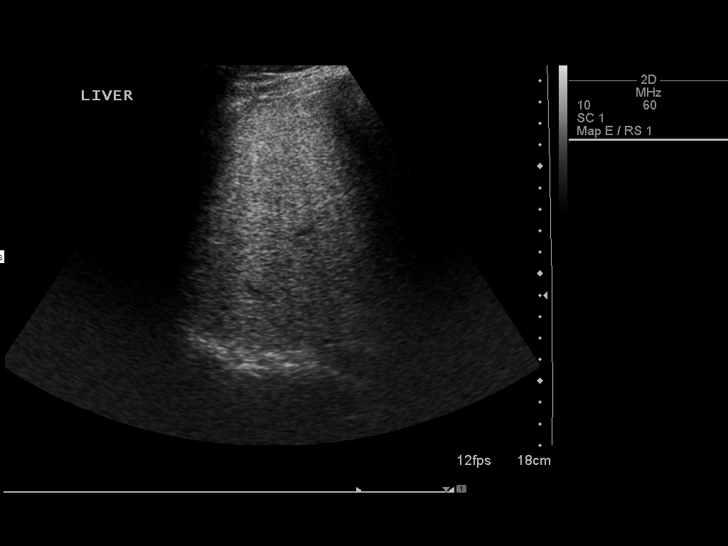
[im 32/64]
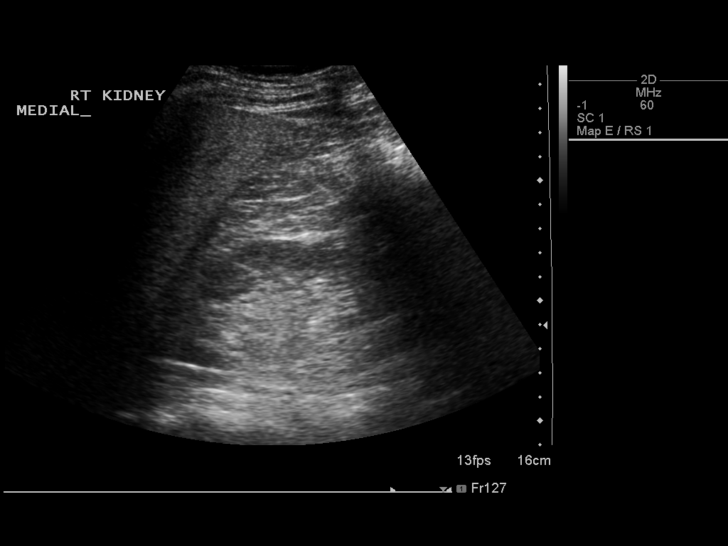
[im 37/64]
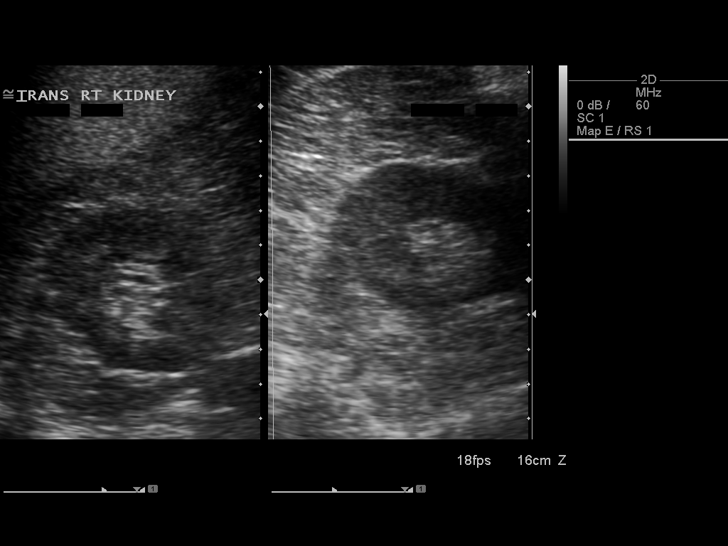
[im 43/64]
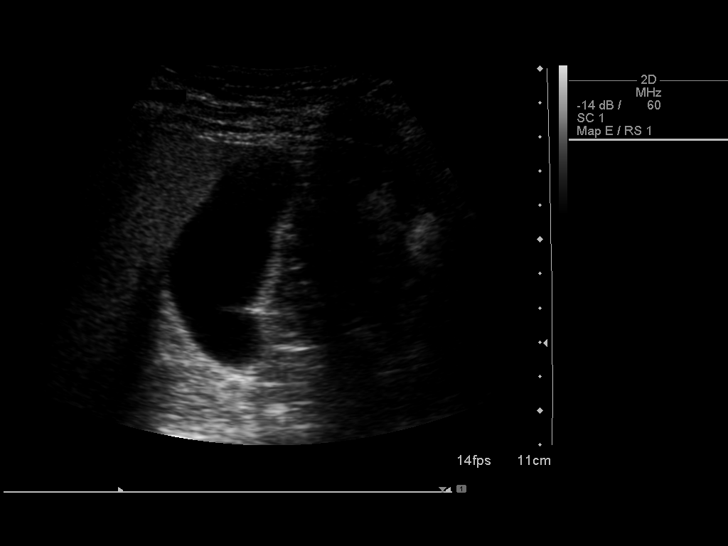
[im 48/64]
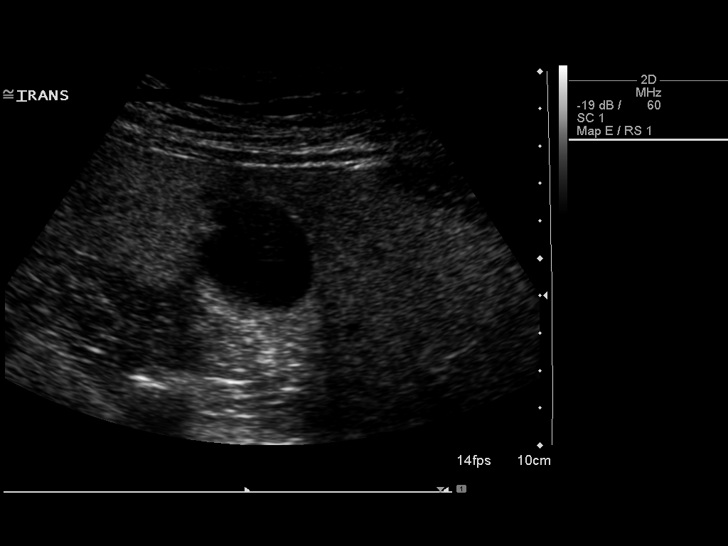
[im 53/64]
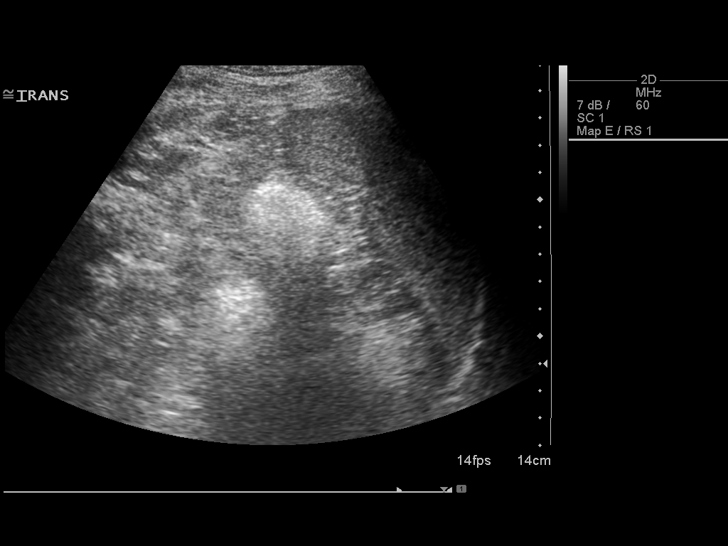
[im 58/64]
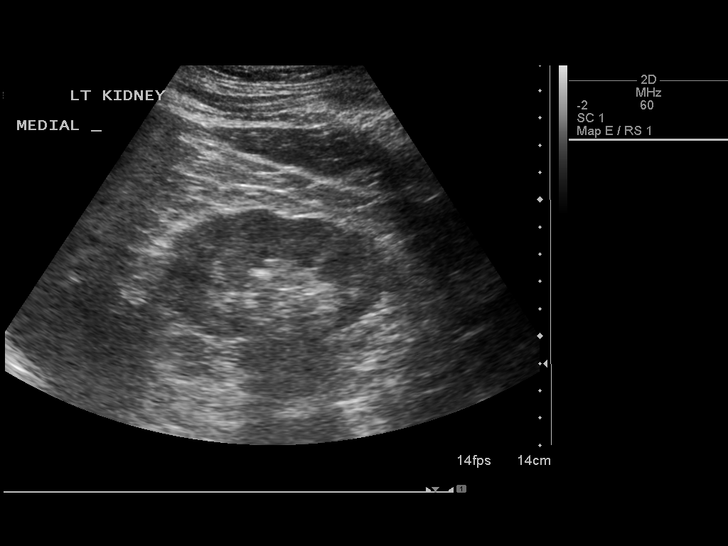
[im 64/64]
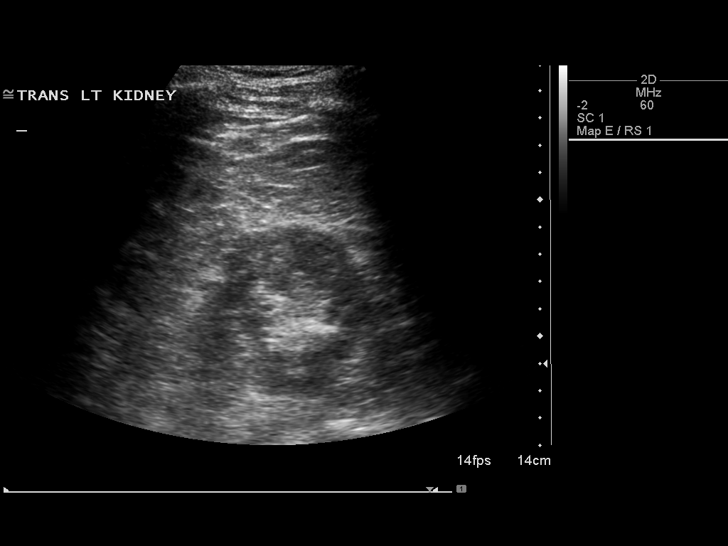

[13 of 25 positions shown; findings below may reference images not displayed]

FINDINGS: Gallbladder: Well distended without wall thickening, stones or
pericholecystic fluid. Negative sonographic Murphy's sign.

Common bile duct:   Normal in caliber without filling defects.

Liver:  The echogenicity is diffusely increased corresponding with
steatosis on prior CT.  No focal lesions are identified.

IVC:  Visualized portions appear unremarkable.

Pancreas:  Visualized portions appear unremarkable.Portions of the
pancreas are obscured by bowel gas.

Spleen:  Visualized portions appear unremarkable.

Right Kidney:  At least one shadowing calculus is demonstrated in
the lower pole. The renal cortical thickness and echogenicity are
preserved.  There is no hydronephrosis or focal abnormality. Renal
length is 11.6 cm.

Left Kidney: Several small renal calculi are noted.  The renal
cortical thickness and echogenicity are preserved.  There is no
hydronephrosis or focal abnormality. Renal length is 10.7 cm.

Abdominal aorta:  Visualized portions appear unremarkable.
IMPRESSION: 1.  Echogenic liver corresponding with steatosis on prior CT.  No
biliary dilatation identified.
2.  Bilateral renal calculi without hydronephrosis.
3.  The pancreas is partly obscured by bowel gas.

## 2015-07-17 ENCOUNTER — Emergency Department (HOSPITAL_COMMUNITY): Payer: 59

## 2015-07-17 ENCOUNTER — Emergency Department (HOSPITAL_COMMUNITY)
Admission: EM | Admit: 2015-07-17 | Discharge: 2015-07-17 | Disposition: A | Discharge status: Home / Self Care | Payer: 59 | Attending: Emergency Medicine | Admitting: Emergency Medicine

## 2015-07-17 ENCOUNTER — Encounter (HOSPITAL_COMMUNITY): Payer: Self-pay | Admitting: Emergency Medicine

## 2015-07-17 DIAGNOSIS — N200 Calculus of kidney: Secondary | ICD-10-CM | POA: Diagnosis not present

## 2015-07-17 DIAGNOSIS — Z7951 Long term (current) use of inhaled steroids: Secondary | ICD-10-CM | POA: Insufficient documentation

## 2015-07-17 DIAGNOSIS — R109 Unspecified abdominal pain: Secondary | ICD-10-CM | POA: Diagnosis present

## 2015-07-17 DIAGNOSIS — Z79899 Other long term (current) drug therapy: Secondary | ICD-10-CM | POA: Diagnosis not present

## 2015-07-17 DIAGNOSIS — M1990 Unspecified osteoarthritis, unspecified site: Secondary | ICD-10-CM | POA: Insufficient documentation

## 2015-07-17 DIAGNOSIS — Z87891 Personal history of nicotine dependence: Secondary | ICD-10-CM | POA: Insufficient documentation

## 2015-07-17 DIAGNOSIS — J45909 Unspecified asthma, uncomplicated: Secondary | ICD-10-CM | POA: Diagnosis not present

## 2015-07-17 DIAGNOSIS — Z7982 Long term (current) use of aspirin: Secondary | ICD-10-CM | POA: Insufficient documentation

## 2015-07-17 DIAGNOSIS — Z960 Presence of urogenital implants: Secondary | ICD-10-CM | POA: Diagnosis not present

## 2015-07-17 DIAGNOSIS — E785 Hyperlipidemia, unspecified: Secondary | ICD-10-CM | POA: Insufficient documentation

## 2015-07-17 LAB — URINALYSIS, ROUTINE W REFLEX MICROSCOPIC
Bilirubin Urine: NEGATIVE
Glucose, UA: NEGATIVE mg/dL
KETONES UR: NEGATIVE mg/dL
Leukocytes, UA: NEGATIVE
Nitrite: NEGATIVE
PROTEIN: 100 mg/dL — AB
Specific Gravity, Urine: 1.022 (ref 1.005–1.030)
UROBILINOGEN UA: 0.2 mg/dL (ref 0.0–1.0)
pH: 6 (ref 5.0–8.0)

## 2015-07-17 LAB — CBC WITH DIFFERENTIAL/PLATELET
Basophils Absolute: 0.1 10*3/uL (ref 0.0–0.1)
Basophils Relative: 1 %
Eosinophils Absolute: 0.1 10*3/uL (ref 0.0–0.7)
Eosinophils Relative: 1 %
HEMATOCRIT: 50 % (ref 39.0–52.0)
Hemoglobin: 16.9 g/dL (ref 13.0–17.0)
LYMPHS ABS: 4.8 10*3/uL — AB (ref 0.7–4.0)
LYMPHS PCT: 49 %
MCH: 32.8 pg (ref 26.0–34.0)
MCHC: 33.8 g/dL (ref 30.0–36.0)
MCV: 96.9 fL (ref 78.0–100.0)
MONOS PCT: 10 %
Monocytes Absolute: 1 10*3/uL (ref 0.1–1.0)
NEUTROS ABS: 3.8 10*3/uL (ref 1.7–7.7)
NEUTROS PCT: 39 %
Platelets: 204 10*3/uL (ref 150–400)
RBC: 5.16 MIL/uL (ref 4.22–5.81)
RDW: 12.9 % (ref 11.5–15.5)
WBC: 9.8 10*3/uL (ref 4.0–10.5)

## 2015-07-17 LAB — BASIC METABOLIC PANEL
Anion gap: 8 (ref 5–15)
BUN: 16 mg/dL (ref 6–20)
CHLORIDE: 109 mmol/L (ref 101–111)
CO2: 27 mmol/L (ref 22–32)
CREATININE: 1.27 mg/dL — AB (ref 0.61–1.24)
Calcium: 9.8 mg/dL (ref 8.9–10.3)
GFR calc Af Amer: >60 mL/min (ref >60)
GFR calc non Af Amer: >60 mL/min (ref >60)
Glucose, Bld: 96 mg/dL (ref 65–99)
Potassium: 3.9 mmol/L (ref 3.5–5.1)
Sodium: 144 mmol/L (ref 135–145)

## 2015-07-17 LAB — URINE MICROSCOPIC-ADD ON

## 2015-07-17 MED ORDER — FENTANYL CITRATE (PF) 100 MCG/2ML IJ SOLN
50.0000 ug | INTRAMUSCULAR | Status: DC | PRN
  Administered 2015-07-17: 50 ug via INTRAVENOUS
  Filled 2015-07-17: qty 2

## 2015-07-17 MED ORDER — ONDANSETRON HCL 4 MG/2ML IJ SOLN
4.0000 mg | INTRAMUSCULAR | Status: DC | PRN
  Administered 2015-07-17: 4 mg via INTRAVENOUS
  Filled 2015-07-17: qty 2

## 2015-07-17 MED ORDER — TAMSULOSIN HCL 0.4 MG PO CAPS: 0.4000 mg | ORAL_CAPSULE | Freq: Every day | ORAL | Status: DC

## 2015-07-17 MED ORDER — OXYCODONE-ACETAMINOPHEN 5-325 MG PO TABS: 1.0000 | ORAL_TABLET | Freq: Four times a day (QID) | ORAL | Status: DC | PRN

## 2015-07-17 NOTE — ED Notes (Signed)
Per EMS pt. From home with complaint of right flank pain at 8/10 which started at 0430 this morning, pt. Also claimed of hematuria , stated that he had history of kidney stones years ago. Denies N/V.

## 2015-07-17 NOTE — ED Provider Notes (Signed)
CSN: [id number]     Arrival date & time 07/17/15  0533 History   First MD Initiated Contact with Patient 07/17/15 0540     Chief Complaint  Patient presents with  . Flank Pain    right  . Hematuria     (Consider location/radiation/quality/duration/timing/severity/associated sxs/prior Treatment) Patient is a 49 y.o. male presenting with flank pain. The history is provided by the patient.  Flank Pain This is a new problem. The current episode started 1 to 2 hours ago. The problem occurs constantly. The problem has not changed since onset.Associated symptoms include abdominal pain. Associated symptoms comments: hematuria. Nothing aggravates the symptoms. Nothing relieves the symptoms. He has tried nothing for the symptoms.    Past Medical History  Diagnosis Date  . Arthritis   . Asthma   . Allergic arthritis   . Renal disorder   . History of nephrolithiasis   . Family history of premature CAD     NUCLEAR STRESS TEST, 12/30/2002 - no evidence of ischemia  . Dyslipidemia    Past Surgical History  Procedure Laterality Date  . Cystoscopy w/ ureteral stent placement  12/02/2011    Procedure: CYSTOSCOPY WITH RETROGRADE PYELOGRAM/URETERAL STENT PLACEMENT;  Surgeon: Ailene Rud, MD;  Location: [facility];  Service: Urology;  Laterality: Right;  . Cystoscopy w/ ureteral stent placement  12/15/2011    Procedure: CYSTOSCOPY WITH STENT REPLACEMENT;  Surgeon: Molli Hazard, MD;  Location: [facility];  Service: Urology;  Laterality: Right;  right reter stent removal and stent placement  . Cystoscopy/retrograde/ureteroscopy  12/15/2011    Procedure: CYSTOSCOPY/RETROGRADE/URETEROSCOPY;  Surgeon: Molli Hazard, MD;  Location: [facility];  Service: Urology;  Laterality: Right;  no retrograde  . Liver biopsy  04/2002  . Upper gi endoscopy  03/2002  . Tonsillectomy  1970   Family History  Problem Relation Age of Onset  . Kidney Stones Mother   . Arthritis Mother 33    Osteo  . Heart  disease Father 78  . Heart attack Maternal Grandfather 60  . Hyperlipidemia Brother    Social History  Substance Use Topics  . Smoking status: Former Smoker -- 0.10 packs/day for 4 years    Types: Cigarettes    Quit date: 09/17/1978  . Smokeless tobacco: Never Used  . Alcohol Use: Yes    Review of Systems  Gastrointestinal: Positive for abdominal pain.  Genitourinary: Positive for flank pain.  All other systems reviewed and are negative.     Allergies  Mushroom extract complex; Peanut-containing drug products; Morphine and related; and Shellfish allergy  Home Medications   Prior to Admission medications   Medication Sig Start Date End Date Taking? Authorizing Provider  Abatacept (ORENCIA) 125 MG/ML SOLN Inject 125 mg into the skin once a week. On sundays    Historical Provider, MD  ALPRAZolam Duanne Moron) 1 MG tablet Take 1 tablet by mouth as needed. 09/20/11   Historical Provider, MD  aspirin EC 81 MG tablet Take 81 mg by mouth daily.    Historical Provider, MD  azelastine (ASTELIN) 137 MCG/SPRAY nasal spray Place 1 spray into the nose 2 (two) times daily. Use in each nostril as directed    Historical Provider, MD  cetirizine (ZYRTEC) 10 MG tablet Take 10 mg by mouth daily.    Historical Provider, MD  ezetimibe (ZETIA) 10 MG tablet Take 1 tablet (10 mg total) by mouth daily. 02/28/15   Pixie Casino, MD  fenofibrate 160 MG tablet Take 160 mg by mouth daily.  Historical Provider, MD  fluticasone (FLONASE) 50 MCG/ACT nasal spray Place 2 sprays into the nose daily.    Historical Provider, MD  Multiple Vitamin (MULITIVITAMIN WITH MINERALS) TABS Take 1 tablet by mouth daily.    Historical Provider, MD  Omega-3 Fatty Acids (FISH OIL) 1200 MG CAPS Take by mouth.    Historical Provider, MD  pantoprazole (PROTONIX) 40 MG tablet Take 40 mg by mouth daily.    Historical Provider, MD  Pitavastatin Calcium 2 MG TABS Take 1 tablet (2 mg total) by mouth daily. 02/28/15   Pixie Casino, MD   Potassium Citrate 15 MEQ (1620 MG) TBCR Take 15 mEq by mouth daily. 01/14/13   Historical Provider, MD  ramelteon (ROZEREM) 8 MG tablet Take 8 mg by mouth daily as needed. For sleep    Historical Provider, MD  triamcinolone cream (KENALOG) 0.1 % as needed. 03/14/14   Historical Provider, MD  UNABLE TO FIND Med Name: allergy shot once weekly    Historical Provider, MD   BP 135/90 mmHg  Temp(Src) 97.7 F (36.5 C) (Oral)  Resp 18  SpO2 100% Physical Exam  Constitutional: He is oriented to person, place, and time. He appears well-developed and well-nourished. No distress.  HENT:  Head: Normocephalic and atraumatic.  Eyes: Conjunctivae are normal.  Neck: Neck supple. No tracheal deviation present.  Cardiovascular: Normal rate and regular rhythm.   Pulmonary/Chest: Effort normal. No respiratory distress.  Abdominal: Soft. He exhibits no distension. There is tenderness in the right lower quadrant. There is CVA tenderness (on right, mild). There is no rigidity, no guarding, no tenderness at McBurney's point and negative Murphy's sign.  Neurological: He is alert and oriented to person, place, and time.  Skin: Skin is warm and dry.  Psychiatric: He has a normal mood and affect.    ED Course  Procedures (including critical care time) Labs Review Labs Reviewed  CBC WITH DIFFERENTIAL/PLATELET - Abnormal; Notable for the following:    Lymphs Abs 4.8 (*)    All other components within normal limits  BASIC METABOLIC PANEL - Abnormal; Notable for the following:    Creatinine, Ser 1.27 (*)    All other components within normal limits  URINALYSIS, ROUTINE W REFLEX MICROSCOPIC (NOT AT [facility]) - Abnormal; Notable for the following:    Color, Urine AMBER (*)    APPearance CLOUDY (*)    Hgb urine dipstick LARGE (*)    Protein, ur 100 (*)    All other components within normal limits  URINE MICROSCOPIC-ADD ON - Abnormal; Notable for the following:    Bacteria, UA FEW (*)    All other components within  normal limits  URINE CULTURE    Imaging Review Ct Renal Stone Study  07/17/2015  CLINICAL DATA:  Acute onset of right flank pain and hematuria. Initial encounter. EXAM: CT ABDOMEN AND PELVIS WITHOUT CONTRAST TECHNIQUE: Multidetector CT imaging of the abdomen and pelvis was performed following the standard protocol without IV contrast. COMPARISON:  Abdominal ultrasound performed 01/21/2013, and CT of the abdomen and pelvis performed 12/01/2011 FINDINGS: The visualized lung bases are clear. Vague heterogeneity is noted within the liver, much more prominent than on the prior study. Though this could simply reflect fatty infiltration, underlying mass cannot be entirely excluded. Would correlate with LFTs and consider dynamic liver protocol MRI or CT for further evaluation. The spleen is unremarkable in appearance. The gallbladder is within normal limits. The pancreas and adrenal glands are unremarkable. Bilateral nonobstructing renal stones are seen, more  prominent on the left, measuring up to 9 mm in size. Mild nonspecific perinephric stranding is noted bilaterally. No significant hydronephrosis is seen. However, there is mild asymmetric prominence of the right ureter, with mild surrounding soft tissue stranding. A 5 mm likely intermittently obstructing stone is noted within the distal right ureter, just above the right vesicoureteral junction. No free fluid is identified. The small bowel is unremarkable in appearance. The stomach is within normal limits. No acute vascular abnormalities are seen. Minimal calcification is noted along the abdominal aorta and its branches. The appendix is normal in caliber, without evidence of appendicitis. The colon is unremarkable in appearance. The bladder is decompressed and not well assessed. The prostate remains normal in size. No inguinal lymphadenopathy is seen. No acute osseous abnormalities are identified. IMPRESSION: 1. 5 mm likely intermittently obstructing stone noted  in the distal right ureter, just above the right vesicoureteral junction. Associated mild asymmetric prominence of the right ureter, with mild surrounding soft tissue stranding. No significant hydronephrosis seen. 2. Bilateral nonobstructing renal stones, more prominent on the left, measuring up to 9 mm in size. 3. Vague areas of heterogeneity within the liver, more prominent than in 2013. Though this could simply reflect fatty infiltration, underlying mass cannot be entirely excluded. Would correlate with LFTs and consider dynamic liver protocol MRI or CT for further evaluation. Electronically Signed   By: Garald Balding M.D.   On: 07/17/2015 07:06   I have personally reviewed and evaluated these images and lab results as part of my medical decision-making.   EKG Interpretation None      MDM   Final diagnoses:  Flank pain  Kidney stone    49 y.o. male presents with right flank and right quadrant abdominal pain that is consistent with prior nephrolithiasis that has required stent placement for removal of obstructing stone. Hematuria evident on UA, creatinine is at baseline, no evidence of infection or typical symptoms of infection currently. CT scan ordered for evaluation for obstruction, location, number of stones. Has intermittent obstructing right 5 mm stone. Given pain meds and flomax for home, referred to urology if unable to pass spontaneously.     Leo Grosser, MD 07/17/15 [phone]

## 2015-07-17 NOTE — ED Notes (Signed)
Bed: GB20 Expected date:  Expected time:  Means of arrival:  Comments: EMS groin pain, Hematuria

## 2015-07-17 NOTE — Discharge Instructions (Signed)
Flank Pain °Flank pain refers to pain that is located on the side of the body between the upper abdomen and the back. The pain may occur over a short period of time (acute) or may be long-term or reoccurring (chronic). It may be mild or severe. Flank pain can be caused by many things. °CAUSES  °Some of the more common causes of flank pain include: °· Muscle strains.   °· Muscle spasms.   °· A disease of your spine (vertebral disk disease).   °· A lung infection (pneumonia).   °· Fluid around your lungs (pulmonary edema).   °· A kidney infection.   °· Kidney stones.   °· A very painful skin rash caused by the chickenpox virus (shingles).   °· Gallbladder disease.   °HOME CARE INSTRUCTIONS  °Home care will depend on the cause of your pain. In general, °· Rest as directed by your caregiver. °· Drink enough fluids to keep your urine clear or pale yellow. °· Only take over-the-counter or prescription medicines as directed by your caregiver. Some medicines may help relieve the pain. °· Tell your caregiver about any changes in your pain. °· Follow up with your caregiver as directed. °SEEK IMMEDIATE MEDICAL CARE IF:  °· Your pain is not controlled with medicine.   °· You have new or worsening symptoms. °· Your pain increases.   °· You have abdominal pain.   °· You have shortness of breath.   °· You have persistent nausea or vomiting.   °· You have swelling in your abdomen.   °· You feel faint or pass out.   °· You have blood in your urine. °· You have a fever or persistent symptoms for more than 2-3 days. °· You have a fever and your symptoms suddenly get worse. °MAKE SURE YOU:  °· Understand these instructions. °· Will watch your condition. °· Will get help right away if you are not doing well or get worse. °  °This information is not intended to replace advice given to you by your health care provider. Make sure you discuss any questions you have with your health care provider. °  °Document Released: 10/25/2005 Document  Revised: 05/28/2012 Document Reviewed: 04/17/2012 °Elsevier Interactive Patient Education ©2016 [employer] ° °

## 2015-07-18 LAB — URINE CULTURE: Culture: NO GROWTH

## 2015-09-26 ENCOUNTER — Other Ambulatory Visit: Payer: Self-pay | Admitting: *Deleted

## 2015-09-26 MED ORDER — EZETIMIBE 10 MG PO TABS: 10.0000 mg | ORAL_TABLET | Freq: Every day | ORAL | Status: DC

## 2015-09-30 ENCOUNTER — Other Ambulatory Visit: Payer: Self-pay | Admitting: *Deleted

## 2015-09-30 MED ORDER — EZETIMIBE 10 MG PO TABS: 10.0000 mg | ORAL_TABLET | Freq: Every day | ORAL | Status: DC

## 2015-10-06 ENCOUNTER — Other Ambulatory Visit: Payer: Self-pay | Admitting: Internal Medicine

## 2015-10-06 MED ORDER — EZETIMIBE 10 MG PO TABS: 10.0000 mg | ORAL_TABLET | Freq: Every day | ORAL | Status: DC

## 2016-03-15 ENCOUNTER — Ambulatory Visit (INDEPENDENT_AMBULATORY_CARE_PROVIDER_SITE_OTHER): Payer: 59 | Admitting: Internal Medicine

## 2016-03-15 ENCOUNTER — Encounter: Payer: Self-pay | Admitting: Internal Medicine

## 2016-03-15 VITALS — BP 112/80 | HR 70 | Ht 69.0 in | Wt 177.8 lb

## 2016-03-15 DIAGNOSIS — R938 Abnormal findings on diagnostic imaging of other specified body structures: Secondary | ICD-10-CM

## 2016-03-15 DIAGNOSIS — M069 Rheumatoid arthritis, unspecified: Secondary | ICD-10-CM

## 2016-03-15 DIAGNOSIS — E785 Hyperlipidemia, unspecified: Secondary | ICD-10-CM

## 2016-03-15 DIAGNOSIS — R931 Abnormal findings on diagnostic imaging of heart and coronary circulation: Secondary | ICD-10-CM

## 2016-03-15 MED ORDER — PITAVASTATIN CALCIUM 2 MG PO TABS: 2.0000 mg | ORAL_TABLET | Freq: Every day | ORAL | Status: DC

## 2016-03-15 NOTE — Progress Notes (Signed)
OFFICE NOTE  Chief Complaint:  Routine follow-up  Primary Care Physician: Jilda Panda, MD  HPI:  Stuart Collins is a 50 year old gentleman with a history of rheumatoid arthritis, on methotrexate and Orencia. He also has dyslipidemia, for which we are following. From a cholesterol standpoint, he follows a very low-cholesterol diet; however, does have a history of steatohepatitis and is not currently on a statin, and is taking Zetia and fenofibrate for management of this. From what I could tell, his last lipid profile was in April of 2012 and would bear repeating at this time. Otherwise, he is asymptomatic. When following his lipid profile, we saw that he did have a lower LDL cholesterol when he was exercising more. Recently spent taking care of his mother who had spinal surgery and his fallen off of his exercise routine. He wishes to get back to that, to modify his risk factors for coronary disease.  Stuart Collins returns for followup today and is doing well. His cholesterol maintains good control with an LDL of 95. He is not on a statin due to a history of steatohepatitis.  He is curious as to whether or not he has any coronary artery disease at this point or not.  I saw Stuart Collins back in the office today. Overall he is doing well. He reports his arthritis is well controlled on the Orencia. He's currently on Zetia 10 mg, fenofibrate 160 and Livalo 2 mg daily. Livalo was specifically chosen because of his history of steatohepatitis. There is clear evidence that this medicine is very unlikely to cause liver enzyme abnormalities, therefore there is no generic statin alternative for this medication. He reports his exercise has been less than he would desire. Weight is fairly stable. Blood pressure is well-controlled.  03/15/2016  Stuart Collins is seen today for follow-up. Over the past year he said no new events or complaints. He reports his arthritis is fairly well-controlled. He is on that area,  fenofibrate and atorvastatin for cholesterol. He recently had lab work including a metabolic profile and lipid profile drawn by her primary care provider. I will request those records today.  PMHx:  Past Medical History  Diagnosis Date  . Arthritis   . Asthma   . Allergic arthritis   . Renal disorder   . History of nephrolithiasis   . Family history of premature CAD     NUCLEAR STRESS TEST, 12/30/2002 - no evidence of ischemia  . Dyslipidemia     Past Surgical History  Procedure Laterality Date  . Cystoscopy w/ ureteral stent placement  12/02/2011    Procedure: CYSTOSCOPY WITH RETROGRADE PYELOGRAM/URETERAL STENT PLACEMENT;  Surgeon: Ailene Rud, MD;  Location: [facility];  Service: Urology;  Laterality: Right;  . Cystoscopy w/ ureteral stent placement  12/15/2011    Procedure: CYSTOSCOPY WITH STENT REPLACEMENT;  Surgeon: Molli Hazard, MD;  Location: [facility];  Service: Urology;  Laterality: Right;  right reter stent removal and stent placement  . Cystoscopy/retrograde/ureteroscopy  12/15/2011    Procedure: CYSTOSCOPY/RETROGRADE/URETEROSCOPY;  Surgeon: Molli Hazard, MD;  Location: [facility];  Service: Urology;  Laterality: Right;  no retrograde  . Liver biopsy  04/2002  . Upper gi endoscopy  03/2002  . Tonsillectomy  1970    FAMHx:  Family History  Problem Relation Age of Onset  . Kidney Stones Mother   . Arthritis Mother 45    Osteo  . Heart disease Father 32  . Heart attack Maternal Grandfather 60  . Hyperlipidemia Brother  SOCHx:   reports that he quit smoking about 37 years ago. His smoking use included Cigarettes. He has a .4 pack-year smoking history. He has never used smokeless tobacco. He reports that he drinks alcohol. He reports that he does not use illicit drugs.  ALLERGIES:  Allergies  Allergen Reactions  . Mushroom Extract Complex Shortness Of Breath  . Peanut-Containing Drug Products Shortness Of Breath    All nuts  . Morphine And Related       REACTION: "makes my head feel like its stuffed; like it's going to pop"  . Shellfish Allergy     ROS: A comprehensive review of systems was negative.  HOME MEDS: Current Outpatient Prescriptions  Medication Sig Dispense Refill  . Abatacept (ORENCIA) 125 MG/ML SOLN Inject 125 mg into the skin once a week. On sundays    . ALPRAZolam (XANAX) 1 MG tablet Take 1 tablet by mouth as needed.    Marland Kitchen aspirin EC 81 MG tablet Take 81 mg by mouth daily.    Marland Kitchen azelastine (ASTELIN) 137 MCG/SPRAY nasal spray Place 1 spray into the nose 2 (two) times daily. Use in each nostril as directed    . cetirizine (ZYRTEC) 10 MG tablet Take 10 mg by mouth daily.    Marland Kitchen ezetimibe (ZETIA) 10 MG tablet Take 1 tablet (10 mg total) by mouth daily. 90 tablet 1  . fenofibrate 160 MG tablet Take 160 mg by mouth daily.    . fluticasone (FLONASE) 50 MCG/ACT nasal spray Place 2 sprays into the nose daily.    . montelukast (SINGULAIR) 10 MG tablet Take 1 tablet by mouth daily.    . Multiple Vitamin (MULITIVITAMIN WITH MINERALS) TABS Take 1 tablet by mouth daily.    . Omega-3 Fatty Acids (FISH OIL) 1200 MG CAPS Take by mouth.    . pantoprazole (PROTONIX) 40 MG tablet Take 40 mg by mouth daily.    . Pitavastatin Calcium 2 MG TABS Take 1 tablet (2 mg total) by mouth daily. 30 tablet 11  . Potassium Citrate 15 MEQ (1620 MG) TBCR Take 15 mEq by mouth daily.    . ramelteon (ROZEREM) 8 MG tablet Take 8 mg by mouth daily as needed. For sleep    . tamsulosin (FLOMAX) 0.4 MG CAPS capsule Take 1 capsule (0.4 mg total) by mouth daily. 10 capsule 0  . triamcinolone cream (KENALOG) 0.1 % as needed.    Marland Kitchen UNABLE TO FIND Med Name: allergy shot once weekly     No current facility-administered medications for this visit.    LABS/IMAGING: No results found for this or any previous visit (from the past 48 hour(s)). No results found.  VITALS: BP 112/80 mmHg  Pulse 70  Ht 5\' 9"  (1.753 m)  Wt 177 lb 12.8 oz (80.65 kg)  BMI 26.24  kg/m2  EXAM: General appearance: alert and no distress Neck: no adenopathy, no carotid bruit, no JVD, supple, symmetrical, trachea midline and thyroid not enlarged, symmetric, no tenderness/mass/nodules Lungs: clear to auscultation bilaterally Heart: regular rate and rhythm, S1, S2 normal, no murmur, click, rub or gallop Abdomen: soft, non-tender; bowel sounds normal; no masses,  no organomegaly Extremities: extremities normal, atraumatic, no cyanosis or edema Pulses: 2+ and symmetric Skin: Skin color, texture, turgor normal. No rashes or lesions Neurologic: Grossly normal  EKG: Sinus rhythm at 70  ASSESSMENT: 1. Rheumatoid arthritis 2. Dyslipidemia 3. Elevated liver function tests - steatohepatitis 4. Coronary artery disease-mid LAD coronary calcium  PLAN: 1.   Stuart Collins is  doing well from a cardiovascular standpoint.  His rheumatoid arthritis is well controlled and followed by Dr. Charlestine Night. He had coronary artery calcium testing which showed a calcified plaque in the mid LAD and a score of 65. He will need continued aggressive lipid management. We will obtain the results of his recent cholesterol testing from his primary care provider. Otherwise no changes to his medications today. Follow-up with me annually or sooner as necessary.  Pixie Casino, MD, [facility] Attending Cardiologist [place] 03/15/2016, 8:10 AM

## 2016-03-15 NOTE — Patient Instructions (Addendum)
Medication Instructions:  No Changes  Labwork: None  Testing/Procedures: None Ordered  Follow-Up: In 1 Year with Dr Debara Pickett  Any Other Special Instructions Will Be Listed Below (If Applicable).     If you need a refill on your cardiac medications before your next appointment, please call your pharmacy.  livalo 2mg  samples (1 pack) given to patient w/copay card

## 2016-04-08 ENCOUNTER — Other Ambulatory Visit: Payer: Self-pay | Admitting: Internal Medicine

## 2016-07-08 ENCOUNTER — Other Ambulatory Visit: Payer: Self-pay | Admitting: Internal Medicine

## 2017-03-15 ENCOUNTER — Encounter: Payer: Self-pay | Admitting: Internal Medicine

## 2017-03-15 ENCOUNTER — Ambulatory Visit (INDEPENDENT_AMBULATORY_CARE_PROVIDER_SITE_OTHER): Payer: 59 | Admitting: Internal Medicine

## 2017-03-15 VITALS — BP 134/83 | HR 77 | Ht 69.0 in | Wt 174.4 lb

## 2017-03-15 DIAGNOSIS — R931 Abnormal findings on diagnostic imaging of heart and coronary circulation: Secondary | ICD-10-CM

## 2017-03-15 DIAGNOSIS — M069 Rheumatoid arthritis, unspecified: Secondary | ICD-10-CM

## 2017-03-15 DIAGNOSIS — E782 Mixed hyperlipidemia: Secondary | ICD-10-CM | POA: Diagnosis not present

## 2017-03-15 NOTE — Progress Notes (Signed)
OFFICE NOTE  Chief Complaint:  Routine follow-up  Primary Care Physician: Jilda Panda, MD  HPI:  Stuart Collins is a 51 year old gentleman with a history of rheumatoid arthritis, on methotrexate and Orencia. He also has dyslipidemia, for which we are following. From a cholesterol standpoint, he follows a very low-cholesterol diet; however, does have a history of steatohepatitis and is not currently on a statin, and is taking Zetia and fenofibrate for management of this. From what I could tell, his last lipid profile was in April of 2012 and would bear repeating at this time. Otherwise, he is asymptomatic. When following his lipid profile, we saw that he did have a lower LDL cholesterol when he was exercising more. Recently spent taking care of his mother who had spinal surgery and his fallen off of his exercise routine. He wishes to get back to that, to modify his risk factors for coronary disease.  [name] returns for followup today and is doing well. His cholesterol maintains good control with an LDL of 95. He is not on a statin due to a history of steatohepatitis.  He is curious as to whether or not he has any coronary artery disease at this point or not.  I saw [name] back in the office today. Overall he is doing well. He reports his arthritis is well controlled on the Orencia. He's currently on Zetia 10 mg, fenofibrate 160 and Livalo 2 mg daily. Livalo was specifically chosen because of his history of steatohepatitis. There is clear evidence that this medicine is very unlikely to cause liver enzyme abnormalities, therefore there is no generic statin alternative for this medication. He reports his exercise has been less than he would desire. Weight is fairly stable. Blood pressure is well-controlled.  03/15/2016  [name] is seen today for follow-up. Over the past year he said no new events or complaints. He reports his arthritis is fairly well-controlled. He is on that area,  fenofibrate and atorvastatin for cholesterol. He recently had lab work including a metabolic profile and lipid profile drawn by her primary care provider. I will request those records today.  03/15/2017  [name] is seen today in follow-up. He reports good control over his rheumatoid arthritis. Recently his rheumatologist retired but he's switched over to see Dr. Amil Amen. His dyslipidemia was recently assessed by his primary care provider will obtain those results to review. He is on A statin, ezetimibe and fenofibrate. His goal LDL-C is less than 70 with his history of mid LAD coronary artery calcification seen on calcium scoring. He is asymptomatic and denies any chest pain or worsening shortness of breath. He reports his liver enzymes have been stable. He exercises 2-3 times a week on a treadmill.  PMHx:  Past Medical History:  Diagnosis Date  . Allergic arthritis   . Arthritis   . Asthma   . Dyslipidemia   . Family history of premature CAD    NUCLEAR STRESS TEST, 12/30/2002 - no evidence of ischemia  . History of nephrolithiasis   . Renal disorder     Past Surgical History:  Procedure Laterality Date  . CYSTOSCOPY W/ URETERAL STENT PLACEMENT  12/02/2011   Procedure: CYSTOSCOPY WITH RETROGRADE PYELOGRAM/URETERAL STENT PLACEMENT;  Surgeon: Ailene Rud, MD;  Location: [facility];  Service: Urology;  Laterality: Right;  . CYSTOSCOPY W/ URETERAL STENT PLACEMENT  12/15/2011   Procedure: CYSTOSCOPY WITH STENT REPLACEMENT;  Surgeon: Molli Hazard, MD;  Location: [facility];  Service: Urology;  Laterality: Right;  right reter stent removal and stent placement  . CYSTOSCOPY/RETROGRADE/URETEROSCOPY  12/15/2011   Procedure: CYSTOSCOPY/RETROGRADE/URETEROSCOPY;  Surgeon: Molli Hazard, MD;  Location: [facility];  Service: Urology;  Laterality: Right;  no retrograde  . LIVER BIOPSY  04/2002  . TONSILLECTOMY  1970  . UPPER GI ENDOSCOPY  03/2002    FAMHx:  Family History  Problem Relation  Age of Onset  . Kidney Stones Mother   . Arthritis Mother 16       Osteo  . Heart disease Father 68  . Heart attack Maternal Grandfather 60  . Hyperlipidemia Brother     SOCHx:   reports that he quit smoking about 38 years ago. His smoking use included Cigarettes. He has a 0.40 pack-year smoking history. He has never used smokeless tobacco. He reports that he drinks alcohol. He reports that he does not use drugs.  ALLERGIES:  Allergies  Allergen Reactions  . Mushroom Extract Complex Shortness Of Breath  . Peanut-Containing Drug Products Shortness Of Breath    All nuts  . Morphine And Related     REACTION: "makes my head feel like its stuffed; like it's going to pop"  . Shellfish Allergy     ROS: Pertinent items noted in HPI and remainder of comprehensive ROS otherwise negative.  HOME MEDS: Current Outpatient Prescriptions  Medication Sig Dispense Refill  . Abatacept (ORENCIA) 125 MG/ML SOLN Inject 125 mg into the skin once a week. On sundays    . ALPRAZolam (XANAX) 1 MG tablet Take 1 tablet by mouth as needed.    Marland Kitchen aspirin EC 81 MG tablet Take 81 mg by mouth daily.    Marland Kitchen azelastine (ASTELIN) 137 MCG/SPRAY nasal spray Place 1 spray into the nose 2 (two) times daily. Use in each nostril as directed    . cetirizine (ZYRTEC) 10 MG tablet Take 10 mg by mouth daily.    Marland Kitchen ezetimibe (ZETIA) 10 MG tablet Take 1 tablet (10 mg total) by mouth daily. 90 tablet 3  . fenofibrate 160 MG tablet Take 160 mg by mouth daily.    . fluticasone (FLONASE) 50 MCG/ACT nasal spray Place 2 sprays into the nose daily.    . montelukast (SINGULAIR) 10 MG tablet Take 1 tablet by mouth daily.    . Multiple Vitamin (MULITIVITAMIN WITH MINERALS) TABS Take 1 tablet by mouth daily.    . Omega-3 Fatty Acids (FISH OIL) 1200 MG CAPS Take by mouth.    . pantoprazole (PROTONIX) 40 MG tablet Take 40 mg by mouth daily.    . Pitavastatin Calcium 2 MG TABS Take 1 tablet (2 mg total) by mouth daily. 30 tablet 11  .  Potassium Citrate 15 MEQ (1620 MG) TBCR Take 15 mEq by mouth daily.    . ramelteon (ROZEREM) 8 MG tablet Take 8 mg by mouth daily as needed. For sleep    . triamcinolone cream (KENALOG) 0.1 % as needed.    Marland Kitchen UNABLE TO FIND Med Name: allergy shot once weekly     No current facility-administered medications for this visit.     LABS/IMAGING: No results found for this or any previous visit (from the past 48 hour(s)). No results found.  VITALS: BP 134/83   Pulse 77   Ht 5\' 9"  (1.753 m)   Wt 174 lb 6.4 oz (79.1 kg)   BMI 25.75 kg/m   EXAM: General appearance: alert and no distress Neck: no carotid bruit, no JVD and thyroid not enlarged, symmetric, no tenderness/mass/nodules  Lungs: clear to auscultation bilaterally Heart: regular rate and rhythm, S1, S2 normal, no murmur, click, rub or gallop Abdomen: soft, non-tender; bowel sounds normal; no masses,  no organomegaly Extremities: extremities normal, atraumatic, no cyanosis or edema Pulses: 2+ and symmetric Skin: Skin color, texture, turgor normal. No rashes or lesions Neurologic: Grossly normal Psych: Pleasant  EKG: Sinus rhythm with mild sinus arrhythmia at 77  ASSESSMENT: 1. Rheumatoid arthritis 2. Dyslipidemia 3. Elevated liver function tests - steatohepatitis 4. Coronary artery disease-mid LAD coronary calcium (CAC 65)  PLAN: 1.   Mr. Brummitt continues to do well and is asymptomatic. Denies any chest pain worsening shortness of breath and exercises regularly. He has stable rheumatoid arthritis and a switch providers after his rheumatologist retired. He recently had a lipid profile from his primary care provider which showed total cholesterol 126, triglycerides 114, HDL 44 and LDL-C 59 with non-HDL cholesterol of 82. This represents excellent control and is currently at goal. Advise no changes to his current regimen. Plan follow-up with me annually or sooner as necessary.  Pixie Casino, MD, [facility] Attending Cardiologist Apollo C Hilty 03/15/2017, 8:33 AM

## 2017-03-15 NOTE — Patient Instructions (Signed)
Your physician wants you to follow-up in: ONE YEAR with Dr. Hilty. You will receive a reminder letter in the mail two months in advance. If you don't receive a letter, please call our office to schedule the follow-up appointment.  

## 2017-03-18 ENCOUNTER — Other Ambulatory Visit: Payer: Self-pay | Admitting: Internal Medicine

## 2017-07-21 ENCOUNTER — Other Ambulatory Visit: Payer: Self-pay | Admitting: Internal Medicine

## 2017-11-17 ENCOUNTER — Emergency Department (HOSPITAL_COMMUNITY): Payer: 59

## 2017-11-17 ENCOUNTER — Encounter (HOSPITAL_COMMUNITY): Payer: Self-pay | Admitting: *Deleted

## 2017-11-17 ENCOUNTER — Emergency Department (HOSPITAL_COMMUNITY)
Admission: EM | Admit: 2017-11-17 | Discharge: 2017-11-17 | Disposition: A | Discharge status: Home / Self Care | Payer: 59 | Attending: Emergency Medicine | Admitting: Emergency Medicine

## 2017-11-17 DIAGNOSIS — Z7982 Long term (current) use of aspirin: Secondary | ICD-10-CM | POA: Insufficient documentation

## 2017-11-17 DIAGNOSIS — Z87891 Personal history of nicotine dependence: Secondary | ICD-10-CM | POA: Insufficient documentation

## 2017-11-17 DIAGNOSIS — K219 Gastro-esophageal reflux disease without esophagitis: Secondary | ICD-10-CM | POA: Insufficient documentation

## 2017-11-17 DIAGNOSIS — Z8719 Personal history of other diseases of the digestive system: Secondary | ICD-10-CM

## 2017-11-17 DIAGNOSIS — Z79899 Other long term (current) drug therapy: Secondary | ICD-10-CM | POA: Diagnosis not present

## 2017-11-17 DIAGNOSIS — R1013 Epigastric pain: Secondary | ICD-10-CM | POA: Diagnosis present

## 2017-11-17 DIAGNOSIS — R740 Nonspecific elevation of levels of transaminase and lactic acid dehydrogenase [LDH]: Secondary | ICD-10-CM | POA: Diagnosis not present

## 2017-11-17 DIAGNOSIS — R1011 Right upper quadrant pain: Secondary | ICD-10-CM

## 2017-11-17 DIAGNOSIS — R110 Nausea: Secondary | ICD-10-CM

## 2017-11-17 DIAGNOSIS — R7401 Elevation of levels of liver transaminase levels: Secondary | ICD-10-CM

## 2017-11-17 DIAGNOSIS — R17 Unspecified jaundice: Secondary | ICD-10-CM | POA: Diagnosis not present

## 2017-11-17 DIAGNOSIS — Z91010 Allergy to peanuts: Secondary | ICD-10-CM | POA: Diagnosis not present

## 2017-11-17 LAB — URINALYSIS, ROUTINE W REFLEX MICROSCOPIC
Bilirubin Urine: NEGATIVE
Glucose, UA: NEGATIVE mg/dL
Hgb urine dipstick: NEGATIVE
Ketones, ur: NEGATIVE mg/dL
Leukocytes, UA: NEGATIVE
Nitrite: NEGATIVE
Protein, ur: NEGATIVE mg/dL
Specific Gravity, Urine: 1.004 — ABNORMAL LOW (ref 1.005–1.030)
pH: 8 (ref 5.0–8.0)

## 2017-11-17 LAB — COMPREHENSIVE METABOLIC PANEL
ALBUMIN: 4.5 g/dL (ref 3.5–5.0)
ALT: 143 U/L — AB (ref 17–63)
ANION GAP: 10 (ref 5–15)
AST: 211 U/L — ABNORMAL HIGH (ref 15–41)
Alkaline Phosphatase: 49 U/L (ref 38–126)
BUN: 13 mg/dL (ref 6–20)
CHLORIDE: 105 mmol/L (ref 101–111)
CO2: 26 mmol/L (ref 22–32)
CREATININE: 1.21 mg/dL (ref 0.61–1.24)
Calcium: 10.4 mg/dL — ABNORMAL HIGH (ref 8.9–10.3)
GFR calc non Af Amer: >60 mL/min (ref >60)
GLUCOSE: 103 mg/dL — AB (ref 65–99)
Potassium: 3.9 mmol/L (ref 3.5–5.1)
SODIUM: 141 mmol/L (ref 135–145)
Total Bilirubin: 1.7 mg/dL — ABNORMAL HIGH (ref 0.3–1.2)
Total Protein: 7 g/dL (ref 6.5–8.1)

## 2017-11-17 LAB — I-STAT TROPONIN, ED: Troponin i, poc: 0 ng/mL (ref 0.00–0.08)

## 2017-11-17 LAB — LIPASE, BLOOD: LIPASE: 37 U/L (ref 11–51)

## 2017-11-17 LAB — CBC
HCT: 47.9 % (ref 39.0–52.0)
HEMOGLOBIN: 16.6 g/dL (ref 13.0–17.0)
MCH: 33.2 pg (ref 26.0–34.0)
MCHC: 34.7 g/dL (ref 30.0–36.0)
MCV: 95.8 fL (ref 78.0–100.0)
Platelets: 243 10*3/uL (ref 150–400)
RBC: 5 MIL/uL (ref 4.22–5.81)
RDW: 13.1 % (ref 11.5–15.5)
WBC: 10.2 10*3/uL (ref 4.0–10.5)

## 2017-11-17 MED ORDER — GI COCKTAIL ~~LOC~~
30.0000 mL | Freq: Once | ORAL | Status: AC
  Administered 2017-11-17: 30 mL via ORAL
  Filled 2017-11-17: qty 30

## 2017-11-17 MED ORDER — FAMOTIDINE IN NACL 20-0.9 MG/50ML-% IV SOLN
20.0000 mg | Freq: Once | INTRAVENOUS | Status: AC
  Administered 2017-11-17: 20 mg via INTRAVENOUS
  Filled 2017-11-17: qty 50

## 2017-11-17 MED ORDER — ONDANSETRON 4 MG PO TBDP: 4.0000 mg | ORAL_TABLET | Freq: Three times a day (TID) | ORAL | 0 refills | Status: DC | PRN

## 2017-11-17 MED ORDER — ONDANSETRON HCL 4 MG/2ML IJ SOLN
4.0000 mg | Freq: Once | INTRAMUSCULAR | Status: AC
  Administered 2017-11-17: 4 mg via INTRAVENOUS
  Filled 2017-11-17: qty 2

## 2017-11-17 MED ORDER — RANITIDINE HCL 150 MG PO TABS: 150.0000 mg | ORAL_TABLET | Freq: Two times a day (BID) | ORAL | 0 refills | Status: DC

## 2017-11-17 NOTE — ED Provider Notes (Signed)
Stuart Collins Provider Note   CSN: [id number] Arrival date & time: 11/17/17  1355     History   Chief Complaint Chief Complaint  Patient presents with  . Abdominal Pain    HPI FRANZ SVEC is a 52 y.o. male with a PMHx of asthma, "fatty liver", nephrolithiasis w/p cystoscopy with stent placement in 11/2011, HLD, RA, and other conditions listed below, who presents to the ED with complaints of intermittent epigastric abdominal pain for the last 3 days that became more constant and worse today.  Patient describes the pain as 8/10 intermittent initially but constant now epigastric abdominal pain that radiates into his right upper quadrant and right upper back, pressure-like and sharp in nature, worse with lying on his right side, mildly improved with simethicone, and unrelieved with antacids.  He reports associated mild nausea.  He denies that this feels like prior kidney stones.  His PCP is Dr. Mellody Drown, and his GI specialist is Dr. Teena Irani however he hasn't seen him in ~9yrs.  He had cereal and hot tea for breakfast around 8:30pm, and then had some 7Up around noon with a few sips of water between breakfast and the 7Up.  He denies fevers, chills, CP, SOB, vomiting, diarrhea/constipation, obstipation, melena, hematochezia, hematuria, dysuria, myalgias, arthralgias, numbness, tingling, focal weakness, or any other complaints at this time. Denies recent travel, sick contacts, suspicious food intake, EtOH use, NSAID use, or prior abd surgeries.    The history is provided by the patient and medical records. No language interpreter was used.  Abdominal Pain   This is a new problem. The current episode started more than 2 days ago. The problem occurs constantly (intermittent initially but constant today). The problem has been gradually worsening. The pain is associated with an unknown factor. The pain is located in the RUQ and epigastric region. The quality of the pain  is pressure-like and sharp. The pain is at a severity of 8/10. The pain is moderate. Associated symptoms include nausea. Pertinent negatives include fever, diarrhea, flatus, hematochezia, melena, vomiting, constipation, dysuria, hematuria, arthralgias and myalgias. The symptoms are aggravated by certain positions. The symptoms are relieved by OTC medications.    Past Medical History:  Diagnosis Date  . Allergic arthritis   . Arthritis   . Asthma   . Dyslipidemia   . Family history of premature CAD    NUCLEAR STRESS TEST, 12/30/2002 - no evidence of ischemia  . History of nephrolithiasis   . Renal disorder     Patient Active Problem List   Diagnosis Date Noted  . Agatston coronary artery calcium score less than 100 02/28/2015  . Mixed hyperlipidemia 03/03/2013  . Rheumatoid arthritis ([place]) 03/03/2013  . Asthma 12/18/2011  . Nausea 12/14/2011  . Acute respiratory failure ([place]) 12/02/2011  . Headache(784.0) 12/02/2011  . Nephrolithiasis 12/01/2011  . Hydroureter 12/01/2011  . Arthritis 12/01/2011  . Pneumonitis 12/01/2011    Past Surgical History:  Procedure Laterality Date  . CYSTOSCOPY W/ URETERAL STENT PLACEMENT  12/02/2011   Procedure: CYSTOSCOPY WITH RETROGRADE PYELOGRAM/URETERAL STENT PLACEMENT;  Surgeon: Ailene Rud, MD;  Location: [facility];  Service: Urology;  Laterality: Right;  . CYSTOSCOPY W/ URETERAL STENT PLACEMENT  12/15/2011   Procedure: CYSTOSCOPY WITH STENT REPLACEMENT;  Surgeon: Molli Hazard, MD;  Location: [facility];  Service: Urology;  Laterality: Right;  right reter stent removal and stent placement  . CYSTOSCOPY/RETROGRADE/URETEROSCOPY  12/15/2011   Procedure: CYSTOSCOPY/RETROGRADE/URETEROSCOPY;  Surgeon: Molli Hazard, MD;  Location: [facility];  Service: Urology;  Laterality: Right;  no retrograde  . LIVER BIOPSY  04/2002  . TONSILLECTOMY  1970  . UPPER GI ENDOSCOPY  03/2002       Home Medications    Prior to Admission medications     Medication Sig Start Date End Date Taking? Authorizing Provider  Abatacept (ORENCIA) 125 MG/ML SOLN Inject 125 mg into the skin once a week. On sundays    [provider]  ALPRAZolam Duanne Moron) 1 MG tablet Take 1 tablet by mouth as needed. 09/20/11   [provider]  aspirin EC 81 MG tablet Take 81 mg by mouth daily.    [provider]  azelastine (ASTELIN) 137 MCG/SPRAY nasal spray Place 1 spray into the nose 2 (two) times daily. Use in each nostril as directed    [provider]  cetirizine (ZYRTEC) 10 MG tablet Take 10 mg by mouth daily.    [provider]  ezetimibe (ZETIA) 10 MG tablet TAKE 1 TABLET DAILY 07/22/17   Hilty, Nadean Corwin, MD  fenofibrate 160 MG tablet Take 160 mg by mouth daily.    [provider]  fluticasone (FLONASE) 50 MCG/ACT nasal spray Place 2 sprays into the nose daily.    [provider]  LIVALO 2 MG TABS TAKE 1 TABLET EVERY DAY 03/18/17   Hilty, Nadean Corwin, MD  montelukast (SINGULAIR) 10 MG tablet Take 1 tablet by mouth daily. 01/30/16   [provider]  Multiple Vitamin (MULITIVITAMIN WITH MINERALS) TABS Take 1 tablet by mouth daily.    [provider]  Omega-3 Fatty Acids (FISH OIL) 1200 MG CAPS Take by mouth.    [provider]  pantoprazole (PROTONIX) 40 MG tablet Take 40 mg by mouth daily.    [provider]  Potassium Citrate 15 MEQ (1620 MG) TBCR Take 15 mEq by mouth daily. 01/14/13   [provider]  ramelteon (ROZEREM) 8 MG tablet Take 8 mg by mouth daily as needed. For sleep    [provider]  triamcinolone cream (KENALOG) 0.1 % as needed. 03/14/14   [provider]  UNABLE TO FIND Med Name: allergy shot once weekly    [provider]    Family History Family History  Problem Relation Age of Onset  . Kidney Stones Mother   . Arthritis Mother 78       Osteo  . Heart disease Father 16  . Heart attack Maternal Grandfather 60  .  Hyperlipidemia Brother     Social History Social History   Tobacco Use  . Smoking status: Former Smoker    Packs/day: 0.10    Years: 4.00    Pack years: 0.40    Types: Cigarettes    Last attempt to quit: 09/17/1978    Years since quitting: 39.1  . Smokeless tobacco: Never Used  Substance Use Topics  . Alcohol use: Yes  . Drug use: No     Allergies   Mushroom extract complex; Peanut-containing drug products; Morphine and related; and Shellfish allergy   Review of Systems Review of Systems  Constitutional: Negative for chills and fever.  Respiratory: Negative for shortness of breath.   Cardiovascular: Negative for chest pain.  Gastrointestinal: Positive for abdominal pain and nausea. Negative for blood in stool, constipation, diarrhea, flatus, hematochezia, melena and vomiting.  Genitourinary: Negative for dysuria and hematuria.  Musculoskeletal: Negative for arthralgias and myalgias.  Skin: Negative for color change.  Allergic/Immunologic: Negative for immunocompromised state.  Neurological:  Negative for weakness and numbness.  Psychiatric/Behavioral: Negative for confusion.   All other systems reviewed and are negative for acute change except as noted in the HPI.    Physical Exam Updated Vital Signs BP 138/89 (BP Location: Right Arm)   Pulse 79   Temp 97.6 F (36.4 C) (Oral)   Resp 18   SpO2 100%   Physical Exam  Constitutional: He is oriented to person, place, and time. Vital signs are normal. He appears well-developed and well-nourished.  Non-toxic appearance. No distress.  Afebrile, nontoxic, NAD  HENT:  Head: Normocephalic and atraumatic.  Mouth/Throat: Oropharynx is clear and moist and mucous membranes are normal.  Eyes: Conjunctivae and EOM are normal. Right eye exhibits no discharge. Left eye exhibits no discharge.  Neck: Normal range of motion. Neck supple.  Cardiovascular: Normal rate, regular rhythm, normal heart sounds and intact distal pulses. Exam  reveals no gallop and no friction rub.  No murmur heard. Pulmonary/Chest: Effort normal and breath sounds normal. No respiratory distress. He has no decreased breath sounds. He has no wheezes. He has no rhonchi. He has no rales.  Abdominal: Soft. Normal appearance and bowel sounds are normal. He exhibits no distension. There is tenderness (mild RUQ TTP with murphy's eval). There is positive Murphy's sign (equivocal). There is no rigidity, no rebound, no guarding, no CVA tenderness and no tenderness at McBurney's point.  Soft, nondistended, +BS throughout, with no focal area of TTP until murphy's exam and then there was some mild discomfort/tenderness in the RUQ with inspiration, some mild halting of inspiratory effort during deep palpation, no r/g/r, neg mcburney's, no CVA TTP   Musculoskeletal: Normal range of motion.  Neurological: He is alert and oriented to person, place, and time. He has normal strength. No sensory deficit.  Skin: Skin is warm, dry and intact. No rash noted.  Psychiatric: He has a normal mood and affect.  Nursing note and vitals reviewed.    ED Treatments / Results  Labs (all labs ordered are listed, but only abnormal results are displayed) Labs Reviewed  COMPREHENSIVE METABOLIC PANEL - Abnormal; Notable for the following components:      Result Value   Glucose, Bld 103 (*)    Calcium 10.4 (*)    AST 211 (*)    ALT 143 (*)    Total Bilirubin 1.7 (*)    All other components within normal limits  URINALYSIS, ROUTINE W REFLEX MICROSCOPIC - Abnormal; Notable for the following components:   Specific Gravity, Urine 1.004 (*)    All other components within normal limits  LIPASE, BLOOD  CBC  HEPATITIS PANEL, ACUTE  I-STAT TROPONIN, ED    EKG  EKG Interpretation  Date/Time:  Sunday November 17 2017 14:54:34 EST Ventricular Rate:  75 PR Interval:    QRS Duration: 91 QT Interval:  387 QTC Calculation: 433 R Axis:   19 Text Interpretation:  Sinus rhythm Baseline  wander in lead(s) V1 V3 No STEMI.  Confirmed by Nanda Quinton [phone]) on 11/17/2017 4:09:20 PM       Radiology US Abdomen Complete  Result Date: 11/17/2017 CLINICAL DATA:  Right upper quadrant pain EXAM: ABDOMEN ULTRASOUND COMPLETE COMPARISON:  None. FINDINGS: Gallbladder: No gallstones or wall thickening visualized. Trace pericholecystic fluid. No sonographic Murphy sign noted by sonographer. Common bile duct: Diameter: 3.1 mm Liver: No focal lesion identified. Increased hepatic parenchymal echogenicity. Portal vein is patent on color Doppler imaging with normal direction of blood flow towards the liver. IVC: No abnormality visualized.  Pancreas: Visualized portion unremarkable. Spleen: Size and appearance within normal limits. Right Kidney: Length: 11.6 cm. 7 mm echogenic focus in the right kidney consistent with nonobstructing calculus. Echogenicity within normal limits. No mass or hydronephrosis visualized. Left Kidney: Length: 11 cm. 1.1 cm echogenic focus in the left kidney consistent with a nephrolithiasis. Echogenicity within normal limits. No mass or hydronephrosis visualized. Abdominal aorta: No aneurysm visualized. Other findings: None. IMPRESSION: 1. No cholelithiasis or sonographic evidence of acute cholecystitis. 2. Increased hepatic echogenicity as can be seen with hepatic steatosis. 3. Trace pericholecystic fluid as can be seen with hepatocellular disease. 4. Bilateral nephrolithiasis. Electronically Signed   By: Kathreen Devoid   On: 11/17/2017 16:47    Procedures Procedures (including critical care time)  Medications Ordered in ED Medications  famotidine (PEPCID) IVPB 20 mg premix (0 mg Intravenous Stopped 11/17/17 1607)  gi cocktail (Maalox,Lidocaine,Donnatal) (30 mLs Oral Given 11/17/17 1512)  ondansetron (ZOFRAN) injection 4 mg (4 mg Intravenous Given 11/17/17 1513)     Initial Impression / Assessment and Plan / ED Course  I have reviewed the triage vital signs and the nursing  notes.  Pertinent labs & imaging results that were available during my care of the patient were reviewed by me and considered in my medical decision making (see chart for details).     52 y.o. male here with epigastric and RUQ pain that began intermittently x3 days but then today has become more constant and worse. Some mild nausea as well. Feels different than prior kidney stones. On exam, no focal area of tenderness to abdomen, but some tenderness/discomfort during murphy's exam, some halting of inspiration during this portion of exam; no flank tenderness. Will get labs, EKG, trop, and abd U/S to eval for cholelithiasis vs kidney stone vs other etiology of symptoms. Will give pepcid, GI cocktail, and zofran, pt declines wanting anything for pain, will reassess shortly.   5:35 PM CBC WNL. CMP with elevated AST 211 and ALT 143 (up from prior labs, where he had just marginal elevations in these) and elevated Tbili 1.7. Lipase WNL. U/A unremarkable. Trop neg. EKG unremarkable and nonischemic. Abd U/S without cholelithiasis or evidence of cholecystitis, no common bile duct dilation, hepatic steatosis findings seen, and trace pericholecystic fluid seen likely related to hepatocellular disease, as well as b/l nonobstructing nephrolithiasis. Spoke with Dr. Havery Moros of GI, states his transaminitis is likely just related to his known fatty liver, but recommends obtaining acute hepatitis panel and have him f/up with GI for recheck of LFTs in 48hrs. Will get this drawn and reassess shortly.   6:54 PM Hepatitis panel in process. These results will not return today, will send him home and have him f/up with GI in 2 days for repeat LFTs as previously discussed. Pt feeling slightly better and tolerating PO well here. Symptoms still could be related to GERD/gastritis/PUD. Discussed diet/lifestyle modifications for symptoms, will start on zantac/zofran, advised use of low-dose tylenol and avoidance/sparing use of  NSAIDs only on full stomach, discussed other OTC remedies for symptomatic relief, and f/up with GI in 2 days for recheck of symptoms and ongoing evaluation/management. I explained the diagnosis and have given explicit precautions to return to the ER including for any other new or worsening symptoms. The patient understands and accepts the medical plan as it's been dictated and I have answered their questions. Discharge instructions concerning home care and prescriptions have been given. The patient is STABLE and is discharged to home in good condition.  Final Clinical Impressions(s) / ED Diagnoses   Final diagnoses:  Abdominal pain, acute, right upper quadrant  Nausea  Transaminitis  Hyperbilirubinemia  History of gastroesophageal reflux (GERD)    ED Discharge Orders        Ordered    ranitidine (ZANTAC) 150 MG tablet  2 times daily     11/17/17 1853    ondansetron (ZOFRAN ODT) 4 MG disintegrating tablet  Every 8 hours PRN     11/17/17 [address] [place], [place] 11/17/17 1854    Margette Fast, MD 11/17/17 2018

## 2017-11-17 NOTE — ED Notes (Signed)
Pt resting without complaints, friend at bedside. Continue to monitor and keep pt updated

## 2017-11-17 NOTE — Discharge Instructions (Signed)
Your labs today showed elevated liver function studies, your viral hepatitis panel is pending. Your ultrasound, however, was reassuring and just showed fatty liver disease which you knew about already. You will need to follow up with the gastroenterologist in 2 days for recheck of your labs and for ongoing management of this finding.  It's still possible that your abdominal pain could be from gastritis, indigestion, an ulcer, or from a viral hepatitis. You will need to take zantac as directed, continue all of your other usual home medications, and avoid spicy/fatty/acidic foods, avoid soda/coffee/tea/alcohol. Avoid laying down flat within 30 minutes of eating. Avoid NSAIDs like ibuprofen/aleve/motrin/etc on an empty stomach. May consider using over the counter tums/maalox as needed for additional relief. Use zofran as directed as needed for nausea. Use low-dose tylenol as needed for pain, but use as little as possible. Follow up with the gastroenterologist in 2 days for recheck of symptoms and ongoing management of your liver condition. Return to the ER for changes or worsening symptoms.  Abdominal (belly) pain can be caused by many things. Your caregiver performed an examination and possibly ordered blood/urine tests and imaging (CT scan, x-rays, ultrasound). Many cases can be observed and treated at home after initial evaluation in the emergency department. Even though you are being discharged home, abdominal pain can be unpredictable. Therefore, you need a repeated exam if your pain does not resolve, returns, or worsens. Most patients with abdominal pain don't have to be admitted to the hospital or have surgery, but serious problems like appendicitis and gallbladder attacks can start out as nonspecific pain. Many abdominal conditions cannot be diagnosed in one visit, so follow-up evaluations are very important. SEEK IMMEDIATE MEDICAL ATTENTION IF YOU DEVELOP ANY OF THE FOLLOWING SYMPTOMS: The pain does not  go away or becomes severe.  A temperature above 101 develops.  Repeated vomiting occurs (multiple episodes).  The pain becomes localized to portions of the abdomen. The right side could possibly be appendicitis. In an adult, the left lower portion of the abdomen could be colitis or diverticulitis.  Blood is being passed in stools or vomit (bright red or black tarry stools).  Return also if you develop chest pain, difficulty breathing, dizziness or fainting, or become confused, poorly responsive, or inconsolable (young children). The constipation stays for more than 4 days.  There is belly (abdominal) or rectal pain.  You do not seem to be getting better.

## 2017-11-17 NOTE — ED Notes (Signed)
Pt aware we are still waiting for EDP to update with POC.

## 2017-11-17 NOTE — ED Triage Notes (Signed)
Pt complains of intermittent abdominal pain x 3 days. Pt tried simethicone and antacids, states simethicone helped yesterday. Pain is worse when laying on right side. Pt has hx of GERD. Pt denies emesis, diarrhea.  BP 132/85 HR 82 RR 18 O2 96% on RA

## 2017-11-19 ENCOUNTER — Other Ambulatory Visit: Payer: Self-pay | Admitting: Physician Assistant

## 2017-11-19 DIAGNOSIS — R1011 Right upper quadrant pain: Secondary | ICD-10-CM

## 2017-11-19 DIAGNOSIS — R7989 Other specified abnormal findings of blood chemistry: Secondary | ICD-10-CM

## 2017-11-19 DIAGNOSIS — R1013 Epigastric pain: Secondary | ICD-10-CM

## 2017-11-19 DIAGNOSIS — R945 Abnormal results of liver function studies: Principal | ICD-10-CM

## 2017-11-19 LAB — HEPATITIS PANEL, ACUTE
HEP A IGM: NEGATIVE
HEP B S AG: NEGATIVE
Hep B C IgM: NEGATIVE

## 2017-11-21 ENCOUNTER — Ambulatory Visit
Admission: RE | Admit: 2017-11-21 | Discharge: 2017-11-21 | Disposition: A | Discharge status: Home / Self Care | Payer: 59 | Source: Ambulatory Visit | Attending: Physician Assistant | Admitting: Physician Assistant

## 2017-11-21 DIAGNOSIS — R7989 Other specified abnormal findings of blood chemistry: Secondary | ICD-10-CM

## 2017-11-21 DIAGNOSIS — R945 Abnormal results of liver function studies: Principal | ICD-10-CM

## 2017-11-21 DIAGNOSIS — R1011 Right upper quadrant pain: Secondary | ICD-10-CM

## 2017-11-21 DIAGNOSIS — R1013 Epigastric pain: Secondary | ICD-10-CM

## 2017-11-21 MED ORDER — GADOBENATE DIMEGLUMINE 529 MG/ML IV SOLN
15.0000 mL | Freq: Once | INTRAVENOUS | Status: AC | PRN
  Administered 2017-11-21: 15 mL via INTRAVENOUS

## 2018-02-17 ENCOUNTER — Encounter: Payer: Self-pay | Admitting: Internal Medicine

## 2018-02-17 ENCOUNTER — Ambulatory Visit: Payer: 59 | Admitting: Internal Medicine

## 2018-02-17 VITALS — BP 122/86 | HR 68 | Ht 69.0 in | Wt 173.6 lb

## 2018-02-17 DIAGNOSIS — R931 Abnormal findings on diagnostic imaging of heart and coronary circulation: Secondary | ICD-10-CM

## 2018-02-17 DIAGNOSIS — E785 Hyperlipidemia, unspecified: Secondary | ICD-10-CM | POA: Diagnosis not present

## 2018-02-17 DIAGNOSIS — M069 Rheumatoid arthritis, unspecified: Secondary | ICD-10-CM | POA: Diagnosis not present

## 2018-02-17 NOTE — Progress Notes (Signed)
OFFICE NOTE  Chief Complaint:  Annual follow-up  Primary Care Physician: Jilda Panda, MD  HPI:  Stuart Collins is a 52 year old gentleman with a history of rheumatoid arthritis, on methotrexate and Orencia. He also has dyslipidemia, for which we are following. From a cholesterol standpoint, he follows a very low-cholesterol diet; however, does have a history of steatohepatitis and is not currently on a statin, and is taking Zetia and fenofibrate for management of this. From what I could tell, his last lipid profile was in April of 2012 and would bear repeating at this time. Otherwise, he is asymptomatic. When following his lipid profile, we saw that he did have a lower LDL cholesterol when he was exercising more. Recently spent taking care of his mother who had spinal surgery and his fallen off of his exercise routine. He wishes to get back to that, to modify his risk factors for coronary disease.  [name] returns for followup today and is doing well. His cholesterol maintains good control with an LDL of 95. He is not on a statin due to a history of steatohepatitis.  He is curious as to whether or not he has any coronary artery disease at this point or not.  I saw [name] back in the office today. Overall he is doing well. He reports his arthritis is well controlled on the Orencia. He's currently on Zetia 10 mg, fenofibrate 160 and Livalo 2 mg daily. Livalo was specifically chosen because of his history of steatohepatitis. There is clear evidence that this medicine is very unlikely to cause liver enzyme abnormalities, therefore there is no generic statin alternative for this medication. He reports his exercise has been less than he would desire. Weight is fairly stable. Blood pressure is well-controlled.  03/15/2016  [name] is seen today for follow-up. Over the past year he said no new events or complaints. He reports his arthritis is fairly well-controlled. He is on that area,  fenofibrate and atorvastatin for cholesterol. He recently had lab work including a metabolic profile and lipid profile drawn by her primary care provider. I will request those records today.  03/15/2017  [name] is seen today in follow-up. He reports good control over his rheumatoid arthritis. Recently his rheumatologist retired but he's switched over to see Dr. Amil Amen. His dyslipidemia was recently assessed by his primary care provider will obtain those results to review. He is on A statin, ezetimibe and fenofibrate. His goal LDL-C is less than 70 with his history of mid LAD coronary artery calcification seen on calcium scoring. He is asymptomatic and denies any chest pain or worsening shortness of breath. He reports his liver enzymes have been stable. He exercises 2-3 times a week on a treadmill.  02/17/2018  [name] returns today for routine follow-up.  Overall he is well and denies any chest pain or worsening shortness of breath.  He continues to have some rheumatologic pain.  He is on Orencia.  He takes ezetimibe, fenofibrate and Livalo, and has a history of mild LAD coronary artery calcification.  He occasionally has some kidney stones and seems to have side effects with narcotics.  I mentioned he could consider taking Toradol for the pain.  We do not have recent lab work.  In the past his LDL had been in the low 80s.  He may benefit from a slight increase in his Livalo.  I mentioned that there is a little supportive evidence for over-the-counter fish oil, in fact it may  be harmful in some instances.  PMHx:  Past Medical History:  Diagnosis Date  . Allergic arthritis   . Arthritis   . Asthma   . Dyslipidemia   . Family history of premature CAD    NUCLEAR STRESS TEST, 12/30/2002 - no evidence of ischemia  . History of nephrolithiasis   . Renal disorder     Past Surgical History:  Procedure Laterality Date  . CYSTOSCOPY W/ URETERAL STENT PLACEMENT  12/02/2011   Procedure: CYSTOSCOPY  WITH RETROGRADE PYELOGRAM/URETERAL STENT PLACEMENT;  Surgeon: Ailene Rud, MD;  Location: [facility];  Service: Urology;  Laterality: Right;  . CYSTOSCOPY W/ URETERAL STENT PLACEMENT  12/15/2011   Procedure: CYSTOSCOPY WITH STENT REPLACEMENT;  Surgeon: Molli Hazard, MD;  Location: [facility];  Service: Urology;  Laterality: Right;  right reter stent removal and stent placement  . CYSTOSCOPY/RETROGRADE/URETEROSCOPY  12/15/2011   Procedure: CYSTOSCOPY/RETROGRADE/URETEROSCOPY;  Surgeon: Molli Hazard, MD;  Location: [facility];  Service: Urology;  Laterality: Right;  no retrograde  . LIVER BIOPSY  04/2002  . TONSILLECTOMY  1970  . UPPER GI ENDOSCOPY  03/2002    FAMHx:  Family History  Problem Relation Age of Onset  . Kidney Stones Mother   . Arthritis Mother 56       Osteo  . Heart disease Father 58  . Heart attack Maternal Grandfather 60  . Hyperlipidemia Brother     SOCHx:   reports that he quit smoking about 39 years ago. His smoking use included cigarettes. He has a 0.40 pack-year smoking history. He has never used smokeless tobacco. He reports that he drinks alcohol. He reports that he does not use drugs.  ALLERGIES:  Allergies  Allergen Reactions  . Mushroom Extract Complex Shortness Of Breath  . Peanut-Containing Drug Products Shortness Of Breath    All nuts  . Morphine And Related     REACTION: "makes my head feel like its stuffed; like it's going to pop"  . Shellfish Allergy     ROS: Pertinent items noted in HPI and remainder of comprehensive ROS otherwise negative.  HOME MEDS: Current Outpatient Medications  Medication Sig Dispense Refill  . Abatacept (ORENCIA) 125 MG/ML SOLN Inject 125 mg into the skin once a week. On sundays    . Albuterol Sulfate (PROAIR RESPICLICK) 010 (90 Base) MCG/ACT AEPB Inhale 1 puff into the lungs every 6 (six) hours as needed (wheezing).    . ALPRAZolam (XANAX) 1 MG tablet Take 1 tablet by mouth as needed for anxiety.     Marland Kitchen  aspirin EC 81 MG tablet Take 81 mg by mouth daily.    Marland Kitchen azelastine (ASTELIN) 137 MCG/SPRAY nasal spray Place 2 sprays into the nose daily. Use in each nostril as directed     . BEPREVE 1.5 % SOLN Place 1 drop into both eyes 2 (two) times daily as needed for allergies.  5  . cetirizine (ZYRTEC) 10 MG tablet Take 10 mg by mouth at bedtime.     Marland Kitchen ezetimibe (ZETIA) 10 MG tablet TAKE 1 TABLET DAILY 90 tablet 3  . fenofibrate 160 MG tablet Take 160 mg by mouth daily.    . fluticasone (FLONASE) 50 MCG/ACT nasal spray Place 2 sprays into the nose daily.    Marland Kitchen LIVALO 2 MG TABS TAKE 1 TABLET EVERY DAY 30 tablet 11  . montelukast (SINGULAIR) 10 MG tablet Take 1 tablet by mouth at bedtime.     . Multiple Vitamin (MULITIVITAMIN WITH MINERALS) TABS Take 1  tablet by mouth daily.    . Omega-3 Fatty Acids (FISH OIL) 1200 MG CAPS Take by mouth.    . pantoprazole (PROTONIX) 40 MG tablet Take 40 mg by mouth daily.    . Potassium Citrate 15 MEQ (1620 MG) TBCR Take 15 mEq by mouth daily.    . ramelteon (ROZEREM) 8 MG tablet Take 8 mg by mouth daily as needed. For sleep    . triamcinolone cream (KENALOG) 0.1 % as needed.    Marland Kitchen UNABLE TO FIND Med Name: allergy shot once weekly     No current facility-administered medications for this visit.     LABS/IMAGING: No results found for this or any previous visit (from the past 48 hour(s)). No results found.  VITALS: BP 122/86   Pulse 68   Ht 5\' 9"  (1.753 m)   Wt 173 lb 9.6 oz (78.7 kg)   BMI 25.64 kg/m   EXAM: General appearance: alert and no distress Neck: no carotid bruit, no JVD and thyroid not enlarged, symmetric, no tenderness/mass/nodules Lungs: clear to auscultation bilaterally Heart: regular rate and rhythm, S1, S2 normal, no murmur, click, rub or gallop Abdomen: soft, non-tender; bowel sounds normal; no masses,  no organomegaly Extremities: extremities normal, atraumatic, no cyanosis or edema Pulses: 2+ and symmetric Skin: Skin color, texture, turgor  normal. No rashes or lesions Neurologic: Grossly normal Psych: Pleasant  EKG: Normal sinus rhythm at 68-personally reviewed  ASSESSMENT: 1. Rheumatoid arthritis 2. Dyslipidemia 3. Elevated liver function tests - steatohepatitis 4. Coronary artery disease-mid LAD coronary calcium (CAC 65)  PLAN: 1.   Mr. Walthall continues to do well and is asymptomatic.  He denies any chest pain or worsening shortness of breath.  He needs a repeat lipid profile.  We offered that today however he wants to get it done through his primary care provider.  His goal LDL is still less than 70 and he may need further titration of his Livalo to achieve that.  His liver enzymes have remained mildly elevated secondary to steatohepatitis.  I advised that he discontinue fish oil supplements.  Follow-up with me annually or sooner as necessary.  Pixie Casino, MD, [facility], [place] Director of the Advanced Lipid Disorders &  Cardiovascular Risk Reduction Clinic Diplomate of the American Board of Clinical Lipidology Attending Cardiologist  Direct Dial: [phone]  Fax: [phone]  Website:  [email or web address] 02/17/2018, 8:05 AM

## 2018-02-17 NOTE — Patient Instructions (Addendum)
Your physician has recommended you make the following change in your medication: STOP omega-3 fish oil  Your physician recommends that you return for lab work FASTING to check cholesterol   Your physician wants you to follow-up in: ONE YEAR with Dr. Debara Pickett. You will receive a reminder letter in the mail two months in advance. If you don't receive a letter, please call our office to schedule the follow-up appointment.

## 2018-02-20 ENCOUNTER — Encounter: Payer: Self-pay | Admitting: Internal Medicine

## 2018-03-24 ENCOUNTER — Other Ambulatory Visit: Payer: Self-pay | Admitting: Internal Medicine

## 2018-03-25 NOTE — Telephone Encounter (Signed)
Rx sent to pharmacy   

## 2018-07-20 ENCOUNTER — Other Ambulatory Visit: Payer: Self-pay | Admitting: Internal Medicine

## 2018-11-03 MED ORDER — PITAVASTATIN CALCIUM 2 MG PO TABS: 1.0000 | ORAL_TABLET | Freq: Every day | ORAL | 1 refills | Status: DC

## 2019-02-25 ENCOUNTER — Telehealth: Payer: Self-pay | Admitting: Internal Medicine

## 2019-02-25 NOTE — Telephone Encounter (Signed)
smartphone/ consent/ my chart active/ pre reg completed °

## 2019-03-02 ENCOUNTER — Encounter: Payer: Self-pay | Admitting: Internal Medicine

## 2019-03-02 ENCOUNTER — Telehealth (INDEPENDENT_AMBULATORY_CARE_PROVIDER_SITE_OTHER): Payer: 59 | Admitting: Internal Medicine

## 2019-03-02 ENCOUNTER — Other Ambulatory Visit: Payer: Self-pay

## 2019-03-02 VITALS — Ht 70.0 in | Wt 175.0 lb

## 2019-03-02 DIAGNOSIS — E782 Mixed hyperlipidemia: Secondary | ICD-10-CM | POA: Diagnosis not present

## 2019-03-02 DIAGNOSIS — R931 Abnormal findings on diagnostic imaging of heart and coronary circulation: Secondary | ICD-10-CM

## 2019-03-02 DIAGNOSIS — M069 Rheumatoid arthritis, unspecified: Secondary | ICD-10-CM | POA: Diagnosis not present

## 2019-03-02 NOTE — Patient Instructions (Signed)
Medication Instructions:  Your physician recommends that you continue on your current medications as directed. Please refer to the Current Medication list given to you today.  If you need a refill on your cardiac medications before your next appointment, please call your pharmacy.    Follow-Up: At [facility], you and your health needs are our priority.  As part of our continuing mission to provide you with exceptional heart care, we have created designated Provider Care Teams.  These Care Teams include your primary Cardiologist (physician) and Advanced Practice Providers (APPs -  Physician Assistants and Nurse Practitioners) who all work together to provide you with the care you need, when you need it. You will need a follow up appointment in 12 months.  Please call our office 2 months in advance to schedule this appointment.  You may see Dr. Hilty or one of the following Advanced Practice Providers on your designated Care Team: Hao Meng, PA-C . Angela Duke, PA-C  Any Other Special Instructions Will Be Listed Below (If Applicable).    

## 2019-03-02 NOTE — Progress Notes (Signed)
Virtual Visit via Video Note   This visit type was conducted due to national recommendations for restrictions regarding the COVID-19 Pandemic (e.g. social distancing) in an effort to limit this patient's exposure and mitigate transmission in our community.  Due to his co-morbid illnesses, this patient is at least at moderate risk for complications without adequate follow up.  This format is felt to be most appropriate for this patient at this time.  All issues noted in this document were discussed and addressed.  A limited physical exam was performed with this format.  Please refer to the patient's chart for his consent to telehealth for [facility].   Evaluation Performed:  Doxy.me video visit  Date:  03/02/2019   ID:  Stuart Collins, DOB [birth year], MRN [id number]  Patient Location:  [place] [address]  Provider location:   [address], [address], [place] [address]  PCP:  Stuart Panda, MD  Cardiologist:  Stuart Casino, MD Electrophysiologist:  None   Chief Complaint:  No complaints  History of Present Illness:    Stuart Collins is a 53 y.o. male who presents via audio/video conferencing for a telehealth visit today.  [name] was seen today for a video visit.  Over the past year he is done well.  He reports good control over his rheumatoid arthritis.  He remains on Orencia.  He has dyslipidemia with mild elevation in liver enzymes due to steatohepatitis.  He is done well with Livalo, fenofibrate and ezetimibe.  He recently saw his PCP for physical and had lab work done about a week ago.  We will obtain those results today.  He denies any chest pain or worsening shortness of breath.  He has no palpitations or arrhythmias.  He has had no presyncope or syncopal symptoms.  He is bothered by some seasonal allergies which is reduced his ability to exercise outdoors.  The patient does not have symptoms concerning for COVID-19 infection (fever, chills, cough, or new  SHORTNESS OF BREATH).    Prior CV studies:   The following studies were reviewed today:  Labwork  PMHx:  Past Medical History:  Diagnosis Date  . Allergic arthritis   . Arthritis   . Asthma   . Dyslipidemia   . Family history of premature CAD    NUCLEAR STRESS TEST, 12/30/2002 - no evidence of ischemia  . History of nephrolithiasis   . Renal disorder     Past Surgical History:  Procedure Laterality Date  . CYSTOSCOPY W/ URETERAL STENT PLACEMENT  12/02/2011   Procedure: CYSTOSCOPY WITH RETROGRADE PYELOGRAM/URETERAL STENT PLACEMENT;  Surgeon: Ailene Rud, MD;  Location: [facility];  Service: Urology;  Laterality: Right;  . CYSTOSCOPY W/ URETERAL STENT PLACEMENT  12/15/2011   Procedure: CYSTOSCOPY WITH STENT REPLACEMENT;  Surgeon: Molli Hazard, MD;  Location: [facility];  Service: Urology;  Laterality: Right;  right reter stent removal and stent placement  . CYSTOSCOPY/RETROGRADE/URETEROSCOPY  12/15/2011   Procedure: CYSTOSCOPY/RETROGRADE/URETEROSCOPY;  Surgeon: Molli Hazard, MD;  Location: [facility];  Service: Urology;  Laterality: Right;  no retrograde  . LIVER BIOPSY  04/2002  . TONSILLECTOMY  1970  . UPPER GI ENDOSCOPY  03/2002    FAMHx:  Family History  Problem Relation Age of Onset  . Kidney Stones Mother   . Arthritis Mother 8       Osteo  . Heart disease Father 19  . Heart attack Maternal Grandfather 60  . Hyperlipidemia Brother  SOCHx:   reports that he quit smoking about 40 years ago. His smoking use included cigarettes. He has a 0.40 pack-year smoking history. He has never used smokeless tobacco. He reports current alcohol use. He reports that he does not use drugs.  ALLERGIES:  Allergies  Allergen Reactions  . Mushroom Extract Complex Shortness Of Breath  . Peanut-Containing Drug Products Shortness Of Breath    All nuts  . Morphine And Related     REACTION: "makes my head feel like its stuffed; like it's going to pop"  . Shellfish Allergy      MEDS:  Current Meds  Medication Sig  . Abatacept (ORENCIA) 125 MG/ML SOLN Inject 125 mg into the skin once a week. On sundays  . Albuterol Sulfate (PROAIR RESPICLICK) 102 (90 Base) MCG/ACT AEPB Inhale 1 puff into the lungs every 6 (six) hours as needed (wheezing).  . ALPRAZolam (XANAX) 1 MG tablet Take 1 tablet by mouth as needed for anxiety.   Marland Kitchen aspirin EC 81 MG tablet Take 81 mg by mouth daily.  Marland Kitchen azelastine (ASTELIN) 137 MCG/SPRAY nasal spray Place 2 sprays into the nose daily. Use in each nostril as directed   . BEPREVE 1.5 % SOLN Place 1 drop into both eyes 2 (two) times daily as needed for allergies.  . cetirizine (ZYRTEC) 10 MG tablet Take 10 mg by mouth at bedtime.   Marland Kitchen ezetimibe (ZETIA) 10 MG tablet TAKE 1 TABLET DAILY  . fenofibrate 160 MG tablet Take 160 mg by mouth daily.  . fluticasone (FLONASE) 50 MCG/ACT nasal spray Place 2 sprays into the nose daily.  . montelukast (SINGULAIR) 10 MG tablet Take 1 tablet by mouth at bedtime.   . Multiple Vitamin (MULITIVITAMIN WITH MINERALS) TABS Take 1 tablet by mouth daily.  . pantoprazole (PROTONIX) 40 MG tablet Take 40 mg by mouth daily.  . Pitavastatin Calcium (LIVALO) 2 MG TABS Take 1 tablet (2 mg total) by mouth daily.  . Potassium Citrate 15 MEQ (1620 MG) TBCR Take 15 mEq by mouth daily.  . ramelteon (ROZEREM) 8 MG tablet Take 8 mg by mouth daily as needed. For sleep  . triamcinolone cream (KENALOG) 0.1 % as needed.  Marland Kitchen UNABLE TO FIND Med Name: allergy shot once weekly     ROS: Pertinent items noted in HPI and remainder of comprehensive ROS otherwise negative.  Labs/Other Tests and Data Reviewed:    Recent Labs: No results found for requested labs within last 8760 hours.   Recent Lipid Panel Lab Results  Component Value Date/Time   CHOL 142 07/16/2014 08:01 AM   TRIG 135 07/16/2014 08:01 AM   HDL 42 07/16/2014 08:01 AM   LDLCALC 73 07/16/2014 08:01 AM    Wt Readings from Last 3 Encounters:  03/02/19 175 lb (79.4  kg)  02/17/18 173 lb 9.6 oz (78.7 kg)  03/15/17 174 lb 6.4 oz (79.1 kg)     Exam:    Vital Signs:  Ht 5\' 10"  (1.778 m)   Wt 175 lb (79.4 kg)   BMI 25.11 kg/m    General appearance: alert and no distress Lungs: No audible wheezing Abdomen: soft, non-tender; bowel sounds normal; no masses,  no organomegaly Extremities: extremities normal, atraumatic, no cyanosis or edema Skin: Skin color, texture, turgor normal. No rashes or lesions Neurologic: Mental status: Alert, oriented, thought content appropriate Psych: Pleasant  ASSESSMENT & PLAN:    1. Mixed dyslipidemia 2. Coronary artery calcification-CAC 65 3. Rheumatoid arthritis  Mr. Woolum has a mixed  dyslipidemia goal LDL less than 70 with mildly elevated coronary calcium score.  He had lab work done last week which we will obtain from his PCP and adjust medications if necessary.  He is asymptomatic.  His RA seems to be pretty well controlled.  I have encouraged more physical activity and exercise which she says is been limited due to seasonal allergies.  Plan follow-up annually or sooner as necessary.  COVID-19 Education: The signs and symptoms of COVID-19 were discussed with the patient and how to seek care for testing (follow up with PCP or arrange E-visit).  The importance of social distancing was discussed today.  Patient Risk:   After full review of this patients clinical status, I feel that they are at least moderate risk at this time.  Time:   Today, I have spent 25 minutes with the patient with telehealth technology discussing dyslipidemia, RA, CAD, seasonal allergies.     Medication Adjustments/Labs and Tests Ordered: Current medicines are reviewed at length with the patient today.  Concerns regarding medicines are outlined above.   Tests Ordered: No orders of the defined types were placed in this encounter.   Medication Changes: No orders of the defined types were placed in this encounter.   Disposition:  in 1  year(s)  Stuart Casino, MD, [facility], [place] Director of the Advanced Lipid Disorders &  Cardiovascular Risk Reduction Clinic Diplomate of the American Board of Clinical Lipidology Attending Cardiologist  Direct Dial: [phone]  Fax: [phone]  Website:  [email or web address]  Stuart Casino, MD  03/02/2019 8:46 AM

## 2019-05-10 ENCOUNTER — Other Ambulatory Visit: Payer: Self-pay | Admitting: Internal Medicine

## 2019-07-26 ENCOUNTER — Other Ambulatory Visit: Payer: Self-pay | Admitting: Internal Medicine

## 2020-01-31 ENCOUNTER — Other Ambulatory Visit: Payer: Self-pay | Admitting: Internal Medicine

## 2020-02-24 ENCOUNTER — Ambulatory Visit (INDEPENDENT_AMBULATORY_CARE_PROVIDER_SITE_OTHER): Payer: 59 | Admitting: Internal Medicine

## 2020-02-24 ENCOUNTER — Encounter: Payer: Self-pay | Admitting: Internal Medicine

## 2020-02-24 ENCOUNTER — Other Ambulatory Visit: Payer: Self-pay

## 2020-02-24 VITALS — BP 130/76 | HR 66 | Ht 69.0 in | Wt 160.6 lb

## 2020-02-24 DIAGNOSIS — E782 Mixed hyperlipidemia: Secondary | ICD-10-CM

## 2020-02-24 DIAGNOSIS — R931 Abnormal findings on diagnostic imaging of heart and coronary circulation: Secondary | ICD-10-CM | POA: Diagnosis not present

## 2020-02-24 NOTE — Patient Instructions (Signed)
Medication Instructions:  NO CHANGES  If Dr. Debara Pickett would like to change your medications based on labs from PCP, we will contact you  *If you need a refill on your cardiac medications before your next appointment, please call your pharmacy*  Follow-Up: At [facility], you and your health needs are our priority.  As part of our continuing mission to provide you with exceptional heart care, we have created designated Provider Care Teams.  These Care Teams include your primary Cardiologist (physician) and Advanced Practice Providers (APPs -  Physician Assistants and Nurse Practitioners) who all work together to provide you with the care you need, when you need it.  We recommend signing up for the patient portal called "MyChart".  Sign up information is provided on this After Visit Summary.  MyChart is used to connect with patients for Virtual Visits (Telemedicine).  Patients are able to view lab/test results, encounter notes, upcoming appointments, etc.  Non-urgent messages can be sent to your provider as well.   To learn more about what you can do with MyChart, go to [email or web address].    Your next appointment:   1 Year  The format for your next appointment:   In Person  Provider:   You may see Pixie Casino, MD or one of the following Advanced Practice Providers on your designated Care Team:    Almyra Deforest, PA-C  Fabian Sharp, PA-C or   Roby Lofts, [place]    Other Instructions

## 2020-02-24 NOTE — Progress Notes (Signed)
OFFICE NOTE  Chief Complaint:  Annual follow-up  Primary Care Physician: Jilda Panda, MD  HPI:  Stuart Collins is a 54 year old gentleman with a history of rheumatoid arthritis, on methotrexate and Orencia. He also has dyslipidemia, for which we are following. From a cholesterol standpoint, he follows a very low-cholesterol diet; however, does have a history of steatohepatitis and is not currently on a statin, and is taking Zetia and fenofibrate for management of this. From what I could tell, his last lipid profile was in April of 2012 and would bear repeating at this time. Otherwise, he is asymptomatic. When following his lipid profile, we saw that he did have a lower LDL cholesterol when he was exercising more. Recently spent taking care of his mother who had spinal surgery and his fallen off of his exercise routine. He wishes to get back to that, to modify his risk factors for coronary disease.  Stuart Collins returns for followup today and is doing well. His cholesterol maintains good control with an LDL of 95. He is not on a statin due to a history of steatohepatitis.  He is curious as to whether or not he has any coronary artery disease at this point or not.  I saw Stuart Collins back in the office today. Overall he is doing well. He reports his arthritis is well controlled on the Orencia. He's currently on Zetia 10 mg, fenofibrate 160 and Livalo 2 mg daily. Livalo was specifically chosen because of his history of steatohepatitis. There is clear evidence that this medicine is very unlikely to cause liver enzyme abnormalities, therefore there is no generic statin alternative for this medication. He reports his exercise has been less than he would desire. Weight is fairly stable. Blood pressure is well-controlled.  03/15/2016  Stuart Collins is seen today for follow-up. Over the past year he said no new events or complaints. He reports his arthritis is fairly well-controlled. He is on that area,  fenofibrate and atorvastatin for cholesterol. He recently had lab work including a metabolic profile and lipid profile drawn by her primary care provider. I will request those records today.  03/15/2017  Stuart Collins is seen today in follow-up. He reports good control over his rheumatoid arthritis. Recently his rheumatologist retired but he's switched over to see Dr. Amil Amen. His dyslipidemia was recently assessed by his primary care provider will obtain those results to review. He is on A statin, ezetimibe and fenofibrate. His goal LDL-C is less than 70 with his history of mid LAD coronary artery calcification seen on calcium scoring. He is asymptomatic and denies any chest pain or worsening shortness of breath. He reports his liver enzymes have been stable. He exercises 2-3 times a week on a treadmill.  02/17/2018  Stuart Collins returns today for routine follow-up.  Overall he is well and denies any chest pain or worsening shortness of breath.  He continues to have some rheumatologic pain.  He is on Orencia.  He takes ezetimibe, fenofibrate and Livalo, and has a history of mild LAD coronary artery calcification.  He occasionally has some kidney stones and seems to have side effects with narcotics.  I mentioned he could consider taking Toradol for the pain.  We do not have recent lab work.  In the past his LDL had been in the low 80s.  He may benefit from a slight increase in his Livalo.  I mentioned that there is a little supportive evidence for over-the-counter fish oil, in fact it may  be harmful in some instances.  02/23/2018  Stuart Collins returns today for follow-up now a few days after his birthday.  Overall he is doing very well.  He again reports good control over his rheumatoid arthritis.  He said cholesterol was just assessed yesterday and those lab results were not available.  He thinks he might have some adverse symptoms related to ezetimibe, reporting that he has had some GI upset which could be caused by  that medication.  Ultimately, we might be able to increase his Livalo further and decrease or stop the ezetimibe to see if his symptoms improve.  PMHx:  Past Medical History:  Diagnosis Date  . Allergic arthritis   . Arthritis   . Asthma   . Dyslipidemia   . Family history of premature CAD    NUCLEAR STRESS TEST, 12/30/2002 - no evidence of ischemia  . History of nephrolithiasis   . 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 Renal disorder     Past Surgical History:  Procedure Laterality Date  . CYSTOSCOPY W/ URETERAL STENT PLACEMENT  12/02/2011   Procedure: CYSTOSCOPY WITH RETROGRADE PYELOGRAM/URETERAL STENT PLACEMENT;  Surgeon: Ailene Rud, MD;  Location: [facility];  Service: Urology;  Laterality: Right;  . CYSTOSCOPY W/ URETERAL STENT PLACEMENT  12/15/2011   Procedure: CYSTOSCOPY WITH STENT REPLACEMENT;  Surgeon: Molli Hazard, MD;  Location: [facility];  Service: Urology;  Laterality: Right;  right reter stent removal and stent placement  . CYSTOSCOPY/RETROGRADE/URETEROSCOPY  12/15/2011   Procedure: CYSTOSCOPY/RETROGRADE/URETEROSCOPY;  Surgeon: Molli Hazard, MD;  Location: [facility];  Service: Urology;  Laterality: Right;  no retrograde  . LIVER BIOPSY  04/2002  . TONSILLECTOMY  1970  . UPPER GI ENDOSCOPY  03/2002    FAMHx:  Family History  Problem Relation Age of Onset  . Kidney Stones Mother   . Arthritis Mother 17       Osteo  . Heart disease Father 83  . Heart attack Maternal Grandfather 60  . Hyperlipidemia Brother     SOCHx:   reports that he quit smoking about 41 years ago. His smoking use included cigarettes. He has a 0.40 pack-year smoking history. He has never used smokeless tobacco. He reports current alcohol use. He reports that he does not use drugs.  ALLERGIES:  Allergies  Allergen Reactions  . Mushroom Extract Complex Shortness Of Breath  . Peanut-Containing Drug Products Shortness Of Breath    All nuts  .  Morphine And Related     REACTION: "makes my head feel like its stuffed; like it's going to pop"  . Shellfish Allergy     ROS: Pertinent items noted in HPI and remainder of comprehensive ROS otherwise negative.  HOME MEDS: Current Outpatient Medications  Medication Sig Dispense Refill  . Abatacept (ORENCIA) 125 MG/ML SOLN Inject 125 mg into the skin once a week. On sundays    . Albuterol Sulfate (PROAIR RESPICLICK) 370 (90 Base) MCG/ACT AEPB Inhale 1 puff into the lungs every 6 (six) hours as needed (wheezing).    . ALPRAZolam (XANAX) 1 MG tablet Take 1 tablet by mouth as needed for anxiety.     Marland Kitchen aspirin EC 81 MG tablet Take 81 mg by mouth daily.    Marland Kitchen azelastine (ASTELIN) 137 MCG/SPRAY nasal spray Place 2 sprays into the nose daily. Use in each nostril as directed     . BEPREVE 1.5 % SOLN Place 1 drop into both eyes 2 (  two) times daily as needed for allergies.  5  . cetirizine (ZYRTEC) 10 MG tablet Take 10 mg by mouth at bedtime.     Marland Kitchen ezetimibe (ZETIA) 10 MG tablet TAKE 1 TABLET DAILY 90 tablet 3  . fenofibrate 160 MG tablet Take 160 mg by mouth daily.    . fluticasone (FLONASE) 50 MCG/ACT nasal spray Place 2 sprays into the nose daily.    Marland Kitchen LIVALO 2 MG TABS TAKE 1 TABLET DAILY 90 tablet 0  . montelukast (SINGULAIR) 10 MG tablet Take 1 tablet by mouth at bedtime.     . Multiple Vitamin (MULITIVITAMIN WITH MINERALS) TABS Take 1 tablet by mouth daily.    . pantoprazole (PROTONIX) 40 MG tablet Take 40 mg by mouth daily.    . Potassium Citrate 15 MEQ (1620 MG) TBCR Take 15 mEq by mouth daily.    . ramelteon (ROZEREM) 8 MG tablet Take 8 mg by mouth daily as needed. For sleep    . triamcinolone cream (KENALOG) 0.1 % as needed.    Marland Kitchen UNABLE TO FIND Med Name: allergy shot once weekly     No current facility-administered medications for this visit.    LABS/IMAGING: No results found for this or any previous visit (from the past 48 hour(s)). No results found.  VITALS: BP 130/76   Pulse  66   Ht 5\' 9"  (1.753 m)   Wt 160 lb 9.6 oz (72.8 kg)   SpO2 100%   BMI 23.72 kg/m   EXAM: General appearance: alert and no distress Neck: no carotid bruit, no JVD and thyroid not enlarged, symmetric, no tenderness/mass/nodules Lungs: clear to auscultation bilaterally Heart: regular rate and rhythm, S1, S2 normal, no murmur, click, rub or gallop Abdomen: soft, non-tender; bowel sounds normal; no masses,  no organomegaly Extremities: extremities normal, atraumatic, no cyanosis or edema Pulses: 2+ and symmetric Skin: Skin color, texture, turgor normal. No rashes or lesions Neurologic: Grossly normal Psych: Pleasant  EKG: Normal sinus rhythm at 66 -personally reviewed  ASSESSMENT: 1. Rheumatoid arthritis 2. Dyslipidemia 3. Elevated liver function tests - steatohepatitis 4. Coronary artery disease-mid LAD coronary calcium (CAC 65)  PLAN: 1.   Stuart Collins is doing very well.  Will obtain lipids from his primary care provider and adjust his medications accordingly.  If he thinks that he is having side effects from the ezetimibe, we could consider a holiday from the medication to see if his symptoms improve.  Ultimately an option might be to increase his Livalo further however that will not totally offset stopping ezetimibe with regards to lipid reduction.  Plan follow-up with me annually or sooner as necessary.  Pixie Casino, MD, [facility], [place] Director of the Advanced Lipid Disorders &  Cardiovascular Risk Reduction Clinic Diplomate of the American Board of Clinical Lipidology Attending Cardiologist  Direct Dial: [phone]  Fax: [phone]  Website:  [email or web address] 02/24/2020, 9:01 AM

## 2020-04-17 ENCOUNTER — Other Ambulatory Visit: Payer: Self-pay | Admitting: Internal Medicine

## 2020-07-17 ENCOUNTER — Other Ambulatory Visit: Payer: Self-pay | Admitting: Internal Medicine

## 2020-07-20 ENCOUNTER — Other Ambulatory Visit: Payer: Self-pay

## 2020-07-20 ENCOUNTER — Ambulatory Visit (INDEPENDENT_AMBULATORY_CARE_PROVIDER_SITE_OTHER): Payer: 59 | Admitting: Dermatology

## 2020-07-20 ENCOUNTER — Encounter: Payer: Self-pay | Admitting: Dermatology

## 2020-07-20 DIAGNOSIS — L309 Dermatitis, unspecified: Secondary | ICD-10-CM | POA: Diagnosis not present

## 2020-07-20 DIAGNOSIS — B070 Plantar wart: Secondary | ICD-10-CM

## 2020-07-20 MED ORDER — TRIAMCINOLONE ACETONIDE 0.1 % EX CREA: TOPICAL_CREAM | Freq: Every day | CUTANEOUS | 6 refills | Status: AC

## 2020-07-20 NOTE — Patient Instructions (Signed)
Instructions for Home Use of Dichloroacetic Acid (DCA)  ° °1. Keep your bottle upright in a very safe location with the lid secure. ° °2. After bathing, apply DCA with a toothpick or a "bald" cotton applicator. Apply a thin film, wait 30 seconds/until dry and cover with waterproof tape/the "sticky" part of the band-aid. Remove the tape/band-aid after 1 to 24 hours (gradually build up duration) and repeat 1-2 times weekly. ° °3. You may need to periodically remove the thick surface skin. Do this after bathing (before applying DCA) using a pumice stone or emery board or commercial rough skin remover. (REMEMBER to not use any of these on normal skin!!) ° °4. DCA is meant only for warts on the palms, fingers, and bottoms on feet. NEVER use it on any other areas. ° °5. This is a strong medicine. There is a risk of severe burn or scar. ° °6. We will recheck your progress in 1-2 months. Call the office if there are any questions or problems. °

## 2020-07-21 ENCOUNTER — Encounter: Payer: Self-pay | Admitting: Dermatology

## 2020-07-21 NOTE — Progress Notes (Signed)
   Follow-Up Visit   Subjective  Stuart Collins is a 54 y.o. male who presents for the following: Warts (left bottom of foot x years- comes & goes tx- otc acid & duck tape- helps some / also needs refill on TAC ).  Rash and plantar wart Location:  Duration:  Quality:  Associated Signs/Symptoms: Modifying Factors:  Severity:  Timing: Context:   Objective  Well appearing patient in no apparent distress; mood and affect are within normal limits.  A focused examination was performed including Head, neck, arms, legs.. Relevant physical exam findings are noted in the Assessment and Plan.   Assessment & Plan    Plantar wart Right 2nd Metatarsal Plantar Area  Home [facility] given along with instruction sheet warning of potential scarring if applied to other areas.  Destruction of lesion - Right 2nd Metatarsal Plantar Area  Eczema, unspecified type (4) Left Forearm - Anterior; Right Forearm - Anterior; Left Lower Leg - Anterior; Right Lower Leg - Anterior  Use triamcinolone Cream as needed. Follow up if needing change in treatment.  triamcinolone cream (KENALOG) 0.1 % - Left Forearm - Anterior, Left Lower Leg - Anterior, Right Forearm - Anterior, Right Lower Leg - Anterior     I, Lavonna Monarch, MD, have reviewed all documentation for this visit.  The documentation on 07/21/20 for the exam, diagnosis, procedures, and orders are all accurate and complete.

## 2021-02-21 ENCOUNTER — Encounter: Payer: Self-pay | Admitting: Internal Medicine

## 2021-03-02 ENCOUNTER — Encounter: Payer: Self-pay | Admitting: Internal Medicine

## 2021-03-02 ENCOUNTER — Ambulatory Visit (INDEPENDENT_AMBULATORY_CARE_PROVIDER_SITE_OTHER): Payer: 59 | Admitting: Internal Medicine

## 2021-03-02 ENCOUNTER — Other Ambulatory Visit: Payer: Self-pay

## 2021-03-02 VITALS — BP 110/62 | HR 74 | Ht 69.0 in | Wt 147.4 lb

## 2021-03-02 DIAGNOSIS — R931 Abnormal findings on diagnostic imaging of heart and coronary circulation: Secondary | ICD-10-CM

## 2021-03-02 DIAGNOSIS — R748 Abnormal levels of other serum enzymes: Secondary | ICD-10-CM

## 2021-03-02 DIAGNOSIS — E785 Hyperlipidemia, unspecified: Secondary | ICD-10-CM | POA: Diagnosis not present

## 2021-03-02 NOTE — Progress Notes (Signed)
OFFICE NOTE  Chief Complaint:  Annual follow-up  Primary Care Physician: Jilda Panda, MD  HPI:  Stuart Collins is a 55 year old gentleman with a history of rheumatoid arthritis, on methotrexate and Orencia. He also has dyslipidemia, for which we are following. From a cholesterol standpoint, he follows a very low-cholesterol diet; however, does have a history of steatohepatitis and is not currently on a statin, and is taking Zetia and fenofibrate for management of this. From what I could tell, his last lipid profile was in April of 2012 and would bear repeating at this time. Otherwise, he is asymptomatic. When following his lipid profile, we saw that he did have a lower LDL cholesterol when he was exercising more. Recently spent taking care of his mother who had spinal surgery and his fallen off of his exercise routine. He wishes to get back to that, to modify his risk factors for coronary disease.  Stuart Collins returns for followup today and is doing well. His cholesterol maintains good control with an LDL of 95. He is not on a statin due to a history of steatohepatitis.  He is curious as to whether or not he has any coronary artery disease at this point or not.  I saw Stuart Collins back in the office today. Overall he is doing well. He reports his arthritis is well controlled on the Orencia. He's currently on Zetia 10 mg, fenofibrate 160 and Livalo 2 mg daily. Livalo was specifically chosen because of his history of steatohepatitis. There is clear evidence that this medicine is very unlikely to cause liver enzyme abnormalities, therefore there is no generic statin alternative for this medication. He reports his exercise has been less than he would desire. Weight is fairly stable. Blood pressure is well-controlled.  03/15/2016  Stuart Collins is seen today for follow-up. Over the past year he said no new events or complaints. He reports his arthritis is fairly well-controlled. He is on that area,  fenofibrate and atorvastatin for cholesterol. He recently had lab work including a metabolic profile and lipid profile drawn by her primary care provider. I will request those records today.  03/15/2017  Stuart Collins is seen today in follow-up. He reports good control over his rheumatoid arthritis. Recently his rheumatologist retired but he's switched over to see Dr. Amil Amen. His dyslipidemia was recently assessed by his primary care provider will obtain those results to review. He is on A statin, ezetimibe and fenofibrate. His goal LDL-C is less than 70 with his history of mid LAD coronary artery calcification seen on calcium scoring. He is asymptomatic and denies any chest pain or worsening shortness of breath. He reports his liver enzymes have been stable. He exercises 2-3 times a week on a treadmill.  02/17/2018  Stuart Collins returns today for routine follow-up.  Overall he is well and denies any chest pain or worsening shortness of breath.  He continues to have some rheumatologic pain.  He is on Orencia.  He takes ezetimibe, fenofibrate and Livalo, and has a history of mild LAD coronary artery calcification.  He occasionally has some kidney stones and seems to have side effects with narcotics.  I mentioned he could consider taking Toradol for the pain.  We do not have recent lab work.  In the past his LDL had been in the low 80s.  He may benefit from a slight increase in his Livalo.  I mentioned that there is a little supportive evidence for over-the-counter fish oil, in fact it may  be harmful in some instances.  02/23/2018  Stuart Collins returns today for follow-up now a few days after his birthday.  Overall he is doing very well.  He again reports good control over his rheumatoid arthritis.  He said cholesterol was just assessed yesterday and those lab results were not available.  He thinks he might have some adverse symptoms related to ezetimibe, reporting that he has had some GI upset which could be caused by  that medication.  Ultimately, we might be able to increase his Livalo further and decrease or stop the ezetimibe to see if his symptoms improve.  03/02/2021  Stuart Collins returns today for annual follow-up.  Overall he is doing well.  Denies any chest pain or shortness of breath.  He recently got a treadmill and intends to start using it.  He is lost a little weight.  His cholesterol looks excellent.  This is been assessed by his PCP.  Last checked about a week ago.  Total cholesterol is 127, triglycerides 123, HDL 36 and LDL of 66.  His LDL is trended downwards from 83-77 is 66.  He is now at target.  Liver enzymes are elevated which has been somewhat persistent.  ALT of 104 and AST 86, however these are less than 3 times upper limit of normal which were 55 and 46 respectively based on this lab.  He also continues to follow with rheumatology.  EKGs from Dr. Charlton Amor office shows sinus rhythm and here again also shows sinus rhythm.  Blood pressure is well controlled today at 110/62.   PMHx:  Past Medical History:  Diagnosis Date   Allergic arthritis    Arthritis    Asthma    Dyslipidemia    Family history of premature CAD    NUCLEAR STRESS TEST, 12/30/2002 - no evidence of ischemia   History of nephrolithiasis    Nodular basal cell carcinoma (BCC) 08/18/2014   right chest tx; cx3 33fu   Renal disorder     Past Surgical History:  Procedure Laterality Date   CYSTOSCOPY W/ URETERAL STENT PLACEMENT  12/02/2011   Procedure: CYSTOSCOPY WITH RETROGRADE PYELOGRAM/URETERAL STENT PLACEMENT;  Surgeon: Ailene Rud, MD;  Location: [facility];  Service: Urology;  Laterality: Right;   CYSTOSCOPY W/ URETERAL STENT PLACEMENT  12/15/2011   Procedure: CYSTOSCOPY WITH STENT REPLACEMENT;  Surgeon: Molli Hazard, MD;  Location: [facility];  Service: Urology;  Laterality: Right;  right reter stent removal and stent placement   CYSTOSCOPY/RETROGRADE/URETEROSCOPY  12/15/2011   Procedure:  CYSTOSCOPY/RETROGRADE/URETEROSCOPY;  Surgeon: Molli Hazard, MD;  Location: [facility];  Service: Urology;  Laterality: Right;  no retrograde   LIVER BIOPSY  04/2002   TONSILLECTOMY  1970   UPPER GI ENDOSCOPY  03/2002    FAMHx:  Family History  Problem Relation Age of Onset   Kidney Stones Mother    Arthritis Mother 26       Osteo   Heart disease Father 18   Heart attack Maternal Grandfather 71   Hyperlipidemia Brother     SOCHx:   reports that he quit smoking about 42 years ago. His smoking use included cigarettes. He has a 0.40 pack-year smoking history. He has never used smokeless tobacco. He reports current alcohol use. He reports that he does not use drugs.  ALLERGIES:  Allergies  Allergen Reactions   Mushroom Extract Complex Shortness Of Breath   Peanut-Containing Drug Products Shortness Of Breath    All nuts   Morphine And Related  REACTION: "makes my head feel like its stuffed; like it's going to pop"   Shellfish Allergy     ROS: Pertinent items noted in HPI and remainder of comprehensive ROS otherwise negative.  HOME MEDS: Current Outpatient Medications  Medication Sig Dispense Refill   Abatacept 125 MG/ML SOSY Inject 125 mg into the skin once a week. On sundays     Albuterol Sulfate (PROAIR RESPICLICK) 376 (90 Base) MCG/ACT AEPB Inhale 1 puff into the lungs every 6 (six) hours as needed (wheezing).     alfuzosin (UROXATRAL) 10 MG 24 hr tablet Take 10 mg by mouth daily.     ALPRAZolam (XANAX) 1 MG tablet Take 1 tablet by mouth as needed for anxiety.      aspirin 81 MG EC tablet Take 81 mg by mouth every other day.     aspirin EC 81 MG tablet Take 81 mg by mouth daily.     azelastine (ASTELIN) 137 MCG/SPRAY nasal spray Place 2 sprays into the nose daily. Use in each nostril as directed      BEPREVE 1.5 % SOLN Place 1 drop into both eyes 2 (two) times daily as needed for allergies.  5   cetirizine (ZYRTEC) 10 MG tablet Take 10 mg by mouth at bedtime.       esomeprazole (NEXIUM) 40 MG capsule Take 40 mg by mouth daily.     ezetimibe (ZETIA) 10 MG tablet TAKE 1 TABLET DAILY 90 tablet 3   famotidine (PEPCID) 20 MG tablet Take 20 mg by mouth 2 (two) times daily.     fenofibrate 160 MG tablet Take 160 mg by mouth daily.     fluticasone (FLONASE) 50 MCG/ACT nasal spray Place 2 sprays into the nose daily.     lansoprazole (PREVACID) 30 MG capsule Take 30 mg by mouth daily at 12 noon.     LIVALO 2 MG TABS TAKE 1 TABLET DAILY 90 tablet 3   montelukast (SINGULAIR) 10 MG tablet Take 1 tablet by mouth at bedtime.      Multiple Vitamin (MULITIVITAMIN WITH MINERALS) TABS Take 1 tablet by mouth daily.     Potassium Citrate 15 MEQ (1620 MG) TBCR Take 15 mEq by mouth daily.     ramelteon (ROZEREM) 8 MG tablet Take 8 mg by mouth daily as needed. For sleep     triamcinolone cream (KENALOG) 0.1 % Apply topically daily. 454 g 6   UNABLE TO FIND Med Name: allergy shot once weekly     No current facility-administered medications for this visit.    LABS/IMAGING: No results found for this or any previous visit (from the past 48 hour(s)). No results found.  VITALS: BP 110/62 (BP Location: Right Arm, Patient Position: Sitting, Cuff Size: Normal)   Pulse 74   Ht 5\' 9"  (1.753 m)   Wt 147 lb 6.4 oz (66.9 kg)   SpO2 99%   BMI 21.77 kg/m   EXAM: General appearance: alert and no distress Neck: no carotid bruit, no JVD and thyroid not enlarged, symmetric, no tenderness/mass/nodules Lungs: clear to auscultation bilaterally Heart: regular rate and rhythm, S1, S2 normal, no murmur, click, rub or gallop Abdomen: soft, non-tender; bowel sounds normal; no masses,  no organomegaly Extremities: extremities normal, atraumatic, no cyanosis or edema Pulses: 2+ and symmetric Skin: Skin color, texture, turgor normal. No rashes or lesions Neurologic: Grossly normal Psych: Pleasant  EKG: Normal sinus rhythm at 74 -personally reviewed  ASSESSMENT: Rheumatoid  arthritis Dyslipidemia Elevated liver function tests - steatohepatitis Coronary  artery disease-mid LAD coronary calcium (CAC 65)  PLAN: 1.   Stuart Collins continues to do well with mild elevation in liver enzymes.  He seems to be tolerating Livalo and ezetimibe which is keeping his LDL cholesterol now below 70.  He has lost some weight and intends to start to do more exercise.  At some point we may be able to come off of his ezetimibe.  He has no coronary symptoms.  Blood pressures well controlled.  Liver enzymes and cholesterol will be checked again by his PCP in about 6 months.  Plan follow-up with me annually or sooner as necessary.  Pixie Casino, MD, [facility], [place] Director of the Advanced Lipid Disorders &  Cardiovascular Risk Reduction Clinic Diplomate of the American Board of Clinical Lipidology Attending Cardiologist  Direct Dial: [phone]  Fax: [phone]  Website:  [email or web address] 03/02/2021, 8:25 AM

## 2021-03-02 NOTE — Patient Instructions (Signed)
Medication Instructions:  Your physician recommends that you continue on your current medications as directed. Please refer to the Current Medication list given to you today.  *If you need a refill on your cardiac medications before your next appointment, please call your pharmacy*  Follow-Up: At [facility], you and your health needs are our priority.  As part of our continuing mission to provide you with exceptional heart care, we have created designated Provider Care Teams.  These Care Teams include your primary Cardiologist (physician) and Advanced Practice Providers (APPs -  Physician Assistants and Nurse Practitioners) who all work together to provide you with the care you need, when you need it.  We recommend signing up for the patient portal called "MyChart".  Sign up information is provided on this After Visit Summary.  MyChart is used to connect with patients for Virtual Visits (Telemedicine).  Patients are able to view lab/test results, encounter notes, upcoming appointments, etc.  Non-urgent messages can be sent to your provider as well.   To learn more about what you can do with MyChart, go to [email or web address].    Your next appointment:   12 month(s)  The format for your next appointment:   In Person  Provider:   You may see Pixie Casino, MD or one of the following Advanced Practice Providers on your designated Care Team:   Almyra Deforest, PA-C Fabian Sharp, PA-C or  Roby Lofts, [place]

## 2021-03-08 ENCOUNTER — Other Ambulatory Visit: Payer: Self-pay | Admitting: Internal Medicine

## 2021-03-08 DIAGNOSIS — K760 Fatty (change of) liver, not elsewhere classified: Secondary | ICD-10-CM

## 2021-04-03 ENCOUNTER — Ambulatory Visit
Admission: RE | Admit: 2021-04-03 | Discharge: 2021-04-03 | Disposition: A | Discharge status: Home / Self Care | Payer: 59 | Source: Ambulatory Visit | Attending: Internal Medicine | Admitting: Internal Medicine

## 2021-04-03 DIAGNOSIS — K760 Fatty (change of) liver, not elsewhere classified: Secondary | ICD-10-CM

## 2021-04-19 ENCOUNTER — Encounter (HOSPITAL_BASED_OUTPATIENT_CLINIC_OR_DEPARTMENT_OTHER): Payer: Self-pay

## 2021-07-23 ENCOUNTER — Other Ambulatory Visit: Payer: Self-pay | Admitting: Internal Medicine

## 2021-11-01 NOTE — Progress Notes (Deleted)
DG foot

## 2021-11-02 ENCOUNTER — Other Ambulatory Visit: Payer: Self-pay

## 2021-11-02 ENCOUNTER — Encounter: Payer: Self-pay | Admitting: Podiatry

## 2021-11-02 ENCOUNTER — Ambulatory Visit (INDEPENDENT_AMBULATORY_CARE_PROVIDER_SITE_OTHER): Payer: 59

## 2021-11-02 ENCOUNTER — Ambulatory Visit (INDEPENDENT_AMBULATORY_CARE_PROVIDER_SITE_OTHER): Payer: 59 | Admitting: Podiatry

## 2021-11-02 DIAGNOSIS — G5781 Other specified mononeuropathies of right lower limb: Secondary | ICD-10-CM | POA: Diagnosis not present

## 2021-11-02 DIAGNOSIS — J301 Allergic rhinitis due to pollen: Secondary | ICD-10-CM | POA: Insufficient documentation

## 2021-11-02 DIAGNOSIS — Z91013 Allergy to seafood: Secondary | ICD-10-CM | POA: Insufficient documentation

## 2021-11-02 DIAGNOSIS — J3081 Allergic rhinitis due to animal (cat) (dog) hair and dander: Secondary | ICD-10-CM | POA: Insufficient documentation

## 2021-11-02 DIAGNOSIS — J4521 Mild intermittent asthma with (acute) exacerbation: Secondary | ICD-10-CM | POA: Insufficient documentation

## 2021-11-02 DIAGNOSIS — M778 Other enthesopathies, not elsewhere classified: Secondary | ICD-10-CM

## 2021-11-02 DIAGNOSIS — J309 Allergic rhinitis, unspecified: Secondary | ICD-10-CM | POA: Insufficient documentation

## 2021-11-02 DIAGNOSIS — H1045 Other chronic allergic conjunctivitis: Secondary | ICD-10-CM | POA: Insufficient documentation

## 2021-11-02 DIAGNOSIS — R898 Other abnormal findings in specimens from other organs, systems and tissues: Secondary | ICD-10-CM | POA: Insufficient documentation

## 2021-11-02 MED ORDER — TRIAMCINOLONE ACETONIDE 40 MG/ML IJ SUSP
20.0000 mg | Freq: Once | INTRAMUSCULAR | Status: AC
  Administered 2021-11-02: 20 mg

## 2021-11-04 NOTE — Progress Notes (Signed)
Subjective:  Patient ID: Stuart Collins, male    DOB: [birth year],  MRN: [id number] HPI Chief Complaint  Patient presents with   Foot Pain    Plantar forefoot right - aching, swelling x 3 months, tried Ibuprofen, active walker   New Patient (Initial Visit)    56 y.o. male presents with the above complaint.   ROS: Denies fever chills nausea vomit muscle aches pains calf pain back pain chest pain shortness of breath.  Past Medical History:  Diagnosis Date   Allergic arthritis    Arthritis    Asthma    Dyslipidemia    Family history of premature CAD    NUCLEAR STRESS TEST, 12/30/2002 - no evidence of ischemia   History of nephrolithiasis    Nodular basal cell carcinoma (BCC) 08/18/2014   right chest tx; cx3 50fu   Renal disorder    Past Surgical History:  Procedure Laterality Date   CYSTOSCOPY W/ URETERAL STENT PLACEMENT  12/02/2011   Procedure: CYSTOSCOPY WITH RETROGRADE PYELOGRAM/URETERAL STENT PLACEMENT;  Surgeon: Ailene Rud, MD;  Location: [facility];  Service: Urology;  Laterality: Right;   CYSTOSCOPY W/ URETERAL STENT PLACEMENT  12/15/2011   Procedure: CYSTOSCOPY WITH STENT REPLACEMENT;  Surgeon: Molli Hazard, MD;  Location: [facility];  Service: Urology;  Laterality: Right;  right reter stent removal and stent placement   CYSTOSCOPY/RETROGRADE/URETEROSCOPY  12/15/2011   Procedure: CYSTOSCOPY/RETROGRADE/URETEROSCOPY;  Surgeon: Molli Hazard, MD;  Location: [facility];  Service: Urology;  Laterality: Right;  no retrograde   LIVER BIOPSY  04/2002   TONSILLECTOMY  1970   UPPER GI ENDOSCOPY  03/2002    Current Outpatient Medications:    Abatacept 125 MG/ML SOSY, Inject 125 mg into the skin once a week. On sundays, Disp: , Rfl:    Albuterol Sulfate (PROAIR RESPICLICK) 903 (90 Base) MCG/ACT AEPB, Inhale 1 puff into the lungs every 6 (six) hours as needed (wheezing)., Disp: , Rfl:    alfuzosin (UROXATRAL) 10 MG 24 hr tablet, Take 10 mg by mouth daily., Disp: , Rfl:     aspirin EC 81 MG tablet, Take 81 mg by mouth daily., Disp: , Rfl:    AUVI-Q 0.3 MG/0.3ML SOAJ injection, SMARTSIG:1 IM Daily, Disp: , Rfl:    azelastine (ASTELIN) 137 MCG/SPRAY nasal spray, Place 2 sprays into the nose daily. Use in each nostril as directed , Disp: , Rfl:    BEPREVE 1.5 % SOLN, Place 1 drop into both eyes 2 (two) times daily as needed for allergies., Disp: , Rfl: 5   cetirizine (ZYRTEC) 10 MG tablet, Take 10 mg by mouth at bedtime. , Disp: , Rfl:    esomeprazole (NEXIUM) 40 MG capsule, Take 40 mg by mouth daily., Disp: , Rfl:    ezetimibe (ZETIA) 10 MG tablet, TAKE 1 TABLET DAILY, Disp: 90 tablet, Rfl: 3   famotidine (PEPCID) 20 MG tablet, Take 20 mg by mouth 2 (two) times daily., Disp: , Rfl:    fenofibrate 160 MG tablet, Take 160 mg by mouth daily., Disp: , Rfl:    fluticasone (FLONASE) 50 MCG/ACT nasal spray, Place 2 sprays into the nose daily., Disp: , Rfl:    LIVALO 2 MG TABS, TAKE 1 TABLET DAILY, Disp: 90 tablet, Rfl: 3   montelukast (SINGULAIR) 10 MG tablet, Take 1 tablet by mouth at bedtime. , Disp: , Rfl:    Multiple Vitamin (MULITIVITAMIN WITH MINERALS) TABS, Take 1 tablet by mouth daily., Disp: , Rfl:    Potassium Citrate 15 MEQ (  1620 MG) TBCR, Take 15 mEq by mouth daily., Disp: , Rfl:    ramelteon (ROZEREM) 8 MG tablet, Take 8 mg by mouth daily as needed. For sleep, Disp: , Rfl:    triamcinolone cream (KENALOG) 0.1 %, Apply topically daily., Disp: 454 g, Rfl: 6   UNABLE TO FIND, Med Name: allergy shot once weekly, Disp: , Rfl:   Allergies  Allergen Reactions   Mushroom Extract Complex Shortness Of Breath   Peanut-Containing Drug Products Shortness Of Breath    All nuts   Morphine And Related     REACTION: "makes my head feel like its stuffed; like it's going to pop"   Shellfish Allergy    Review of Systems Objective:  There were no vitals filed for this visit.  General: Well developed, nourished, in no acute distress, alert and oriented x3    Dermatological: Skin is warm, dry and supple bilateral. Nails x 10 are well maintained; remaining integument appears unremarkable at this time. There are no open sores, no preulcerative lesions, no rash or signs of infection present.  Vascular: Dorsalis Pedis artery and Posterior Tibial artery pedal pulses are 2/4 bilateral with immedate capillary fill time. Pedal hair growth present. No varicosities and no lower extremity edema present bilateral.   Neruologic: Grossly intact via light touch bilateral. Vibratory intact via tuning fork bilateral. Protective threshold with Semmes Wienstein monofilament intact to all pedal sites bilateral. Patellar and Achilles deep tendon reflexes 2+ bilateral. No Babinski or clonus noted bilateral.  Pain on palpation to the third and fourth intermetatarsal spaces of the right foot.  Moderately tender.  Palpable Mulder's click third interdigital space right foot.  Musculoskeletal: No gross boney pedal deformities bilateral. No pain, crepitus, or limitation noted with foot and ankle range of motion bilateral. Muscular strength 5/5 in all groups tested bilateral.  Gait: Unassisted, Nonantalgic.    Radiographs:  Radiographs taken today demonstrate an osseously mature individual right foot.  No spurring no soft tissue swelling mild medial deviation of the digits otherwise unremarkable radiograph.  No acute findings identified.  Assessment & Plan:   Assessment: Neuroma third interdigital space right foot.    Plan: Discussed etiology pathology conservative surgical therapies discussed appropriate shoe gear exercises ice therapy.  Discussed cutting back on activities.  Also injected the area today 10 mg Kenalog 5 mg Marcaine point maximal tenderness.     Bart Ashford T. [place], [place]

## 2021-11-30 ENCOUNTER — Ambulatory Visit: Payer: 59 | Admitting: Podiatry

## 2021-12-05 ENCOUNTER — Encounter: Payer: Self-pay | Admitting: Dermatology

## 2021-12-05 ENCOUNTER — Other Ambulatory Visit: Payer: Self-pay

## 2021-12-05 ENCOUNTER — Other Ambulatory Visit: Payer: Self-pay | Admitting: Dermatology

## 2021-12-05 ENCOUNTER — Ambulatory Visit (INDEPENDENT_AMBULATORY_CARE_PROVIDER_SITE_OTHER): Payer: 59 | Admitting: Dermatology

## 2021-12-05 DIAGNOSIS — Z85828 Personal history of other malignant neoplasm of skin: Secondary | ICD-10-CM

## 2021-12-05 DIAGNOSIS — Z1283 Encounter for screening for malignant neoplasm of skin: Secondary | ICD-10-CM | POA: Diagnosis not present

## 2021-12-05 DIAGNOSIS — L851 Acquired keratosis [keratoderma] palmaris et plantaris: Secondary | ICD-10-CM

## 2021-12-05 DIAGNOSIS — D369 Benign neoplasm, unspecified site: Secondary | ICD-10-CM

## 2021-12-05 DIAGNOSIS — C44511 Basal cell carcinoma of skin of breast: Secondary | ICD-10-CM | POA: Diagnosis not present

## 2021-12-05 DIAGNOSIS — D3611 Benign neoplasm of peripheral nerves and autonomic nervous system of face, head, and neck: Secondary | ICD-10-CM

## 2021-12-05 DIAGNOSIS — L821 Other seborrheic keratosis: Secondary | ICD-10-CM

## 2021-12-05 DIAGNOSIS — L738 Other specified follicular disorders: Secondary | ICD-10-CM | POA: Diagnosis not present

## 2021-12-05 DIAGNOSIS — D485 Neoplasm of uncertain behavior of skin: Secondary | ICD-10-CM

## 2021-12-05 DIAGNOSIS — Q825 Congenital non-neoplastic nevus: Secondary | ICD-10-CM

## 2021-12-05 NOTE — Patient Instructions (Signed)

## 2021-12-11 ENCOUNTER — Telehealth: Payer: Self-pay

## 2021-12-11 NOTE — Telephone Encounter (Signed)
-----   Message from Lavonna Monarch, MD sent at 12/08/2021  4:49 AM EDT ----- ?Schedule surgery with Dr. Darene Lamer ?

## 2021-12-11 NOTE — Telephone Encounter (Signed)
Bcc path to patient surgery made 4/27 ?

## 2021-12-23 ENCOUNTER — Encounter: Payer: Self-pay | Admitting: Dermatology

## 2021-12-23 NOTE — Progress Notes (Signed)
? ?  Follow-Up Visit ?  ?Subjective  ?Stuart Collins is a 56 y.o. male who presents for the following: Annual Exam (Full body check new lesion on the chest + flare x3 times in months personal h/o bcc mid chest tx 2015). ? ?Annual skin check, change ? ?Upper chest ?Location:  ?Duration:  ?Quality:  ?Associated Signs/Symptoms: ?Modifying Factors:  ?Severity:  ?Timing: ?Context:  ? ?Objective  ?Well appearing patient in no apparent distress; mood and affect are within normal limits. ?General examination: No atypical pigmented lesions.  1 possible nonmelanoma skin cancer right upper chest will be biopsied. ? ?Right Postauricular Area ?5 Millimeter light brown flattopped textured papule, compatible dermoscopy ? ?Head - Anterior (Face) ?Several 2 mm flesh-colored pules, some with eccentric umbilication.  Compatible dermoscopy ? ?Right Breast ?Focally eroded pearly 6 mm papule, rule out BCC ? ? ? ? ? ? ?Right Forearm - Anterior ?Flat and slightly textured lesion ? ? ? ?A full examination was performed including scalp, head, eyes, ears, nose, lips, neck, chest, axillae, abdomen, back, buttocks, bilateral upper extremities, bilateral lower extremities, hands, feet, fingers, toes, fingernails, and toenails. All findings within normal limits unless otherwise noted below. ? ? ?Assessment & Plan  ? ? ?Encounter for screening for malignant neoplasm of skin ? ?Annual skin examination encouraged to self examine twice annually continued ultraviolet protection. ? ?Seborrheic keratosis ?Right Postauricular Area ? ?Leave if stable ? ?Sebaceous gland hyperplasia ?Head - Anterior (Face) ? ?Told of similar appearance of early [facility] so if any lesion grows or bleeds return for biopsy ? 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 ?Neoplasm of uncertain behavior of skin ?Right Breast ? ?Skin / nail biopsy ?Type of biopsy: tangential   ?Informed consent: discussed and consent obtained   ?Timeout: patient name, date of birth, surgical site, and procedure  verified   ?Anesthesia: the lesion was anesthetized in a standard fashion   ?Anesthetic:  1% lidocaine w/ epinephrine 1-100,000 local infiltration ?Instrument used: flexible razor blade   ?Hemostasis achieved with: aluminum chloride and electrodesiccation   ?Outcome: patient tolerated procedure well   ?Post-procedure details: wound care instructions given   ? ?Specimen 1 - Surgical pathology ?Differential Diagnosis: bcc vs scc  ? ?Check Margins: No ? ?Stucco keratoses ?Right Forearm - Anterior ? ?No intervention necessary ? ? ? ? ? ?I, Lavonna Monarch, MD, have reviewed all documentation for this visit.  The documentation on 12/23/21 for the exam, diagnosis, procedures, and orders are all accurate and complete. ?

## 2022-01-11 ENCOUNTER — Encounter: Payer: Self-pay | Admitting: Dermatology

## 2022-01-11 ENCOUNTER — Ambulatory Visit (INDEPENDENT_AMBULATORY_CARE_PROVIDER_SITE_OTHER): Payer: 59 | Admitting: Dermatology

## 2022-01-11 DIAGNOSIS — C44511 Basal cell carcinoma of skin of breast: Secondary | ICD-10-CM | POA: Diagnosis not present

## 2022-01-11 NOTE — Patient Instructions (Signed)

## 2022-01-30 ENCOUNTER — Encounter: Payer: Self-pay | Admitting: Dermatology

## 2022-01-30 NOTE — Progress Notes (Signed)
? ?  Follow-Up Visit ?  ?Subjective  ?Stuart Collins is a 56 y.o. male who presents for the following: Procedure (Patient here today for treatment of BCC x 1 right breast). ? ?Biopsy-proven BCC right chest ?Location:  ?Duration:  ?Quality:  ?Associated Signs/Symptoms: ?Modifying Factors:  ?Severity:  ?Timing: ?Context:  ? ?Objective  ?Well appearing patient in no apparent distress; mood and affect are within normal limits. ?Right Breast ?Lesion identified by Dr.Rielly Brunn and nurse in room and patient. ? ? ? ?All skin waist up examined. ? ? ?Assessment & Plan  ? ? ?Basal cell carcinoma (BCC) of skin of right breast ?Right Breast ? ?Destruction of lesion ?Complexity: simple   ?Destruction method: electrodesiccation and curettage   ?Informed consent: discussed and consent obtained   ?Timeout:  patient name, date of birth, surgical site, and procedure verified ?Anesthesia: the lesion was anesthetized in a standard fashion   ?Anesthetic:  1% lidocaine w/ epinephrine 1-100,000 local infiltration ?Curettage performed in three different directions: Yes   ?  Electrodesiccation performed over the curetted area: No   ?Curettage cycles:  3 ?Lesion length (cm):  1 ?Lesion width (cm):  1 ?Margin per side (cm):  0 ?Final wound size (cm):  1 ?Hemostasis achieved with:  ferric subsulfate ?Outcome: patient tolerated procedure well with no complications   ?Post-procedure details: sterile dressing applied and wound care instructions given   ?Dressing type: bandage and petrolatum   ?Additional details:  Wound innoculated with 5 fluorouracil solution. ? ? ? ? ? ?I, Lavonna Monarch, MD, have reviewed all documentation for this visit.  The documentation on 01/30/22 for the exam, diagnosis, procedures, and orders are all accurate and complete. ?

## 2022-07-01 ENCOUNTER — Other Ambulatory Visit: Payer: Self-pay | Admitting: Internal Medicine

## 2022-07-10 ENCOUNTER — Other Ambulatory Visit: Payer: Self-pay | Admitting: Physician Assistant

## 2022-07-10 DIAGNOSIS — R1084 Generalized abdominal pain: Secondary | ICD-10-CM

## 2022-07-10 DIAGNOSIS — K760 Fatty (change of) liver, not elsewhere classified: Secondary | ICD-10-CM

## 2022-07-10 DIAGNOSIS — R7989 Other specified abnormal findings of blood chemistry: Secondary | ICD-10-CM

## 2022-07-12 ENCOUNTER — Other Ambulatory Visit: Payer: 59

## 2022-07-16 ENCOUNTER — Ambulatory Visit: Payer: 59 | Admitting: Dermatology

## 2022-07-25 ENCOUNTER — Ambulatory Visit
Admission: RE | Admit: 2022-07-25 | Discharge: 2022-07-25 | Disposition: A | Discharge status: Home / Self Care | Payer: 59 | Source: Ambulatory Visit | Attending: Physician Assistant | Admitting: Physician Assistant

## 2022-07-25 DIAGNOSIS — K760 Fatty (change of) liver, not elsewhere classified: Secondary | ICD-10-CM

## 2022-07-25 DIAGNOSIS — R7989 Other specified abnormal findings of blood chemistry: Secondary | ICD-10-CM

## 2022-07-25 DIAGNOSIS — R1084 Generalized abdominal pain: Secondary | ICD-10-CM

## 2022-07-25 MED ORDER — IOPAMIDOL (ISOVUE-300) INJECTION 61%
80.0000 mL | Freq: Once | INTRAVENOUS | Status: AC | PRN
  Administered 2022-07-25: 80 mL via INTRAVENOUS

## 2022-07-27 ENCOUNTER — Ambulatory Visit: Payer: 59 | Attending: Internal Medicine | Admitting: Internal Medicine

## 2022-07-27 ENCOUNTER — Encounter: Payer: Self-pay | Admitting: Internal Medicine

## 2022-07-27 VITALS — BP 126/62 | HR 83 | Ht 69.0 in | Wt 153.2 lb

## 2022-07-27 DIAGNOSIS — R748 Abnormal levels of other serum enzymes: Secondary | ICD-10-CM

## 2022-07-27 DIAGNOSIS — R931 Abnormal findings on diagnostic imaging of heart and coronary circulation: Secondary | ICD-10-CM | POA: Diagnosis not present

## 2022-07-27 DIAGNOSIS — E785 Hyperlipidemia, unspecified: Secondary | ICD-10-CM | POA: Diagnosis not present

## 2022-07-27 NOTE — Progress Notes (Signed)
OFFICE NOTE  Chief Complaint:  Annual follow-up  Primary Care Physician: Jilda Panda, MD  HPI:  Stuart Collins is a 56 year old gentleman with a history of rheumatoid arthritis, on methotrexate and Orencia. He also has dyslipidemia, for which we are following. From a cholesterol standpoint, he follows a very low-cholesterol diet; however, does have a history of steatohepatitis and is not currently on a statin, and is taking Zetia and fenofibrate for management of this. From what I could tell, his last lipid profile was in April of 2012 and would bear repeating at this time. Otherwise, he is asymptomatic. When following his lipid profile, we saw that he did have a lower LDL cholesterol when he was exercising more. Recently spent taking care of his mother who had spinal surgery and his fallen off of his exercise routine. He wishes to get back to that, to modify his risk factors for coronary disease.  Stuart Collins returns for followup today and is doing well. His cholesterol maintains good control with an LDL of 95. He is not on a statin due to a history of steatohepatitis.  He is curious as to whether or not he has any coronary artery disease at this point or not.  I saw Stuart Collins back in the office today. Overall he is doing well. He reports his arthritis is well controlled on the Orencia. He's currently on Zetia 10 mg, fenofibrate 160 and Livalo 2 mg daily. Livalo was specifically chosen because of his history of steatohepatitis. There is clear evidence that this medicine is very unlikely to cause liver enzyme abnormalities, therefore there is no generic statin alternative for this medication. He reports his exercise has been less than he would desire. Weight is fairly stable. Blood pressure is well-controlled.  03/15/2016  Stuart Collins is seen today for follow-up. Over the past year he said no new events or complaints. He reports his arthritis is fairly well-controlled. He is on that area,  fenofibrate and atorvastatin for cholesterol. He recently had lab work including a metabolic profile and lipid profile drawn by her primary care provider. I will request those records today.  03/15/2017  Stuart Collins is seen today in follow-up. He reports good control over his rheumatoid arthritis. Recently his rheumatologist retired but he's switched over to see Dr. Amil Amen. His dyslipidemia was recently assessed by his primary care provider will obtain those results to review. He is on A statin, ezetimibe and fenofibrate. His goal LDL-C is less than 70 with his history of mid LAD coronary artery calcification seen on calcium scoring. He is asymptomatic and denies any chest pain or worsening shortness of breath. He reports his liver enzymes have been stable. He exercises 2-3 times a week on a treadmill.  02/17/2018  Stuart Collins returns today for routine follow-up.  Overall he is well and denies any chest pain or worsening shortness of breath.  He continues to have some rheumatologic pain.  He is on Orencia.  He takes ezetimibe, fenofibrate and Livalo, and has a history of mild LAD coronary artery calcification.  He occasionally has some kidney stones and seems to have side effects with narcotics.  I mentioned he could consider taking Toradol for the pain.  We do not have recent lab work.  In the past his LDL had been in the low 80s.  He may benefit from a slight increase in his Livalo.  I mentioned that there is a little supportive evidence for over-the-counter fish oil, in fact it  may be harmful in some instances.  02/23/2018  Stuart Collins returns today for follow-up now a few days after his birthday.  Overall he is doing very well.  He again reports good control over his rheumatoid arthritis.  He said cholesterol was just assessed yesterday and those lab results were not available.  He thinks he might have some adverse symptoms related to ezetimibe, reporting that he has had some GI upset which could be caused by  that medication.  Ultimately, we might be able to increase his Livalo further and decrease or stop the ezetimibe to see if his symptoms improve.  03/02/2021  Stuart Collins returns today for annual follow-up.  Overall he is doing well.  Denies any chest pain or shortness of breath.  He recently got a treadmill and intends to start using it.  He is lost a little weight.  His cholesterol looks excellent.  This is been assessed by his PCP.  Last checked about a week ago.  Total cholesterol is 127, triglycerides 123, HDL 36 and LDL of 66.  His LDL is trended downwards from 83-77 is 66.  He is now at target.  Liver enzymes are elevated which has been somewhat persistent.  ALT of 104 and AST 86, however these are less than 3 times upper limit of normal which were 55 and 46 respectively based on this lab.  He also continues to follow with rheumatology.  EKGs from Dr. Charlton Amor office shows sinus rhythm and here again also shows sinus rhythm.  Blood pressure is well controlled today at 110/62.  07/27/2022  Stuart Collins is seen today in follow-up.  He tells me that recently liver enzymes have gone up somewhat.  He is followed closely at [place] but does not see a hematologist currently.  Last numbers I saw showed an AST 105 ALT 124.  He is on 3 lipid-lowering agents including Livalo 2 mg daily, fenofibrate 160 mg daily and ezetimibe 10 mg daily.  These medicines have not been adjusted.  He tells me he was taken off of the Uroxatrol.  He has not had repeat lipids recently.  His last labs in June showed total cholesterol 127, triglycerides 136, HDL 37 and LDL 68.  PMHx:  Past Medical History:  Diagnosis Date   Allergic arthritis    Arthritis    Asthma    Dyslipidemia    Family history of premature CAD    NUCLEAR STRESS TEST, 12/30/2002 - no evidence of ischemia   History of nephrolithiasis    Nodular basal cell carcinoma (BCC) 08/18/2014   right chest tx; cx3 46f   Nodular basal cell carcinoma (BCC) 12/05/2021    Right Breast (curet and 5FU)   Renal disorder     Past Surgical History:  Procedure Laterality Date   CYSTOSCOPY W/ URETERAL STENT PLACEMENT  12/02/2011   Procedure: CYSTOSCOPY WITH RETROGRADE PYELOGRAM/URETERAL STENT PLACEMENT;  Surgeon: SAilene Rud MD;  [facility];  Service: Urology;  Laterality: Right;   CYSTOSCOPY W/ URETERAL STENT PLACEMENT  12/15/2011   Procedure: CYSTOSCOPY WITH STENT REPLACEMENT;  Surgeon: DMolli Hazard MD;  Location[facility];  Service: Urology;  Laterality: Right;  right reter stent removal and stent placement   CYSTOSCOPY/RETROGRADE/URETEROSCOPY  12/15/2011   Procedure: CYSTOSCOPY/RETROGRADE/URETEROSCOPY;  Surgeon: DMolli Hazard MD;  [facility];  Service: Urology;  Laterality: Right;  no retrograde   LIVER BIOPSY  04/2002   TONSILLECTOMY  1970   UPPER GI ENDOSCOPY  03/2002  FAMHx:  Family History  Problem Relation Age of Onset   Kidney Stones Mother    Arthritis Mother 50       Osteo   Heart disease Father 77   Heart attack Maternal Grandfather 60   Hyperlipidemia Brother     SOCHx:   reports that he quit smoking about 43 years ago. His smoking use included cigarettes. He has a 0.40 pack-year smoking history. He has never used smokeless tobacco. He reports current alcohol use. He reports that he does not use drugs.  ALLERGIES:  Allergies  Allergen Reactions   Mushroom Extract Complex Shortness Of Breath   Peanut-Containing Drug Products Shortness Of Breath    All nuts   Morphine And Related     REACTION: "makes my head feel like its stuffed; like it's going to pop"   Shellfish Allergy     ROS: Pertinent items noted in HPI and remainder of comprehensive ROS otherwise negative.  HOME MEDS: Current Outpatient Medications  Medication Sig Dispense Refill   Abatacept 125 MG/ML SOSY Inject 125 mg into the skin once a week. On sundays     Albuterol Sulfate (PROAIR RESPICLICK) 947 (90 Base) MCG/ACT AEPB Inhale 1  puff into the lungs every 6 (six) hours as needed (wheezing).     alfuzosin (UROXATRAL) 10 MG 24 hr tablet Take 10 mg by mouth daily.     aspirin EC 81 MG tablet Take 81 mg by mouth daily.     AUVI-Q 0.3 MG/0.3ML SOAJ injection SMARTSIG:1 IM Daily     azelastine (ASTELIN) 137 MCG/SPRAY nasal spray Place 2 sprays into the nose daily. Use in each nostril as directed      BEPREVE 1.5 % SOLN Place 1 drop into both eyes 2 (two) times daily as needed for allergies.  5   cetirizine (ZYRTEC) 10 MG tablet Take 10 mg by mouth at bedtime.      esomeprazole (NEXIUM) 40 MG capsule Take 40 mg by mouth daily.     ezetimibe (ZETIA) 10 MG tablet Take 1 tablet (10 mg total) by mouth daily. Keep upcoming appointment for future refills. 30 tablet 0   famotidine (PEPCID) 20 MG tablet Take 20 mg by mouth 2 (two) times daily.     fenofibrate 160 MG tablet Take 160 mg by mouth daily.     fluticasone (FLONASE) 50 MCG/ACT nasal spray Place 2 sprays into the nose daily.     montelukast (SINGULAIR) 10 MG tablet Take 1 tablet by mouth at bedtime.      Multiple Vitamin (MULITIVITAMIN WITH MINERALS) TABS Take 1 tablet by mouth daily.     Pitavastatin Calcium (LIVALO) 2 MG TABS Take 1 tablet (2 mg total) by mouth daily. KEEP UPCOMING OFFICE VISIT FOR FUTURE REFILLS. 30 tablet 0   Potassium Citrate 15 MEQ (1620 MG) TBCR Take 15 mEq by mouth daily.     ramelteon (ROZEREM) 8 MG tablet Take 8 mg by mouth daily as needed. For sleep     sucralfate (CARAFATE) 1 g tablet Take 1 g by mouth 4 (four) times daily.     triamcinolone cream (KENALOG) 0.1 % Apply topically daily. 454 g 6   UNABLE TO FIND Med Name: allergy shot once weekly     No current facility-administered medications for this visit.    LABS/IMAGING: No results found for this or any previous visit (from the past 48 hour(s)). No results found.  VITALS: BP 126/62   Pulse 83   Ht '5\' 9"'$  (1.753  m)   Wt 153 lb 3.2 oz (69.5 kg)   SpO2 98%   BMI 22.62 kg/m    EXAM: General appearance: alert and no distress Neck: no carotid bruit, no JVD and thyroid not enlarged, symmetric, no tenderness/mass/nodules Lungs: clear to auscultation bilaterally Heart: regular rate and rhythm, S1, S2 normal, no murmur, click, rub or gallop Abdomen: soft, non-tender; bowel sounds normal; no masses,  no organomegaly Extremities: extremities normal, atraumatic, no cyanosis or edema Pulses: 2+ and symmetric Skin: Skin color, texture, turgor normal. No rashes or lesions Neurologic: Grossly normal Psych: Pleasant  EKG: Sinus rhythm at 83-personally reviewed  ASSESSMENT: Rheumatoid arthritis Dyslipidemia Elevated liver function tests - steatohepatitis Coronary artery disease-mid LAD coronary calcium (CAC 65)  PLAN: 1.   Stuart Collins reports he has had some recent increases in his liver enzymes that said about 3 times upper limit of normal.  His lipids have been pretty well controlled.  I am concerned about his numbers if they continue to go up that we may need to back off on some of his therapies.  I would like to repeat a fasting lipid profile and liver enzymes as well and we may consider backing off on therapy especially if his numbers are well controlled.  Plan otherwise follow-up in 6 months or sooner as necessary.  Pixie Casino, MD, [facility], [place] Director of the Advanced Lipid Disorders &  Cardiovascular Risk Reduction Clinic Diplomate of the American Board of Clinical Lipidology Attending Cardiologist  Direct Dial: [phone]  Fax: [phone]  Website:  [email or web address] 07/27/2022, 8:15 AM

## 2022-07-27 NOTE — Patient Instructions (Signed)
Medication Instructions:  Your physician recommends that you continue on your current medications as directed. Please refer to the Current Medication list given to you today.   Labwork: Liver Enzyme Panel Fasting Lipid Panel- Nothing to eat 6 hour prior to having labs drawn  Testing/Procedures: None  Follow-Up: Follow up with Dr. Debara Pickett in 6 months.   Any Other Special Instructions Will Be Listed Below (If Applicable).     If you need a refill on your cardiac medications before your next appointment, please call your pharmacy.

## 2022-07-31 ENCOUNTER — Other Ambulatory Visit: Payer: Self-pay | Admitting: *Deleted

## 2022-07-31 DIAGNOSIS — E785 Hyperlipidemia, unspecified: Secondary | ICD-10-CM

## 2022-07-31 DIAGNOSIS — R748 Abnormal levels of other serum enzymes: Secondary | ICD-10-CM

## 2022-07-31 LAB — LIPID PANEL
Chol/HDL Ratio: 4.2 ratio (ref 0.0–5.0)
Cholesterol, Total: 129 mg/dL (ref 100–199)
HDL: 31 mg/dL — ABNORMAL LOW (ref >39)
LDL Chol Calc (NIH): 74 mg/dL (ref 0–99)
Triglycerides: 133 mg/dL (ref 0–149)
VLDL Cholesterol Cal: 24 mg/dL (ref 5–40)

## 2022-07-31 LAB — HEPATIC FUNCTION PANEL
ALT: 157 IU/L — ABNORMAL HIGH (ref 0–44)
AST: 196 IU/L — ABNORMAL HIGH (ref 0–40)
Albumin: 3.7 g/dL — ABNORMAL LOW (ref 3.8–4.9)
Alkaline Phosphatase: 56 IU/L (ref 44–121)
Bilirubin Total: 0.3 mg/dL (ref 0.0–1.2)
Bilirubin, Direct: 0.15 mg/dL (ref 0.00–0.40)
Total Protein: 6.6 g/dL (ref 6.0–8.5)

## 2022-08-20 ENCOUNTER — Other Ambulatory Visit: Payer: 59

## 2022-09-17 ENCOUNTER — Encounter: Payer: Self-pay | Admitting: Internal Medicine

## 2022-09-18 MED ORDER — PITAVASTATIN CALCIUM 2 MG PO TABS: 1.0000 | ORAL_TABLET | Freq: Every day | ORAL | 1 refills | Status: DC

## 2022-12-21 LAB — LIPID PANEL
Chol/HDL Ratio: 6.6 ratio — ABNORMAL HIGH (ref 0.0–5.0)
Cholesterol, Total: 165 mg/dL (ref 100–199)
HDL: 25 mg/dL — ABNORMAL LOW (ref >39)
LDL Chol Calc (NIH): 101 mg/dL — ABNORMAL HIGH (ref 0–99)
Triglycerides: 228 mg/dL — ABNORMAL HIGH (ref 0–149)
VLDL Cholesterol Cal: 39 mg/dL (ref 5–40)

## 2022-12-21 LAB — HEPATIC FUNCTION PANEL
ALT: 210 IU/L — ABNORMAL HIGH (ref 0–44)
AST: 336 IU/L — ABNORMAL HIGH (ref 0–40)
Albumin: 3.5 g/dL — ABNORMAL LOW (ref 3.8–4.9)
Alkaline Phosphatase: 64 IU/L (ref 44–121)
Bilirubin Total: 0.4 mg/dL (ref 0.0–1.2)
Bilirubin, Direct: 0.18 mg/dL (ref 0.00–0.40)
Total Protein: 6.2 g/dL (ref 6.0–8.5)

## 2022-12-24 ENCOUNTER — Other Ambulatory Visit: Payer: Self-pay | Admitting: *Deleted

## 2022-12-24 DIAGNOSIS — R748 Abnormal levels of other serum enzymes: Secondary | ICD-10-CM

## 2022-12-25 ENCOUNTER — Encounter: Payer: Self-pay | Admitting: Internal Medicine

## 2023-01-08 ENCOUNTER — Other Ambulatory Visit: Payer: Self-pay | Admitting: Internal Medicine

## 2023-01-08 DIAGNOSIS — M3320 Polymyositis, organ involvement unspecified: Secondary | ICD-10-CM

## 2023-01-10 ENCOUNTER — Encounter: Payer: Self-pay | Admitting: Internal Medicine

## 2023-01-11 ENCOUNTER — Ambulatory Visit
Admission: RE | Admit: 2023-01-11 | Discharge: 2023-01-11 | Disposition: A | Discharge status: Home / Self Care | Payer: 59 | Source: Ambulatory Visit | Attending: Internal Medicine | Admitting: Internal Medicine

## 2023-01-11 DIAGNOSIS — M3320 Polymyositis, organ involvement unspecified: Secondary | ICD-10-CM

## 2023-01-28 ENCOUNTER — Encounter (HOSPITAL_BASED_OUTPATIENT_CLINIC_OR_DEPARTMENT_OTHER): Payer: Self-pay

## 2023-01-28 ENCOUNTER — Other Ambulatory Visit: Payer: Self-pay

## 2023-01-28 ENCOUNTER — Emergency Department (HOSPITAL_BASED_OUTPATIENT_CLINIC_OR_DEPARTMENT_OTHER)
Admission: EM | Admit: 2023-01-28 | Discharge: 2023-01-28 | Disposition: A | Discharge status: Home / Self Care | Payer: 59 | Attending: Emergency Medicine | Admitting: Emergency Medicine

## 2023-01-28 ENCOUNTER — Emergency Department (HOSPITAL_BASED_OUTPATIENT_CLINIC_OR_DEPARTMENT_OTHER): Payer: 59

## 2023-01-28 DIAGNOSIS — R1032 Left lower quadrant pain: Secondary | ICD-10-CM | POA: Insufficient documentation

## 2023-01-28 DIAGNOSIS — J45909 Unspecified asthma, uncomplicated: Secondary | ICD-10-CM | POA: Diagnosis not present

## 2023-01-28 DIAGNOSIS — K529 Noninfective gastroenteritis and colitis, unspecified: Secondary | ICD-10-CM

## 2023-01-28 DIAGNOSIS — R197 Diarrhea, unspecified: Secondary | ICD-10-CM | POA: Insufficient documentation

## 2023-01-28 DIAGNOSIS — E871 Hypo-osmolality and hyponatremia: Secondary | ICD-10-CM | POA: Diagnosis not present

## 2023-01-28 DIAGNOSIS — Z7982 Long term (current) use of aspirin: Secondary | ICD-10-CM | POA: Insufficient documentation

## 2023-01-28 DIAGNOSIS — Z91010 Allergy to peanuts: Secondary | ICD-10-CM | POA: Diagnosis not present

## 2023-01-28 LAB — COMPREHENSIVE METABOLIC PANEL
ALT: 225 U/L — ABNORMAL HIGH (ref 0–44)
AST: 336 U/L — ABNORMAL HIGH (ref 15–41)
Albumin: 2.7 g/dL — ABNORMAL LOW (ref 3.5–5.0)
Alkaline Phosphatase: 58 U/L (ref 38–126)
Anion gap: 6 (ref 5–15)
BUN: 19 mg/dL (ref 6–20)
CO2: 23 mmol/L (ref 22–32)
Calcium: 8.9 mg/dL (ref 8.9–10.3)
Chloride: 94 mmol/L — ABNORMAL LOW (ref 98–111)
Creatinine, Ser: 0.77 mg/dL (ref 0.61–1.24)
GFR, Estimated: >60 mL/min (ref >60)
Glucose, Bld: 73 mg/dL (ref 70–99)
Potassium: 3.8 mmol/L (ref 3.5–5.1)
Sodium: 123 mmol/L — ABNORMAL LOW (ref 135–145)
Total Bilirubin: 0.7 mg/dL (ref 0.3–1.2)
Total Protein: 5.9 g/dL — ABNORMAL LOW (ref 6.5–8.1)

## 2023-01-28 LAB — URINALYSIS, ROUTINE W REFLEX MICROSCOPIC
Bilirubin Urine: NEGATIVE
Glucose, UA: NEGATIVE mg/dL
Ketones, ur: NEGATIVE mg/dL
Leukocytes,Ua: NEGATIVE
Nitrite: NEGATIVE
Specific Gravity, Urine: 1.013 (ref 1.005–1.030)
pH: 6.5 (ref 5.0–8.0)

## 2023-01-28 LAB — CBC
HCT: 36.4 % — ABNORMAL LOW (ref 39.0–52.0)
Hemoglobin: 12.2 g/dL — ABNORMAL LOW (ref 13.0–17.0)
MCH: 30.3 pg (ref 26.0–34.0)
MCHC: 33.5 g/dL (ref 30.0–36.0)
MCV: 90.5 fL (ref 80.0–100.0)
Platelets: 190 10*3/uL (ref 150–400)
RBC: 4.02 MIL/uL — ABNORMAL LOW (ref 4.22–5.81)
RDW: 14.3 % (ref 11.5–15.5)
WBC: 4.2 10*3/uL (ref 4.0–10.5)
nRBC: 0 % (ref 0.0–0.2)

## 2023-01-28 LAB — LIPASE, BLOOD: Lipase: 64 U/L — ABNORMAL HIGH (ref 11–51)

## 2023-01-28 MED ORDER — ONDANSETRON HCL 4 MG/2ML IJ SOLN
4.0000 mg | Freq: Once | INTRAMUSCULAR | Status: AC
  Administered 2023-01-28: 4 mg via INTRAVENOUS
  Filled 2023-01-28: qty 2

## 2023-01-28 MED ORDER — KETOROLAC TROMETHAMINE 30 MG/ML IJ SOLN
30.0000 mg | Freq: Once | INTRAMUSCULAR | Status: AC
  Administered 2023-01-28: 30 mg via INTRAVENOUS
  Filled 2023-01-28: qty 1

## 2023-01-28 MED ORDER — SODIUM CHLORIDE 0.9 % IV BOLUS
1000.0000 mL | Freq: Once | INTRAVENOUS | Status: AC
  Administered 2023-01-28: 1000 mL via INTRAVENOUS

## 2023-01-28 MED ORDER — IOHEXOL 300 MG/ML  SOLN
100.0000 mL | Freq: Once | INTRAMUSCULAR | Status: AC | PRN
  Administered 2023-01-28: 85 mL via INTRAVENOUS

## 2023-01-28 MED ORDER — METRONIDAZOLE 500 MG PO TABS: 500.0000 mg | ORAL_TABLET | Freq: Two times a day (BID) | ORAL | 0 refills | Status: DC

## 2023-01-28 NOTE — Discharge Instructions (Addendum)
If your symptoms have not improved in the next 2 days, fill the prescription for Flagyl you have been provided with today.  Follow-up with your primary doctor for a repeat of your sodium level in the next few days.  Return to the emergency department for severe abdominal pain, bloody stools, high fevers, or for other new and concerning symptoms.

## 2023-01-28 NOTE — ED Provider Notes (Signed)
[name] EMERGENCY DEPARTMENT AT [facility] Provider Note   CSN: [id number] Arrival date & time: 01/28/23  0232     History  Chief Complaint  Patient presents with   Abdominal Pain    Stuart Collins is a 57 y.o. male.  Patient is a 57 year old male with past medical history of rheumatoid arthritis, asthma, kidney stones, hyperlipidemia.  Patient presenting today with complaints of diarrhea and abdominal pain.  This has been ongoing for the past 3 days.  He describes several episodes per day of crampy left lower abdominal pain and loose stools.  All have been nonbloody.  He has felt nauseated, but has not vomited.  He denies fevers or chills.  He reports being seen by his urologist 2 days ago who did an x-ray, however no kidney stones were identified.  He denies any fevers or chills.  He denies any ill contacts.  He denies having consumed any undercooked or suspicious foods.  The history is provided by the patient.       Home Medications Prior to Admission medications   Medication Sig Start Date End Date Taking? Authorizing Provider  Abatacept 125 MG/ML SOSY Inject 125 mg into the skin once a week. On sundays    [provider]  Albuterol Sulfate (PROAIR RESPICLICK) 108 (90 Base) MCG/ACT AEPB Inhale 1 puff into the lungs every 6 (six) hours as needed (wheezing).    [provider]  alfuzosin (UROXATRAL) 10 MG 24 hr tablet Take 10 mg by mouth daily. 12/27/20   [provider]  aspirin EC 81 MG tablet Take 81 mg by mouth daily.    [provider]  AUVI-Q 0.3 MG/0.3ML SOAJ injection SMARTSIG:1 IM Daily 09/28/21   [provider]  azelastine (ASTELIN) 137 MCG/SPRAY nasal spray Place 2 sprays into the nose daily. Use in each nostril as directed     [provider]  BEPREVE 1.5 % SOLN Place 1 drop into both eyes 2 (two) times daily as needed for allergies. 10/25/17   [provider]  cetirizine (ZYRTEC) 10 MG tablet Take  10 mg by mouth at bedtime.     [provider]  esomeprazole (NEXIUM) 40 MG capsule Take 40 mg by mouth daily.    [provider]  famotidine (PEPCID) 20 MG tablet Take 20 mg by mouth 2 (two) times daily.    [provider]  fenofibrate 160 MG tablet Take 160 mg by mouth daily.    [provider]  fluticasone (FLONASE) 50 MCG/ACT nasal spray Place 2 sprays into the nose daily.    [provider]  montelukast (SINGULAIR) 10 MG tablet Take 1 tablet by mouth at bedtime.  01/30/16   [provider]  Multiple Vitamin (MULITIVITAMIN WITH MINERALS) TABS Take 1 tablet by mouth daily.    [provider]  Potassium Citrate 15 MEQ (1620 MG) TBCR Take 15 mEq by mouth daily. 01/14/13   [provider]  ramelteon (ROZEREM) 8 MG tablet Take 8 mg by mouth daily as needed. For sleep    [provider]  sucralfate (CARAFATE) 1 g tablet Take 1 g by mouth 4 (four) times daily. 11/14/21   [provider]  triamcinolone cream (KENALOG) 0.1 % Apply topically daily. 07/20/20   Janalyn Harder, MD  UNABLE TO FIND Med Name: allergy shot once weekly    [provider]      Allergies    Mushroom extract complex, Peanut-containing drug products, Morphine and related,  and Shellfish allergy    Review of Systems   Review of Systems  All other systems reviewed and are negative.   Physical Exam Updated Vital Signs BP (!) 145/84 (BP Location: Right Arm)   Pulse 91   Temp 97.7 F (36.5 C) (Oral)   Resp 18   Ht 5\' 9"  (1.753 m)   Wt 72.6 kg   SpO2 100%   BMI 23.63 kg/m  Physical Exam Vitals and nursing note reviewed.  Constitutional:      General: He is not in acute distress.    Appearance: He is well-developed. He is not diaphoretic.  HENT:     Head: Normocephalic and atraumatic.  Cardiovascular:     Rate and Rhythm: Normal rate and regular rhythm.     Heart sounds: No murmur heard.    No friction rub.  Pulmonary:      Effort: Pulmonary effort is normal. No respiratory distress.     Breath sounds: Normal breath sounds. No wheezing or rales.  Abdominal:     General: Bowel sounds are normal. There is no distension.     Palpations: Abdomen is soft.     Tenderness: There is abdominal tenderness in the left lower quadrant. There is no right CVA tenderness, left CVA tenderness, guarding or rebound.  Musculoskeletal:        General: Normal range of motion.     Cervical back: Normal range of motion and neck supple.  Skin:    General: Skin is warm and dry.  Neurological:     Mental Status: He is alert and oriented to person, place, and time.     Coordination: Coordination normal.     ED Results / Procedures / Treatments   Labs (all labs ordered are listed, but only abnormal results are displayed) Labs Reviewed  LIPASE, BLOOD  COMPREHENSIVE METABOLIC PANEL  CBC  URINALYSIS, ROUTINE W REFLEX MICROSCOPIC    EKG None  Radiology No results found.  Procedures Procedures    Medications Ordered in ED Medications  ondansetron (ZOFRAN) injection 4 mg (has no administration in time range)  ketorolac (TORADOL) 30 MG/ML injection 30 mg (has no administration in time range)  sodium chloride 0.9 % bolus 1,000 mL (has no administration in time range)    ED Course/ Medical Decision Making/ A&P  Patient is a 57 year old male with past medical history as per HPI presenting with complaints of diarrhea and left lower quadrant pain that has been intermittent for the past several days.  Patient arrives here with stable vital signs and physical examination which is basically unremarkable except for mild tenderness to the left lower quadrant.  Workup initiated including CBC, CMP, and lipase.  His lipase is mildly elevated at 64 with mild elevations of the AST and ALT as well.  His liver enzymes have been chronically elevated, thought to be related to his cholesterol medicine or possibly myositis.  He is  currently undergoing workup for the myositis.  He also has a sodium of 123.  He does report drinking increased water and fluids over the past several days to stay hydrated and I suspect that is the cause.  He was hydrated with normal saline here and given Zofran for nausea and Toradol for discomfort.  CT scan of the abdomen and pelvis obtained showing no acute intra-abdominal pathology.  At this time, because of the diarrhea unclear, however I suspect a viral process.  I will have patient wait out the symptoms for 1 more day.  If  he is not improving I will provide a prescription for Flagyl which he can fill.  To follow-up as needed.  Final Clinical Impression(s) / ED Diagnoses Final diagnoses:  None    Rx / DC Orders ED Discharge Orders     None         Geoffery Lyons, MD 01/28/23 [phone]

## 2023-01-28 NOTE — ED Triage Notes (Signed)
Pt reports LLQ pain that began Thursday and went to see Urology on Friday to r/o kidney stone. Not kidney stone. Pt reports ongoing worsening LLQ pain. No hematuria; no fevers.

## 2023-01-30 ENCOUNTER — Ambulatory Visit: Payer: Self-pay | Admitting: Surgery

## 2023-01-30 NOTE — H&P (Addendum)
History of Present Illness: Stuart Collins is a 57 y.o. male who was referred to me for evaluation of a muscle biopsy. He has developed proximal muscle weakness starting last fall, and was found to have an elevated CK of 2268. He was on statin therapy, which was discontinued, and CK improved to 1300. However his symptoms have persisted, and have especially worsened since January. He now has difficulty standing up and uses a cane for assistance .He had a bilateral thigh MRI on 4/26, which showed muscular edema in the thigh muscles, suspicious for myositis. He has been referred for a muscle biopsy.   He takes a baby aspirin daily but no other blood thinners.     Review of Systems: A complete review of systems was obtained from the patient.  I have reviewed this information and discussed as appropriate with the patient.  See HPI as well for other ROS.       Medical History: Past Medical History Past Medical History: Diagnosis Date  Arthritis    Asthma, unspecified asthma severity, unspecified whether complicated, unspecified whether persistent (HHS-HCC)    GERD (gastroesophageal reflux disease)    Hyperlipidemia    Liver disease         Problem List There is no problem list on file for this patient.     Past Surgical History Past Surgical History: Procedure Laterality Date  TONSILLECTOMY   1970  TWISTED TESTICLES    1970      Allergies Allergies Allergen Reactions  Mushroom Shortness Of Breath  Peanut Shortness Of Breath     All nuts  Morphine Other (See Comments)     REACTION: "makes my head feel like its stuffed; like it's going to pop"  Shellfish Containing Products Hives  Codeine Other (See Comments)     REACTION: "makes my head feel like its stuffed; like it's going to pop"      Medications Ordered Prior to Encounter Current Outpatient Medications on File Prior to Visit Medication Sig Dispense Refill  abatacept (ORENCIA) 125 mg/mL injection syringe Inject  subcutaneously      aspirin 81 MG EC tablet Take 81 mg by mouth once daily      azelastine (ASTELIN) 137 mcg nasal spray Place into one nostril      cetirizine (ZYRTEC) 10 MG tablet Take 10 mg by mouth at bedtime      fenofibrate 160 MG tablet Take 160 mg by mouth once daily      fluticasone furoate 50 mcg/actuation DsDv        lansoprazole (PREVACID) 30 MG DR capsule 1 capsule before a meal Orally Once a day for 90 days      montelukast (SINGULAIR) 10 mg tablet        mv-min-folic-K1-lycopen-lutein (CENTRUM MINIS MEN 50 PLUS) 150-30-300-150 mcg Tab        potassium citrate (UROCIT-K) 15 mEq ER tablet        sucralfate (CARAFATE) 1 gram tablet TAKE 1 TABLET BY MOUTH TWICE A DAY ON AN EMPTY STOMACH        No current facility-administered medications on file prior to visit.      Family History History reviewed. No pertinent family history.     Tobacco Use History Social History    Tobacco Use Smoking Status Former  Types: Cigarettes  Start date: 1985 Smokeless Tobacco Never      Social History Social History    Socioeconomic History  Marital status: Single Tobacco Use  Smoking status: Former  Types: Cigarettes     Start date: 1985  Smokeless tobacco: Never Substance and Sexual Activity  Alcohol use: Not Currently  Drug use: Never    Social Determinants of Health      Received from [facility]   Social Network      Objective:     Vitals:   01/30/23 1326 BP: 134/78 Pulse: 82 Temp: 36.7 C (98 F) SpO2: 99% Weight: 73.5 kg (162 lb) Height: 175.3 cm (5\' 9" ) PainSc:   2 PainLoc: Leg   Body mass index is 23.92 kg/m.   Physical Exam Vitals reviewed.  Constitutional:      General: He is not in acute distress.    Appearance: Normal appearance.  HENT:     Head: Normocephalic and atraumatic.  Cardiovascular:     Rate and Rhythm: Normal rate and regular rhythm.     Heart sounds: No murmur heard. Pulmonary:     Effort: Pulmonary effort is  normal. No respiratory distress.     Breath sounds: Normal breath sounds. No wheezing.  Musculoskeletal:        General: No deformity.  Skin:    General: Skin is warm and dry.     Coloration: Skin is not jaundiced.  Neurological:     General: No focal deficit present.     Mental Status: He is alert and oriented to person, place, and time.            Labs, Imaging and Diagnostic Testing: MRI femur right: Widespread muscular edema along the hip adductor musculature and along the anterior, medial, and posterior compartments of the thigh. Imaging of the left femur shows similar findings. Top diagnostic considerations given the relative diffuse appearance would be polymyositis and inclusion body myositis. The vastus lateralis would be a reasonable targeting site if biopsy is warranted. 2. Mild subcutaneous edema along the medial thigh.   MRI femur left: 1. Widespread edema along the left hip adductor musculature and left thigh musculature, with some relative sparing of the rectus femoris muscle. 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 The vastus lateralis would be a reasonable target for biopsy. 2. Subcutaneous edema along the medial thigh. 3. No bony involvement is identified.       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 I personally reviewed his imaging and referral notes. I discussed an incisional biopsy of the thigh muscle and he agrees to proceed with surgery. This will be scheduled as soon as possible, tentatively next week. He does not need to hold aspirin for surgery. He will be contacted by our schedulers to confirm a surgery date. All questions were answered.  Sophronia Simas, MD [facility] Surgery General, Hepatobiliary and Pancreatic Surgery 01/30/23 2:13 PM

## 2023-01-30 NOTE — H&P (View-Only) (Signed)
History of Present Illness: Stuart Collins is a 57 y.o. male who was referred to me for evaluation of a muscle biopsy. He has developed proximal muscle weakness starting last fall, and was found to have an elevated CK of 2268. He was on statin therapy, which was discontinued, and CK improved to 1300. However his symptoms have persisted, and have especially worsened since January. He now has difficulty standing up and uses a cane for assistance .He had a bilateral thigh MRI on 4/26, which showed muscular edema in the thigh muscles, suspicious for myositis. He has been referred for a muscle biopsy.   He takes a baby aspirin daily but no other blood thinners.     Review of Systems: A complete review of systems was obtained from the patient.  I have reviewed this information and discussed as appropriate with the patient.  See HPI as well for other ROS.       Medical History: Past Medical History Past Medical History: Diagnosis Date  Arthritis    Asthma, unspecified asthma severity, unspecified whether complicated, unspecified whether persistent (HHS-HCC)    GERD (gastroesophageal reflux disease)    Hyperlipidemia    Liver disease         Problem List There is no problem list on file for this patient.     Past Surgical History Past Surgical History: Procedure Laterality Date  TONSILLECTOMY   1970  TWISTED TESTICLES    1970      Allergies Allergies Allergen Reactions  Mushroom Shortness Of Breath  Peanut Shortness Of Breath     All nuts  Morphine Other (See Comments)     REACTION: "makes my head feel like its stuffed; like it's going to pop"  Shellfish Containing Products Hives  Codeine Other (See Comments)     REACTION: "makes my head feel like its stuffed; like it's going to pop"      Medications Ordered Prior to Encounter Current Outpatient Medications on File Prior to Visit Medication Sig Dispense Refill  abatacept (ORENCIA) 125 mg/mL injection syringe Inject  subcutaneously      aspirin 81 MG EC tablet Take 81 mg by mouth once daily      azelastine (ASTELIN) 137 mcg nasal spray Place into one nostril      cetirizine (ZYRTEC) 10 MG tablet Take 10 mg by mouth at bedtime      fenofibrate 160 MG tablet Take 160 mg by mouth once daily      fluticasone furoate 50 mcg/actuation DsDv        lansoprazole (PREVACID) 30 MG DR capsule 1 capsule before a meal Orally Once a day for 90 days      montelukast (SINGULAIR) 10 mg tablet        mv-min-folic-K1-lycopen-lutein (CENTRUM MINIS MEN 50 PLUS) 150-30-300-150 mcg Tab        potassium citrate (UROCIT-K) 15 mEq ER tablet        sucralfate (CARAFATE) 1 gram tablet TAKE 1 TABLET BY MOUTH TWICE A DAY ON AN EMPTY STOMACH        No current facility-administered medications on file prior to visit.      Family History History reviewed. No pertinent family history.     Tobacco Use History Social History    Tobacco Use Smoking Status Former  Types: Cigarettes  Start date: 1985 Smokeless Tobacco Never      Social History Social History    Socioeconomic History  Marital status: Single Tobacco Use  Smoking status: Former       Types: Cigarettes     Start date: 1985  Smokeless tobacco: Never Substance and Sexual Activity  Alcohol use: Not Currently  Drug use: Never    Social Determinants of Health      Received from [facility]   Social Network      Objective:     Vitals:   01/30/23 1326 BP: 134/78 Pulse: 82 Temp: 36.7 C (98 F) SpO2: 99% Weight: 73.5 kg (162 lb) Height: 175.3 cm (5' 9") PainSc:   2 PainLoc: Leg   Body mass index is 23.92 kg/m.   Physical Exam Vitals reviewed.  Constitutional:      General: He is not in acute distress.    Appearance: Normal appearance.  HENT:     Head: Normocephalic and atraumatic.  Cardiovascular:     Rate and Rhythm: Normal rate and regular rhythm.     Heart sounds: No murmur heard. Pulmonary:     Effort: Pulmonary effort is  normal. No respiratory distress.     Breath sounds: Normal breath sounds. No wheezing.  Musculoskeletal:        General: No deformity.  Skin:    General: Skin is warm and dry.     Coloration: Skin is not jaundiced.  Neurological:     General: No focal deficit present.     Mental Status: He is alert and oriented to person, place, and time.            Labs, Imaging and Diagnostic Testing: MRI femur right: Widespread muscular edema along the hip adductor musculature and along the anterior, medial, and posterior compartments of the thigh. Imaging of the left femur shows similar findings. Top diagnostic considerations given the relative diffuse appearance would be polymyositis and inclusion body myositis. The vastus lateralis would be a reasonable targeting site if biopsy is warranted. 2. Mild subcutaneous edema along the medial thigh.   MRI femur left: 1. Widespread edema along the left hip adductor musculature and left thigh musculature, with some relative sparing of the rectus femoris muscle. 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 The vastus lateralis would be a reasonable target for biopsy. 2. Subcutaneous edema along the medial thigh. 3. No bony involvement is identified.       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 I personally reviewed his imaging and referral notes. I discussed an incisional biopsy of the thigh muscle and he agrees to proceed with surgery. This will be scheduled as soon as possible, tentatively next week. He does not need to hold aspirin for surgery. He will be contacted by our schedulers to confirm a surgery date. All questions were answered.  Mahiya Kercheval, MD [facility] General, Hepatobiliary and Pancreatic Surgery 01/30/23 2:13 PM   

## 2023-01-31 ENCOUNTER — Encounter: Payer: Self-pay | Admitting: Internal Medicine

## 2023-01-31 LAB — HEPATIC FUNCTION PANEL
ALT: 248 IU/L — ABNORMAL HIGH (ref 0–44)
AST: 380 IU/L — ABNORMAL HIGH (ref 0–40)
Albumin: 2.6 g/dL — ABNORMAL LOW (ref 3.8–4.9)
Alkaline Phosphatase: 74 IU/L (ref 44–121)
Bilirubin Total: 0.4 mg/dL (ref 0.0–1.2)
Bilirubin, Direct: 0.18 mg/dL (ref 0.00–0.40)
Total Protein: 5.7 g/dL — ABNORMAL LOW (ref 6.0–8.5)

## 2023-02-04 ENCOUNTER — Encounter (HOSPITAL_BASED_OUTPATIENT_CLINIC_OR_DEPARTMENT_OTHER): Payer: Self-pay | Admitting: Surgery

## 2023-02-05 ENCOUNTER — Encounter (HOSPITAL_BASED_OUTPATIENT_CLINIC_OR_DEPARTMENT_OTHER): Payer: Self-pay | Admitting: Surgery

## 2023-02-05 ENCOUNTER — Ambulatory Visit: Payer: 59 | Admitting: Internal Medicine

## 2023-02-05 NOTE — Progress Notes (Addendum)
Spoke w/ via phone for pre-op interview--- pt Lab needs dos----   no            Lab results------ pt has current lab results in epic hepatic panel 01-30-2023/  CBC/ CMP 01-28-2023 COVID test -----patient states asymptomatic no test needed Arrive at ------- 0830 on 02-15-2023 NPO after MN NO Solid Food.  Clear liquids from MN until--- 0730 Med rec completed Medications to take morning of surgery ----- prevacid, nasal sprays, potassium citrate Diabetic medication ----- n/a Patient instructed no nail polish to be worn day of surgery Patient instructed to bring photo id and insurance card day of surgery Patient aware to have Driver (ride )--  per pt he has hired a caregiver from [employer] to be his responsible driver to take him home  / caregiver for 24 hours after surgery--- his neighbor-- bene mutisya will be staying at his home as his caregiver  Pt did have name and contact number of caregiver/ driver from [employer] but is aware to have name and number when check in [place] also will have neighbor's number at check-in too.  Patient Special Instructions -----  asked to bring rescue inhaler dos Pre-Op special Instructions -----  pt general muscle weakness, walks w/ cane Patient verbalized understanding of instructions that were given at this phone interview. Patient denies shortness of breath, chest pain, fever, cough at this phone interview.  Chart reviewed w/ anesthesia , Dr Reece Agar. Rose MDA.  Per Dr Okey Dupre ok to proceed.

## 2023-02-08 ENCOUNTER — Encounter: Payer: Self-pay | Admitting: Diagnostic Neuroimaging

## 2023-02-08 ENCOUNTER — Ambulatory Visit: Payer: Managed Care, Other (non HMO) | Admitting: Diagnostic Neuroimaging

## 2023-02-08 VITALS — BP 113/67 | HR 76 | Ht 69.0 in | Wt 164.6 lb

## 2023-02-08 DIAGNOSIS — G729 Myopathy, unspecified: Secondary | ICD-10-CM | POA: Diagnosis not present

## 2023-02-08 DIAGNOSIS — G629 Polyneuropathy, unspecified: Secondary | ICD-10-CM | POA: Diagnosis not present

## 2023-02-08 NOTE — Patient Instructions (Signed)
  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

## 2023-02-08 NOTE — Progress Notes (Signed)
[facility]  PATIENT: Stuart Collins DOB: 07-16-1966  REFERRING CLINICIAN: Donnetta Hail, MD HISTORY FROM: patient  REASON FOR VISIT: new consult   HISTORICAL  CHIEF COMPLAINT:  Chief Complaint  Patient presents with   New Patient (Initial Visit)    Rm 6, alone Weakness:myositis is what the rheumatologist believes he has and he wants to confirm, extremity weakness, hip/shoulders also weak, swelling from the hips down in the past 2 weeks, fatigued    HISTORY OF PRESENT ILLNESS:   57 year old male here for evaluation of myopathy. History of rheumatoid arthritis from approximately 30 years ago.  He has been on variety of medications over the years.  In February 2024 patient had onset of worsening gait and balance difficulty.  He developed significant muscle weakness particularly when getting up from sitting position or going up and down stairs.  He also noticed some problems with overhead movements with his arms and hands.  CK level was found to be elevated at 2268 in February 2024.  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  However deltoid and iliopsoas, gluteus medius muscles were not tested.  He has been referred for muscle biopsy, now scheduled to be done next Friday.   REVIEW OF SYSTEMS: Full 14 system review of systems performed and negative with exception of: as per hPI.  ALLERGIES: Allergies  Allergen Reactions   Mushroom Extract Complex Shortness Of Breath    All types mushrooms   Peanut-Containing Drug Products Shortness Of Breath    All nuts   Shellfish Allergy Shortness Of Breath and Hives    All shellfish   Morphine Other (See Comments)    REACTION: "makes my head feel like its stuffed; like it's going to pop"   Codeine Other (See Comments)    REACTION: "makes my head feel like its stuffed; like it's going to pop"   Morphine And Codeine     REACTION: "makes my  head feel like its stuffed; like it's going to pop"    HOME MEDICATIONS: Outpatient Medications Prior to Visit  Medication Sig Dispense Refill   Abatacept 125 MG/ML SOSY Inject 125 mg into the skin once a week. On sundays     Albuterol Sulfate (PROAIR RESPICLICK) 108 (90 Base) MCG/ACT AEPB Inhale 1 puff into the lungs every 6 (six) hours as needed (wheezing).     aspirin EC 81 MG tablet Take 81 mg by mouth 3 (three) times a week. M/  W/  F  in the am     AUVI-Q 0.3 MG/0.3ML SOAJ injection Inject 0.3 mg into the muscle as needed for anaphylaxis.     azelastine (ASTELIN) 137 MCG/SPRAY nasal spray Place 2 sprays into the nose daily. Use in each nostril as directed      BEPREVE 1.5 % SOLN Place 1 drop into both eyes 2 (two) times daily as needed for allergies.  5   cetirizine (ZYRTEC) 10 MG tablet Take 10 mg by mouth at bedtime.      esomeprazole (NEXIUM) 40 MG capsule Take 40 mg by mouth daily at 12 noon.     famotidine (PEPCID) 20 MG tablet Take 20 mg by mouth 2 (two) times daily.     fenofibrate 160 MG tablet Take 160 mg by mouth daily.     fluticasone (FLONASE) 50 MCG/ACT nasal spray Place 2 sprays into the nose 2 (two) times daily.     lansoprazole (PREVACID) 30  MG capsule Take 30 mg by mouth 2 (two) times daily before a meal.     montelukast (SINGULAIR) 10 MG tablet Take 1 tablet by mouth at bedtime.     Multiple Vitamin (MULITIVITAMIN WITH MINERALS) TABS Take 1 tablet by mouth daily.     Pitavastatin Calcium (LIVALO) 2 MG TABS Take 2 mg by mouth daily.     Potassium Citrate 15 MEQ (1620 MG) TBCR Take 15 mEq by mouth daily.     prednisoLONE 5 MG TABS tablet Take 5 mg by mouth daily.     PRESCRIPTION MEDICATION Inject as directed once a week. Allergy shots weekly     ramelteon (ROZEREM) 8 MG tablet Take 8 mg by mouth at bedtime as needed for sleep. For sleep     sucralfate (CARAFATE) 1 g tablet Take 1 g by mouth 2 (two) times daily.     triamcinolone cream (KENALOG) 0.1 % Apply topically  daily. (Patient taking differently: Apply 1 Application topically daily as needed.) 454 g 6   metroNIDAZOLE (FLAGYL) 500 MG tablet Take 1 tablet (500 mg total) by mouth 2 (two) times daily. One po bid x 7 days (Patient not taking: Reported on 02/08/2023) 14 tablet 0   No facility-administered medications prior to visit.    PAST MEDICAL HISTORY: Past Medical History:  Diagnosis Date   Allergic arthritis    Asthma, mild intermittent    followed by dr Elvera Lennox. Irena Cords ([facility] [facility])   Coronary artery calcification seen on CAT scan 03/2014   cardiologist--- dr Rennis Golden;  calcium score=65.5 involving LAD   Dyspnea    due to weakness   Elevated liver enzymes    referred to [facility]--- dr Marca Ancona by pcp   Family history of adverse reaction to anesthesia    mother and father--- ponv   Family history of premature CAD    NUCLEAR STRESS TEST, 12/30/2002 - no evidence of ischemia   GERD (gastroesophageal reflux disease)    Hepatic steatosis determined by biopsy of liver 2003   History of basal cell carcinoma (BCC) excision 08/18/2014   12/ 2018 nodular right chest tx; cx3 32fu;   03/ 2023  right breast (curet and 5FU)   History of kidney stones    urologist--- dr Mena Goes   Hyperlipidemia, mixed    followed by dr hilty   Muscle weakness (generalized)    02-05-2023  per pt using cane   NASH (nonalcoholic steatohepatitis)    followed by dr Marca Ancona  (GI)   Nocturia    RA (rheumatoid arthritis) [facility])    rheumatologist--- dr Dierdre Forth;  multiple sites   Raynaud's disease    Sciatica, right side    Wears glasses     PAST SURGICAL HISTORY: Past Surgical History:  Procedure Laterality Date   CYSTOSCOPY W/ URETERAL STENT PLACEMENT  12/02/2011   Procedure: CYSTOSCOPY WITH RETROGRADE PYELOGRAM/URETERAL STENT PLACEMENT;  Surgeon: Kathi Ludwig, MD;  Location: [facility];  Service: Urology;  Laterality: Right;   CYSTOSCOPY W/ URETERAL STENT PLACEMENT  12/15/2011   Procedure: CYSTOSCOPY  WITH STENT REPLACEMENT;  Surgeon: Milford Cage, MD;  Location: [facility];  Service: Urology;  Laterality: Right;  right reter stent removal and stent placement   CYSTOSCOPY/RETROGRADE/URETEROSCOPY  12/15/2011   Procedure: CYSTOSCOPY/RETROGRADE/URETEROSCOPY;  Surgeon: Milford Cage, MD;  Location: [facility];  Service: Urology;  Laterality: Right;  no retrograde   LIVER BIOPSY  05/29/2002   ORCHIOPEXY  1970   TONSILLECTOMY  1970   UPPER  GI ENDOSCOPY  03/17/2002    FAMILY HISTORY: Family History  Problem Relation Age of Onset   Kidney Stones Mother    Arthritis Mother 35       Osteo   Heart disease Father 75   Heart attack Maternal Grandfather 45   Hyperlipidemia Brother     SOCIAL HISTORY: Social History   Socioeconomic History   Marital status: Single    Spouse name: Not on file   Number of children: 0   Years of education: Not on file   Highest education level: Not on file  Occupational History   Not on file  Tobacco Use   Smoking status: Former    Years: 4    Types: Cigarettes    Quit date: 1980    Years since quitting: 44.4   Smokeless tobacco: Never  Vaping Use   Vaping Use: Never used  Substance and Sexual Activity   Alcohol use: Not Currently   Drug use: Never   Sexual activity: Not Currently  Other Topics Concern   Not on file  Social History Narrative   Not on file   Social Determinants of Health   Financial Resource Strain: Not on file  Food Insecurity: Not on file  Transportation Needs: Not on file  Physical Activity: Not on file  Stress: Not on file  Social Connections: Not on file  Intimate Partner Violence: Not on file     PHYSICAL EXAM  GENERAL EXAM/CONSTITUTIONAL: Vitals:  Vitals:   02/08/23 0823  BP: 113/67  Pulse: 76  Weight: 164 lb 9.6 oz (74.7 kg)  Height: 5\' 9"  (1.753 m)   Body mass index is 24.31 kg/m. Wt Readings from Last 3 Encounters:  02/08/23 164 lb 9.6 oz (74.7 kg)  01/28/23 160 lb (72.6 kg)  07/27/22  153 lb 3.2 oz (69.5 kg)   Patient is in no distress; well developed, nourished and groomed; neck is supple  CARDIOVASCULAR: Examination of carotid arteries is normal; no carotid bruits Regular rate and rhythm, no murmurs Examination of peripheral vascular system by observation and palpation is normal  EYES: Ophthalmoscopic exam of optic discs and posterior segments is normal; no papilledema or hemorrhages No results found.  MUSCULOSKELETAL: Gait, strength, tone, movements noted in Neurologic exam below  NEUROLOGIC: MENTAL STATUS:      No data to display         awake, alert, oriented to person, place and time recent and remote memory intact normal attention and concentration language fluent, comprehension intact, naming intact fund of knowledge appropriate  CRANIAL NERVE:  2nd - no papilledema on fundoscopic exam 2nd, 3rd, 4th, 6th - pupils equal and reactive to light, visual fields full to confrontation, extraocular muscles intact, no nystagmus 5th - facial sensation symmetric 7th - facial strength symmetric 8th - hearing intact 9th - palate elevates symmetrically, uvula midline 11th - shoulder shrug symmetric 12th - tongue protrusion midline  MOTOR:  normal bulk and tone, full strength in the BUE, BLE; EXCEPT DELTOID 3, TRICEPS 3, HIP FLEXION 3, KNEE FLEXION 4  SENSORY:  normal and symmetric to light touch, pinprick, temperature, vibration  COORDINATION:  finger-nose-finger, fine finger movements normal  REFLEXES:  deep tendon reflexes 1+ and symmetric  GAIT/STATION:  WIDE GAIT; SLIGHTLY WADDLE; able to walk on toes, heels; romberg is negative     DIAGNOSTIC DATA (LABS, IMAGING, TESTING) - I reviewed patient records, labs, notes, testing and imaging myself where available.  Lab Results  Component Value Date   WBC  4.2 01/28/2023   HGB 12.2 (L) 01/28/2023   HCT 36.4 (L) 01/28/2023   MCV 90.5 01/28/2023   PLT 190 01/28/2023      Component Value  Date/Time   NA 123 (L) 01/28/2023 0303   K 3.8 01/28/2023 0303   CL 94 (L) 01/28/2023 0303   CO2 23 01/28/2023 0303   GLUCOSE 73 01/28/2023 0303   BUN 19 01/28/2023 0303   CREATININE 0.77 01/28/2023 0303   CREATININE 1.18 07/16/2014 0801   CALCIUM 8.9 01/28/2023 0303   PROT 5.7 (L) 01/30/2023 1419   ALBUMIN 2.6 (L) 01/30/2023 1419   AST 380 (H) 01/30/2023 1419   ALT 248 (H) 01/30/2023 1419   ALKPHOS 74 01/30/2023 1419   BILITOT 0.4 01/30/2023 1419   GFRNONAA >60 01/28/2023 0303   GFRAA >60 11/17/2017 1435   Lab Results  Component Value Date   CHOL 165 12/21/2022   HDL 25 (L) 12/21/2022   LDLCALC 101 (H) 12/21/2022   TRIG 228 (H) 12/21/2022   CHOLHDL 6.6 (H) 12/21/2022   No results found for: "HGBA1C" No results found for: "VITAMINB12" No results found for: "TSH"   10/24/2022 EMG nerve conduction study (Dr. Neale Burly) -Moderate sensorimotor polyneuropathy  MR left femur 1. Widespread edema along the left hip adductor musculature and left thigh musculature, with some relative sparing of the rectus femoris muscle. 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 The vastus lateralis would be a reasonable target for biopsy. 2. Subcutaneous edema along the medial thigh. 3. No bony involvement is identified.  MR Right femur 1. Widespread muscular edema along the hip adductor musculature and along the anterior, medial, and posterior compartments of the thigh. Imaging of the left femur shows similar findings. Top diagnostic considerations given the relative diffuse appearance would be polymyositis and inclusion body myositis. The vastus lateralis would be a reasonable targeting site if biopsy is warranted. 2. Mild subcutaneous edema along the medial thigh.    ASSESSMENT AND PLAN  57 y.o. year old male here with:   Dx:  1. Myopathy   2. 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    Suanne Marker, MD 02/08/2023, 9:25 AM Certified in Neurology, Neurophysiology and Neuroimaging  [facility] [address] [place], [place] [address] [phone]

## 2023-02-09 LAB — MULTIPLE MYELOMA PANEL, SERUM

## 2023-02-09 LAB — VITAMIN B12: Vitamin B-12: 1539 pg/mL — ABNORMAL HIGH (ref 232–1245)

## 2023-02-12 ENCOUNTER — Inpatient Hospital Stay (HOSPITAL_COMMUNITY)
Admission: EM | Admit: 2023-02-12 | Discharge: 2023-02-14 | DRG: 640 | Disposition: A | Discharge status: Home / Self Care | Payer: Managed Care, Other (non HMO) | Attending: Family Medicine | Admitting: Family Medicine

## 2023-02-12 ENCOUNTER — Other Ambulatory Visit: Payer: Self-pay

## 2023-02-12 ENCOUNTER — Encounter (HOSPITAL_COMMUNITY): Payer: Self-pay

## 2023-02-12 ENCOUNTER — Emergency Department (HOSPITAL_COMMUNITY): Payer: Managed Care, Other (non HMO)

## 2023-02-12 DIAGNOSIS — K219 Gastro-esophageal reflux disease without esophagitis: Secondary | ICD-10-CM | POA: Diagnosis present

## 2023-02-12 DIAGNOSIS — E782 Mixed hyperlipidemia: Secondary | ICD-10-CM | POA: Diagnosis present

## 2023-02-12 DIAGNOSIS — Z8249 Family history of ischemic heart disease and other diseases of the circulatory system: Secondary | ICD-10-CM

## 2023-02-12 DIAGNOSIS — R75 Inconclusive laboratory evidence of human immunodeficiency virus [HIV]: Secondary | ICD-10-CM | POA: Diagnosis present

## 2023-02-12 DIAGNOSIS — R7989 Other specified abnormal findings of blood chemistry: Secondary | ICD-10-CM | POA: Diagnosis present

## 2023-02-12 DIAGNOSIS — S0003XA Contusion of scalp, initial encounter: Secondary | ICD-10-CM | POA: Diagnosis present

## 2023-02-12 DIAGNOSIS — I7300 Raynaud's syndrome without gangrene: Secondary | ICD-10-CM | POA: Diagnosis present

## 2023-02-12 DIAGNOSIS — E871 Hypo-osmolality and hyponatremia: Principal | ICD-10-CM | POA: Diagnosis present

## 2023-02-12 DIAGNOSIS — M609 Myositis, unspecified: Secondary | ICD-10-CM | POA: Diagnosis present

## 2023-02-12 DIAGNOSIS — Z83438 Family history of other disorder of lipoprotein metabolism and other lipidemia: Secondary | ICD-10-CM

## 2023-02-12 DIAGNOSIS — K7581 Nonalcoholic steatohepatitis (NASH): Secondary | ICD-10-CM | POA: Diagnosis present

## 2023-02-12 DIAGNOSIS — R233 Spontaneous ecchymoses: Secondary | ICD-10-CM | POA: Diagnosis present

## 2023-02-12 DIAGNOSIS — Z87442 Personal history of urinary calculi: Secondary | ICD-10-CM

## 2023-02-12 DIAGNOSIS — E8809 Other disorders of plasma-protein metabolism, not elsewhere classified: Secondary | ICD-10-CM | POA: Diagnosis present

## 2023-02-12 DIAGNOSIS — J4520 Mild intermittent asthma, uncomplicated: Secondary | ICD-10-CM | POA: Diagnosis present

## 2023-02-12 DIAGNOSIS — M6281 Muscle weakness (generalized): Secondary | ICD-10-CM | POA: Diagnosis present

## 2023-02-12 DIAGNOSIS — I2510 Atherosclerotic heart disease of native coronary artery without angina pectoris: Secondary | ICD-10-CM | POA: Diagnosis present

## 2023-02-12 DIAGNOSIS — S0001XA Abrasion of scalp, initial encounter: Secondary | ICD-10-CM | POA: Diagnosis present

## 2023-02-12 DIAGNOSIS — Y9289 Other specified places as the place of occurrence of the external cause: Secondary | ICD-10-CM

## 2023-02-12 DIAGNOSIS — Z85828 Personal history of other malignant neoplasm of skin: Secondary | ICD-10-CM

## 2023-02-12 DIAGNOSIS — Y9389 Activity, other specified: Secondary | ICD-10-CM

## 2023-02-12 DIAGNOSIS — Z7982 Long term (current) use of aspirin: Secondary | ICD-10-CM

## 2023-02-12 DIAGNOSIS — Z79899 Other long term (current) drug therapy: Secondary | ICD-10-CM

## 2023-02-12 DIAGNOSIS — Z87891 Personal history of nicotine dependence: Secondary | ICD-10-CM

## 2023-02-12 DIAGNOSIS — I5033 Acute on chronic diastolic (congestive) heart failure: Secondary | ICD-10-CM | POA: Diagnosis present

## 2023-02-12 DIAGNOSIS — M069 Rheumatoid arthritis, unspecified: Secondary | ICD-10-CM | POA: Diagnosis present

## 2023-02-12 DIAGNOSIS — Z8261 Family history of arthritis: Secondary | ICD-10-CM

## 2023-02-12 DIAGNOSIS — W108XXA Fall (on) (from) other stairs and steps, initial encounter: Secondary | ICD-10-CM | POA: Diagnosis present

## 2023-02-12 LAB — COMPREHENSIVE METABOLIC PANEL
ALT: 210 U/L — ABNORMAL HIGH (ref 0–44)
AST: 340 U/L — ABNORMAL HIGH (ref 15–41)
Albumin: 2 g/dL — ABNORMAL LOW (ref 3.5–5.0)
Alkaline Phosphatase: 66 U/L (ref 38–126)
Anion gap: 5 (ref 5–15)
BUN: 22 mg/dL — ABNORMAL HIGH (ref 6–20)
CO2: 21 mmol/L — ABNORMAL LOW (ref 22–32)
Calcium: 7.9 mg/dL — ABNORMAL LOW (ref 8.9–10.3)
Chloride: 96 mmol/L — ABNORMAL LOW (ref 98–111)
Creatinine, Ser: 0.8 mg/dL (ref 0.61–1.24)
GFR, Estimated: >60 mL/min (ref >60)
Glucose, Bld: 81 mg/dL (ref 70–99)
Potassium: 4.3 mmol/L (ref 3.5–5.1)
Sodium: 122 mmol/L — ABNORMAL LOW (ref 135–145)
Total Bilirubin: 0.8 mg/dL (ref 0.3–1.2)
Total Protein: 6 g/dL — ABNORMAL LOW (ref 6.5–8.1)

## 2023-02-12 LAB — CBC WITH DIFFERENTIAL/PLATELET
Abs Immature Granulocytes: 0 10*3/uL (ref 0.00–0.07)
Basophils Absolute: 0.1 10*3/uL (ref 0.0–0.1)
Basophils Relative: 2 %
Eosinophils Absolute: 0.2 10*3/uL (ref 0.0–0.5)
Eosinophils Relative: 5 %
HCT: 29.7 % — ABNORMAL LOW (ref 39.0–52.0)
Hemoglobin: 10.1 g/dL — ABNORMAL LOW (ref 13.0–17.0)
Immature Granulocytes: 0 %
Lymphocytes Relative: 45 %
Lymphs Abs: 1.8 10*3/uL (ref 0.7–4.0)
MCH: 30 pg (ref 26.0–34.0)
MCHC: 34 g/dL (ref 30.0–36.0)
MCV: 88.1 fL (ref 80.0–100.0)
Monocytes Absolute: 0.4 10*3/uL (ref 0.1–1.0)
Monocytes Relative: 12 %
Neutro Abs: 1.4 10*3/uL — ABNORMAL LOW (ref 1.7–7.7)
Neutrophils Relative %: 36 %
Platelets: 180 10*3/uL (ref 150–400)
RBC: 3.37 MIL/uL — ABNORMAL LOW (ref 4.22–5.81)
RDW: 14.3 % (ref 11.5–15.5)
WBC: 3.8 10*3/uL — ABNORMAL LOW (ref 4.0–10.5)
nRBC: 0.5 % — ABNORMAL HIGH (ref 0.0–0.2)

## 2023-02-12 LAB — BASIC METABOLIC PANEL
Anion gap: 3 — ABNORMAL LOW (ref 5–15)
BUN: 25 mg/dL — ABNORMAL HIGH (ref 6–20)
CO2: 22 mmol/L (ref 22–32)
Calcium: 8.3 mg/dL — ABNORMAL LOW (ref 8.9–10.3)
Chloride: 98 mmol/L (ref 98–111)
Creatinine, Ser: 0.89 mg/dL (ref 0.61–1.24)
GFR, Estimated: >60 mL/min (ref >60)
Glucose, Bld: 86 mg/dL (ref 70–99)
Potassium: 4.7 mmol/L (ref 3.5–5.1)
Sodium: 123 mmol/L — ABNORMAL LOW (ref 135–145)

## 2023-02-12 LAB — CREATININE, URINE, RANDOM: Creatinine, Urine: 66 mg/dL

## 2023-02-12 LAB — OSMOLALITY: Osmolality: 265 mOsm/kg — ABNORMAL LOW (ref 275–295)

## 2023-02-12 LAB — CK: Total CK: 1088 U/L — ABNORMAL HIGH (ref 49–397)

## 2023-02-12 LAB — SODIUM, URINE, RANDOM: Sodium, Ur: 58 mmol/L

## 2023-02-12 MED ORDER — LACTATED RINGERS IV BOLUS
500.0000 mL | Freq: Once | INTRAVENOUS | Status: AC
  Administered 2023-02-12: 500 mL via INTRAVENOUS

## 2023-02-12 MED ORDER — RAMELTEON 8 MG PO TABS: 8.0000 mg | ORAL_TABLET | Freq: Every day | ORAL | Status: DC

## 2023-02-12 MED ORDER — FLUTICASONE PROPIONATE 50 MCG/ACT NA SUSP
2.0000 | Freq: Two times a day (BID) | NASAL | Status: DC
  Filled 2023-02-12: qty 16

## 2023-02-12 MED ORDER — IBUPROFEN 200 MG PO TABS
400.0000 mg | ORAL_TABLET | Freq: Four times a day (QID) | ORAL | Status: DC | PRN
  Administered 2023-02-12 – 2023-02-14 (×3): 400 mg via ORAL
  Filled 2023-02-12 (×3): qty 2

## 2023-02-12 MED ORDER — ALBUTEROL SULFATE (2.5 MG/3ML) 0.083% IN NEBU: 2.5000 mg | INHALATION_SOLUTION | RESPIRATORY_TRACT | Status: DC | PRN

## 2023-02-12 MED ORDER — ASPIRIN 81 MG PO TBEC
81.0000 mg | DELAYED_RELEASE_TABLET | ORAL | Status: DC
  Administered 2023-02-13: 81 mg via ORAL
  Filled 2023-02-12: qty 1

## 2023-02-12 MED ORDER — ONDANSETRON HCL 4 MG PO TABS: 4.0000 mg | ORAL_TABLET | Freq: Four times a day (QID) | ORAL | Status: DC | PRN

## 2023-02-12 MED ORDER — ONDANSETRON HCL 4 MG/2ML IJ SOLN: 4.0000 mg | Freq: Four times a day (QID) | INTRAMUSCULAR | Status: DC | PRN

## 2023-02-12 MED ORDER — ONDANSETRON HCL 4 MG/2ML IJ SOLN
4.0000 mg | Freq: Once | INTRAMUSCULAR | Status: AC
  Administered 2023-02-12: 4 mg via INTRAVENOUS
  Filled 2023-02-12: qty 2

## 2023-02-12 MED ORDER — SENNOSIDES-DOCUSATE SODIUM 8.6-50 MG PO TABS: 1.0000 | ORAL_TABLET | Freq: Every evening | ORAL | Status: DC | PRN

## 2023-02-12 MED ORDER — ENOXAPARIN SODIUM 40 MG/0.4ML IJ SOSY
40.0000 mg | PREFILLED_SYRINGE | INTRAMUSCULAR | Status: DC
  Administered 2023-02-12 – 2023-02-13 (×2): 40 mg via SUBCUTANEOUS
  Filled 2023-02-12 (×2): qty 0.4

## 2023-02-12 MED ORDER — MONTELUKAST SODIUM 10 MG PO TABS
10.0000 mg | ORAL_TABLET | Freq: Every day | ORAL | Status: DC
  Administered 2023-02-12 – 2023-02-13 (×2): 10 mg via ORAL
  Filled 2023-02-12 (×2): qty 1

## 2023-02-12 MED ORDER — SODIUM CHLORIDE 0.9 % IV SOLN: INTRAVENOUS | Status: DC

## 2023-02-12 MED ORDER — SUCRALFATE 1 G PO TABS
1.0000 g | ORAL_TABLET | Freq: Two times a day (BID) | ORAL | Status: DC
  Administered 2023-02-13: 1 g via ORAL
  Filled 2023-02-12 (×5): qty 1

## 2023-02-12 MED ORDER — LORATADINE 10 MG PO TABS
10.0000 mg | ORAL_TABLET | Freq: Every day | ORAL | Status: DC
  Administered 2023-02-12 – 2023-02-13 (×2): 10 mg via ORAL
  Filled 2023-02-12 (×2): qty 1

## 2023-02-12 MED ORDER — FAMOTIDINE 20 MG PO TABS
20.0000 mg | ORAL_TABLET | Freq: Two times a day (BID) | ORAL | Status: DC
  Administered 2023-02-12 – 2023-02-14 (×4): 20 mg via ORAL
  Filled 2023-02-12 (×4): qty 1

## 2023-02-12 NOTE — ED Triage Notes (Signed)
BIBA from home c/o mechanical fall.  Hit head on concrete floor with hematoma and abrasion to back of head.  Dizziness and nausea.  Denies blood thinner usage. Denies loc [id number] Denies neck/back pain

## 2023-02-12 NOTE — ED Provider Notes (Signed)
[facility] EMERGENCY DEPARTMENT AT [facility] Provider Note   CSN: [id number] Arrival date & time: 02/12/23  4098     History  Chief Complaint  Patient presents with   Stuart Collins is a 57 y.o. male.  HPI 57 year old male presents after a fall and head injury.  He was walking up the steps to take the trash out and his legs became weak at the top and he fell backwards, striking his head.  He did not lose consciousness.  He has an abrasion to the back of his head.  His neck feels a little sore/stiff on the lateral aspects but no midline pain.  No back pain.  He has been dealing with on and off leg weakness since September and his rheumatologist thinks he might have myositis and is due for a muscle biopsy later this week.  He has noticed some leg swelling on and off during these months and he is having a little more swelling over the last few days.  No chest pain, cough, shortness of breath.  Feels that he might be a little dehydrated as he frequently wakes up and has a very dry mouth in the middle of the night.  Home Medications Prior to Admission medications   Medication Sig Start Date End Date Taking? Authorizing Provider  Abatacept 125 MG/ML SOSY Inject 125 mg into the skin once a week. On sundays   Yes [provider]  aspirin EC 81 MG tablet Take 81 mg by mouth 3 (three) times a week. M/  W/  F  in the am   Yes [provider]  azelastine (ASTELIN) 137 MCG/SPRAY nasal spray Place 2 sprays into the nose daily. Use in each nostril as directed    Yes [provider]  BEPREVE 1.5 % SOLN Place 1 drop into both eyes 2 (two) times daily as needed for allergies. 10/25/17  Yes [provider]  cetirizine (ZYRTEC) 10 MG tablet Take 10 mg by mouth at bedtime.    Yes [provider]  fluticasone (FLONASE) 50 MCG/ACT nasal spray Place 2 sprays into the nose 2 (two) times daily.   Yes [provider]  lansoprazole (PREVACID) 30  MG capsule Take 30 mg by mouth 2 (two) times daily before a meal.   Yes [provider]  montelukast (SINGULAIR) 10 MG tablet Take 1 tablet by mouth at bedtime. 01/30/16  Yes [provider]  Multiple Vitamin (MULITIVITAMIN WITH MINERALS) TABS Take 1 tablet by mouth daily.   Yes [provider]  Potassium Citrate 15 MEQ (1620 MG) TBCR Take 15 mEq by mouth daily. 01/14/13  Yes [provider]  sucralfate (CARAFATE) 1 g tablet Take 1 g by mouth 2 (two) times daily. 11/14/21  Yes [provider]  Albuterol Sulfate (PROAIR RESPICLICK) 108 (90 Base) MCG/ACT AEPB Inhale 1 puff into the lungs every 6 (six) hours as needed (wheezing).    [provider]  AUVI-Q 0.3 MG/0.3ML SOAJ injection Inject 0.3 mg into the muscle as needed for anaphylaxis. Patient not taking: Reported on 02/12/2023 09/28/21   [provider]  famotidine (PEPCID) 20 MG tablet Take 20 mg by mouth 2 (two) times daily. Patient not taking: Reported on 02/12/2023    [provider]  ramelteon (ROZEREM) 8 MG tablet Take 8 mg by mouth at bedtime as needed for sleep. For sleep    [provider]  triamcinolone cream (KENALOG) 0.1 % Apply topically daily.  Patient taking differently: Apply 1 Application topically daily as needed. 07/20/20   Janalyn Harder, MD      Allergies    Mushroom extract complex, Peanut-containing drug products, Shellfish allergy, Morphine, Codeine, and Morphine and codeine    Review of Systems   Review of Systems  Respiratory:  Negative for cough and shortness of breath.   Cardiovascular:  Positive for leg swelling. Negative for chest pain.  Gastrointestinal:  Positive for nausea. Negative for vomiting.  Neurological:  Positive for weakness and headaches. Negative for numbness.    Physical Exam Updated Vital Signs BP 133/82 (BP Location: Right Arm)   Pulse 81   Temp 98.5 F (36.9 C) (Oral)   Resp 18   Wt 74 kg   SpO2 100%   BMI  24.09 kg/m  Physical Exam Vitals and nursing note reviewed.  Constitutional:      General: He is not in acute distress.    Appearance: He is well-developed. He is not ill-appearing or diaphoretic.  HENT:     Head: Normocephalic. Abrasion present.   Eyes:     Extraocular Movements: Extraocular movements intact.     Pupils: Pupils are equal, round, and reactive to light.  Neck:     Comments: No midline or paraspinal tenderness. No decreased ROM on exam Cardiovascular:     Rate and Rhythm: Normal rate and regular rhythm.     Heart sounds: Normal heart sounds.  Pulmonary:     Effort: Pulmonary effort is normal.     Breath sounds: Normal breath sounds.  Abdominal:     Palpations: Abdomen is soft.     Tenderness: There is no abdominal tenderness.  Musculoskeletal:     Cervical back: No spinous process tenderness.     Right lower leg: Edema present.     Left lower leg: Edema present.     Comments: Mild edema to bilateral lower legs/ankles  Skin:    General: Skin is warm and dry.  Neurological:     Mental Status: He is alert.     Comments: CN 3-12 grossly intact. 5/5 strength in both upper extremities. 4/5 strength in both lower extremities. Grossly normal sensation. Normal finger to nose.     ED Results / Procedures / Treatments   Labs (all labs ordered are listed, but only abnormal results are displayed) Labs Reviewed  COMPREHENSIVE METABOLIC PANEL - Abnormal; Notable for the following components:      Result Value   Sodium 122 (*)    Chloride 96 (*)    CO2 21 (*)    BUN 22 (*)    Calcium 7.9 (*)    Total Protein 6.0 (*)    Albumin 2.0 (*)    AST 340 (*)    ALT 210 (*)    All other components within normal limits  CBC WITH DIFFERENTIAL/PLATELET - Abnormal; Notable for the following components:   WBC 3.8 (*)    RBC 3.37 (*)    Hemoglobin 10.1 (*)    HCT 29.7 (*)    nRBC 0.5 (*)    Neutro Abs 1.4 (*)    All other components within normal limits  CK - Abnormal;  Notable for the following components:   Total CK 1,088 (*)    All other components within normal limits  SODIUM, URINE, RANDOM  CREATININE, URINE, RANDOM  OSMOLALITY  BASIC METABOLIC PANEL  HIV ANTIBODY (ROUTINE TESTING W REFLEX)    EKG EKG Interpretation  Date/Time:  Tuesday Feb 12 2023 08:44:35  EDT Ventricular Rate:  81 PR Interval:  165 QRS Duration: 93 QT Interval:  373 QTC Calculation: 433 R Axis:   -7 Text Interpretation: Sinus rhythm Borderline low voltage, extremity leads no acute ST/T changes similar to Jan 28 2023 Confirmed by Pricilla Loveless [phone]) on 02/12/2023 8:51:44 AM  Radiology CT Head Wo Contrast  Result Date: 02/12/2023 CLINICAL DATA:  [name] fall. Hit head on the concrete floor with hematoma and abrasion of the back of the head. EXAM: CT HEAD WITHOUT CONTRAST TECHNIQUE: Contiguous axial images were obtained from the base of the skull through the vertex without intravenous contrast. RADIATION DOSE REDUCTION: This exam was performed according to the departmental dose-optimization program which includes automated exposure control, adjustment of the mA and/or kV according to patient size and/or use of iterative reconstruction technique. COMPARISON:  None Available. FINDINGS: Brain: No evidence of acute infarction, hemorrhage, hydrocephalus, extra-axial collection or mass lesion/mass effect. Vascular: No hyperdense vessel or unexpected calcification. Skull: Right parietal scalp hematoma. No calvarial fracture focal bone lesion. Sinuses/Orbits: No acute finding. Other: None. IMPRESSION: 1. No acute intracranial abnormality. 2. Right parietal scalp hematoma. No calvarial fracture. Electronically Signed   By: Larose Hires D.O.   On: 02/12/2023 09:58    Procedures Procedures    Medications Ordered in ED Medications  aspirin EC tablet 81 mg (has no administration in time range)  ramelteon (ROZEREM) tablet 8 mg (has no administration in time range)  famotidine (PEPCID)  tablet 20 mg (has no administration in time range)  sucralfate (CARAFATE) tablet 1 g (has no administration in time range)  loratadine (CLARITIN) tablet 10 mg (has no administration in time range)  fluticasone (FLONASE) 50 MCG/ACT nasal spray 2 spray (has no administration in time range)  montelukast (SINGULAIR) tablet 10 mg (has no administration in time range)  enoxaparin (LOVENOX) injection 40 mg (has no administration in time range)  0.9 %  sodium chloride infusion (has no administration in time range)  ibuprofen (ADVIL) tablet 400 mg (has no administration in time range)  senna-docusate (Senokot-S) tablet 1 tablet (has no administration in time range)  ondansetron (ZOFRAN) tablet 4 mg (has no administration in time range)    Or  ondansetron (ZOFRAN) injection 4 mg (has no administration in time range)  albuterol (PROVENTIL) (2.5 MG/3ML) 0.083% nebulizer solution 2.5 mg (has no administration in time range)  lactated ringers bolus 500 mL (0 mLs Intravenous Stopped 02/12/23 1049)  ondansetron (ZOFRAN) injection 4 mg (4 mg Intravenous Given 02/12/23 1914)    ED Course/ Medical Decision Making/ A&P                             Medical Decision Making Amount and/or Complexity of Data Reviewed Labs: ordered.    Details: Sodium 122 Radiology: ordered and independent interpretation performed.    Details: No head bleed ECG/medicine tests: ordered and independent interpretation performed.    Details: No acute ischemia  Risk Prescription drug management. Decision regarding hospitalization.   Patient presents with weakness and fall.  Head CT is reassuring and negative.  He has an abrasion but no indication for laceration repair.  His sodium is a little worse than when he was here about 2 weeks ago, down from 123-122.  Unfortunately prior to that we do not have a sodium for the last few years.  However I do think that this sodium level today could be contributing to his weakness and while it  might be  a combination of myopathy and the sodium, I think it would be reasonable to admit for further workup and treatment.  He has noted to have a pretty low albumin at 2.0 which might be contributing to the leg swelling as his clinical picture is not consistent with CHF.  He had been given a small LR bolus for his weakness prior to the lab work returning and otherwise, I will defer further sodium management to the hospitalist service.  He is not altered.  Discussed with Dr. Kirby Crigler for admission.        Final Clinical Impression(s) / ED Diagnoses Final diagnoses:  Hyponatremia    Rx / DC Orders ED Discharge Orders     None         Pricilla Loveless, MD 02/12/23 1353

## 2023-02-12 NOTE — H&P (Signed)
History and Physical  Stuart Collins:[id number] DOB: Jan 13, 1966 DOA: 02/12/2023  PCP: Ralene Ok, MD   Chief Complaint: fall   HPI: Stuart Collins is a 57 y.o. male with medical history significant for rheumatoid arthritis and myositis of unclear etiology being admitted to the hospital with hyponatremia after a fall at home.  Patient states he had a fall back in January, today was taking the trash out in front of his townhome, up a few steps, was stepping into his house when his legs gave out, and he fell backwards onto his concrete landing striking the back of his head.  Never lost consciousness, never had palpitations, chest pain, dizziness, lightheadedness.  Of note, he was in the ER 5/13 with GI symptoms at that time was found to have low sodium 123.  On further discussion with the patient, he denies any recent medication changes, he feels that he eats and drinks well, has a good appetite.  He has been trying to stay hydrated and drink a lot of water in the last few weeks.  He is scheduled to have outpatient excisional biopsy to evaluate for myositis on Friday 5/31.  ED Course: On evaluation in the ER, he has normal vital signs.  Lab work reveals relatively stable hemoglobin 10, sodium 122, normal renal function, AST and ALT 340/210, normal bilirubin.  Notably, his albumin is low at 2.0, and total protein low at 6.0.  Review of Systems: Please see HPI for pertinent positives and negatives. A complete 10 system review of systems are otherwise negative.  Past Medical History:  Diagnosis Date   Allergic arthritis    Asthma, mild intermittent    followed by dr Elvera Lennox. Irena Cords ([place] [facility])   Coronary artery calcification seen on CAT scan 03/2014   cardiologist--- dr Rennis Golden;  calcium score=65.5 involving LAD   Dyspnea    due to weakness   Elevated liver enzymes    referred to [facility]--- dr Marca Ancona by pcp   Family history of adverse reaction to anesthesia    mother and  father--- ponv   Family history of premature CAD    NUCLEAR STRESS TEST, 12/30/2002 - no evidence of ischemia   GERD (gastroesophageal reflux disease)    Hepatic steatosis determined by biopsy of liver 2003   History of basal cell carcinoma (BCC) excision 08/18/2014   12/ 2018 nodular right chest tx; cx3 47fu;   03/ 2023  right breast (curet and 5FU)   History of kidney stones    urologist--- dr Mena Goes   Hyperlipidemia, mixed    followed by dr hilty   Muscle weakness (generalized)    02-05-2023  per pt using cane   NASH (nonalcoholic steatohepatitis)    followed by dr Marca Ancona  (GI)   Nocturia    RA (rheumatoid arthritis) [facility])    rheumatologist--- dr Dierdre Forth;  multiple sites   Raynaud's disease    Sciatica, right side    Wears glasses    Past Surgical History:  Procedure Laterality Date   CYSTOSCOPY W/ URETERAL STENT PLACEMENT  12/02/2011   Procedure: CYSTOSCOPY WITH RETROGRADE PYELOGRAM/URETERAL STENT PLACEMENT;  Surgeon: Kathi Ludwig, MD;  Location: [facility];  Service: Urology;  Laterality: Right;   CYSTOSCOPY W/ URETERAL STENT PLACEMENT  12/15/2011   Procedure: CYSTOSCOPY WITH STENT REPLACEMENT;  Surgeon: Milford Cage, MD;  Location: [facility];  Service: Urology;  Laterality: Right;  right reter stent removal and stent placement   CYSTOSCOPY/RETROGRADE/URETEROSCOPY  12/15/2011  Procedure: CYSTOSCOPY/RETROGRADE/URETEROSCOPY;  Surgeon: Milford Cage, MD;  Location: [facility];  Service: Urology;  Laterality: Right;  no retrograde   LIVER BIOPSY  05/29/2002   ORCHIOPEXY  1970   TONSILLECTOMY  1970   UPPER GI ENDOSCOPY  03/17/2002    Social History:  reports that he quit smoking about 44 years ago. His smoking use included cigarettes. He has never used smokeless tobacco. He reports that he does not currently use alcohol. He reports that he does not use drugs.   Allergies  Allergen Reactions   Mushroom Extract Complex Shortness Of Breath    All types mushrooms    Peanut-Containing Drug Products Shortness Of Breath    All nuts   Shellfish Allergy Shortness Of Breath and Hives    All shellfish   Morphine Other (See Comments)    REACTION: "makes my head feel like its stuffed; like it's going to pop"   Codeine Other (See Comments)    REACTION: "makes my head feel like its stuffed; like it's going to pop"   Morphine And Codeine     REACTION: "makes my head feel like its stuffed; like it's going to pop"    Family History  Problem Relation Age of Onset   Kidney Stones Mother    Arthritis Mother 57       Osteo   Heart disease Father 72   Heart attack Maternal Grandfather 40   Hyperlipidemia Brother      Prior to Admission medications   Medication Sig Start Date End Date Taking? Authorizing Provider  Abatacept 125 MG/ML SOSY Inject 125 mg into the skin once a week. On sundays    [provider]  Albuterol Sulfate (PROAIR RESPICLICK) 108 (90 Base) MCG/ACT AEPB Inhale 1 puff into the lungs every 6 (six) hours as needed (wheezing).    [provider]  aspirin EC 81 MG tablet Take 81 mg by mouth 3 (three) times a week. M/  W/  F  in the am    [provider]  AUVI-Q 0.3 MG/0.3ML SOAJ injection Inject 0.3 mg into the muscle as needed for anaphylaxis. 09/28/21   [provider]  azelastine (ASTELIN) 137 MCG/SPRAY nasal spray Place 2 sprays into the nose daily. Use in each nostril as directed     [provider]  BEPREVE 1.5 % SOLN Place 1 drop into both eyes 2 (two) times daily as needed for allergies. 10/25/17   [provider]  cetirizine (ZYRTEC) 10 MG tablet Take 10 mg by mouth at bedtime.     [provider]  esomeprazole (NEXIUM) 40 MG capsule Take 40 mg by mouth daily at 12 noon.    [provider]  famotidine (PEPCID) 20 MG tablet Take 20 mg by mouth 2 (two) times daily.    [provider]  fenofibrate 160 MG tablet Take 160 mg by mouth daily.    [provider]  fluticasone (FLONASE) 50 MCG/ACT nasal spray Place 2 sprays into the nose 2 (two) times daily.    [provider]  lansoprazole (PREVACID) 30 MG capsule Take 30 mg by mouth 2 (two) times daily before a meal.    [provider]  montelukast (SINGULAIR) 10 MG tablet Take 1 tablet by mouth at bedtime. 01/30/16   [provider]  Multiple Vitamin (MULITIVITAMIN WITH MINERALS) TABS Take 1 tablet by mouth daily.    [provider]  Pitavastatin Calcium (LIVALO) 2 MG TABS Take 2 mg by mouth  daily.    [provider]  Potassium Citrate 15 MEQ (1620 MG) TBCR Take 15 mEq by mouth daily. 01/14/13   [provider]  prednisoLONE 5 MG TABS tablet Take 5 mg by mouth daily.    [provider]  PRESCRIPTION MEDICATION Inject as directed once a week. Allergy shots weekly    [provider]  ramelteon (ROZEREM) 8 MG tablet Take 8 mg by mouth at bedtime as needed for sleep. For sleep    [provider]  sucralfate (CARAFATE) 1 g tablet Take 1 g by mouth 2 (two) times daily. 11/14/21   [provider]  triamcinolone cream (KENALOG) 0.1 % Apply topically daily. Patient taking differently: Apply 1 Application topically daily as needed. 07/20/20   Janalyn Harder, MD    Physical Exam: BP 124/75   Pulse 78   Temp 97.9 F (36.6 C) (Oral)   Resp 16   Wt 74 kg   SpO2 99%   BMI 24.09 kg/m   General:  Alert, oriented, calm, in no acute distress Eyes: EOMI, clear conjuctivae, white sclerea Neck: supple, no masses, trachea mildline  Cardiovascular: RRR, no murmurs or rubs, 2+ pitting bilateral lower extremity edema up to the bilateral mid thigh Respiratory: clear to auscultation bilaterally, no wheezes, no crackles  Abdomen: soft, nontender, nondistended, normal bowel tones heard  Skin: dry, no rashes  Musculoskeletal: no joint effusions, normal range of motion  Psychiatric: appropriate affect, normal speech  Neurologic:  extraocular muscles intact, clear speech, moving all extremities with intact sensorium          Labs on Admission:  Basic Metabolic Panel: Recent Labs  Lab 02/12/23 0837  NA 122*  K 4.3  CL 96*  CO2 21*  GLUCOSE 81  BUN 22*  CREATININE 0.80  CALCIUM 7.9*   Liver Function Tests: Recent Labs  Lab 02/08/23 0938 02/12/23 0837  AST  --  340*  ALT  --  210*  ALKPHOS  --  66  BILITOT  --  0.8  PROT WILL FOLLOW 6.0*  ALBUMIN  --  2.0*   No results for input(s): "LIPASE", "AMYLASE" in the last 168 hours. No results for input(s): "AMMONIA" in the last 168 hours. CBC: Recent Labs  Lab 02/12/23 0837  WBC 3.8*  NEUTROABS 1.4*  HGB 10.1*  HCT 29.7*  MCV 88.1  PLT 180   Cardiac Enzymes: Recent Labs  Lab 02/12/23 0837  CKTOTAL 1,088*    BNP (last 3 results) No results for input(s): "BNP" in the last 8760 hours.  ProBNP (last 3 results) No results for input(s): "PROBNP" in the last 8760 hours.  CBG: No results for input(s): "GLUCAP" in the last 168 hours.  Radiological Exams on Admission: CT Head Wo Contrast  Result Date: 02/12/2023 CLINICAL DATA:  [name] fall. Hit head on the concrete floor with hematoma and abrasion of the back of the head. EXAM: CT HEAD WITHOUT CONTRAST TECHNIQUE: Contiguous axial images were obtained from the base of the skull through the vertex without intravenous contrast. RADIATION DOSE REDUCTION: This exam was performed according to the departmental dose-optimization program which includes automated exposure control, adjustment of the mA and/or kV according to patient size and/or use of iterative reconstruction technique. COMPARISON:  None Available. FINDINGS: Brain: No evidence of acute infarction, hemorrhage, hydrocephalus, extra-axial collection or mass lesion/mass effect. Vascular: No hyperdense vessel or unexpected calcification. Skull: Right parietal scalp hematoma. No calvarial fracture focal bone lesion. Sinuses/Orbits: No acute finding.  Other: None. IMPRESSION:  1. No acute intracranial abnormality. 2. Right parietal scalp hematoma. No calvarial fracture. Electronically Signed   By: Larose Hires D.O.   On: 02/12/2023 09:58    Assessment/Plan This is a pleasant 57 year old gentleman with a history of well-controlled rheumatoid arthritis, hepatic steatosis with chronically abnormal LFTs, bilateral lower extremity proximal muscle weakness due to suspected myositis being admitted to the hospital with persistent hyponatremia after a fall at home earlier today.  Hyponatremia-overall his mental status is normal, however could be contributing to his fall today and some of his weakness.  Medications reviewed and no obvious culprit for causing his hyponatremia.  Could be due to increased oral fluid intake. -Observation admission -Hydrate gently with normal saline -Placed on 1800 cc fluid restriction per 24 hours -Check sodium level every 8 hours today  Fall-this seems to be related to his proximal lower extremity muscle weakness -Note outpatient incisional biopsy scheduled for 5/31 -PT consult  Myositis-with chronically elevated CK level, LFTs, etc. -Being followed outpatient by his rheumatologist, scheduled for incisional biopsy 5/31  Hypoalbuminemia-unclear etiology, as the patient states he has good appetite -Consider nutrition consult  Rheumatoid arthritis-no evidence of active flare  DVT prophylaxis: Lovenox     Code Status: Full Code  Consults called: None  Admission status: Observation  Time spent: 46 minutes  Lynton Crescenzo Sharlette Dense MD [facility] Pager [phone]  If 7PM-7AM, please contact night-coverage [email or web address] Password [facility]  02/12/2023, 12:22 PM

## 2023-02-12 NOTE — ED Notes (Signed)
ED TO INPATIENT HANDOFF REPORT  ED Nurse Name and Phone #: Lona Kettle Name/Age/Gender Kerrin Champagne 57 y.o. male Room/Bed: WA18/WA18  Code Status   Code Status: Prior  Home/SNF/Other Home Patient oriented to: self, place, time, and situation Is this baseline? Yes   Triage Complete: Triage complete  Chief Complaint Hyponatremia [E87.1]  Triage Note BIBA from home c/o mechanical fall.  Hit head on concrete floor with hematoma and abrasion to back of head.  Dizziness and nausea.  Denies blood thinner usage. Denies loc Cbg-96 Denies neck/back pain     Allergies Allergies  Allergen Reactions   Mushroom Extract Complex Shortness Of Breath    All types mushrooms   Peanut-Containing Drug Products Shortness Of Breath    All nuts   Shellfish Allergy Shortness Of Breath and Hives    All shellfish   Morphine Other (See Comments)    REACTION: "makes my head feel like its stuffed; like it's going to pop"   Codeine Other (See Comments)    REACTION: "makes my head feel like its stuffed; like it's going to pop"   Morphine And Codeine     REACTION: "makes my head feel like its stuffed; like it's going to pop"    Level of Care/Admitting Diagnosis ED Disposition     ED Disposition  Admit   Condition  --   Comment  Hospital Area: [facility] [[id number]]  Level of Care: Med-Surg [16]  May place patient in observation at [facility] or [facility] if equivalent level of care is available:: Yes  Covid Evaluation: Asymptomatic - no recent exposure (last 10 days) testing not required  Diagnosis: Hyponatremia [198519]  Admitting Physician: Maryln Gottron [[id number]]  Attending Physician: Kirby Crigler, MIR [email or web address] [[id number]]          B Medical/Surgery History Past Medical History:  Diagnosis Date   Allergic arthritis    Asthma, mild intermittent    followed by dr Elvera Lennox. Irena Cords ([place] [facility])   Coronary artery calcification seen on CAT scan 03/2014    cardiologist--- dr Rennis Golden;  calcium score=65.5 involving LAD   Dyspnea    due to weakness   Elevated liver enzymes    referred to [facility]--- dr Marca Ancona by pcp   Family history of adverse reaction to anesthesia    mother and father--- ponv   Family history of premature CAD    NUCLEAR STRESS TEST, 12/30/2002 - no evidence of ischemia   GERD (gastroesophageal reflux disease)    Hepatic steatosis determined by biopsy of liver 2003   History of basal cell carcinoma (BCC) excision 08/18/2014   12/ 2018 nodular right chest tx; cx3 22fu;   03/ 2023  right breast (curet and 5FU)   History of kidney stones    urologist--- dr Mena Goes   Hyperlipidemia, mixed    followed by dr hilty   Muscle weakness (generalized)    02-05-2023  per pt using cane   NASH (nonalcoholic steatohepatitis)    followed by dr Marca Ancona  (GI)   Nocturia    RA (rheumatoid arthritis) [facility])    rheumatologist--- dr Dierdre Forth;  multiple sites   Raynaud's disease    Sciatica, right side    Wears glasses    Past Surgical History:  Procedure Laterality Date   CYSTOSCOPY W/ URETERAL STENT PLACEMENT  12/02/2011   Procedure: CYSTOSCOPY WITH RETROGRADE PYELOGRAM/URETERAL STENT PLACEMENT;  Surgeon: Kathi Ludwig, MD;  Location: [facility];  Service: Urology;  Laterality:  Right;   CYSTOSCOPY W/ URETERAL STENT PLACEMENT  12/15/2011   Procedure: CYSTOSCOPY WITH STENT REPLACEMENT;  Surgeon: Milford Cage, MD;  Location: [facility];  Service: Urology;  Laterality: Right;  right reter stent removal and stent placement   CYSTOSCOPY/RETROGRADE/URETEROSCOPY  12/15/2011   Procedure: CYSTOSCOPY/RETROGRADE/URETEROSCOPY;  Surgeon: Milford Cage, MD;  Location: [facility];  Service: Urology;  Laterality: Right;  no retrograde   LIVER BIOPSY  05/29/2002   ORCHIOPEXY  1970   TONSILLECTOMY  1970   UPPER GI ENDOSCOPY  03/17/2002     A IV Location/Drains/Wounds Patient Lines/Drains/Airways Status     Active Line/Drains/Airways      Name Placement date Placement time Site Days   Peripheral IV 02/12/23 18 G Left Antecubital 02/12/23  0800  Antecubital  less than 1   Ureteral Drain/Stent Right ureter 6 Fr. 12/15/11  0833  Right ureter  4077            Intake/Output Last 24 hours No intake or output data in the 24 hours ending 02/12/23 1202  Labs/Imaging Results for orders placed or performed during the hospital encounter of 02/12/23 (from the past 48 hour(s))  Comprehensive metabolic panel     Status: Abnormal   Collection Time: 02/12/23  8:37 AM  Result Value Ref Range   Sodium 122 (L) 135 - 145 mmol/L   Potassium 4.3 3.5 - 5.1 mmol/L   Chloride 96 (L) 98 - 111 mmol/L   CO2 21 (L) 22 - 32 mmol/L   Glucose, Bld 81 70 - 99 mg/dL    Comment: Glucose reference range applies only to samples taken after fasting for at least 8 hours.   BUN 22 (H) 6 - 20 mg/dL   Creatinine, Ser 9.60 0.61 - 1.24 mg/dL   Calcium 7.9 (L) 8.9 - 10.3 mg/dL   Total Protein 6.0 (L) 6.5 - 8.1 g/dL   Albumin 2.0 (L) 3.5 - 5.0 g/dL   AST 454 (H) 15 - 41 U/L   ALT 210 (H) 0 - 44 U/L   Alkaline Phosphatase 66 38 - 126 U/L   Total Bilirubin 0.8 0.3 - 1.2 mg/dL   GFR, Estimated >09 >81 mL/min    Comment: (NOTE) Calculated using the CKD-EPI Creatinine Equation (2021)    Anion gap 5 5 - 15    Comment: Performed at [facility], [address] [place], [place] [address]  CBC with Differential     Status: Abnormal   Collection Time: 02/12/23  8:37 AM  Result Value Ref Range   WBC 3.8 (L) 4.0 - 10.5 K/uL   RBC 3.37 (L) 4.22 - 5.81 MIL/uL   Hemoglobin 10.1 (L) 13.0 - 17.0 g/dL   HCT 82.9 (L) 56.2 - 13.0 %   MCV 88.1 80.0 - 100.0 fL   MCH 30.0 26.0 - 34.0 pg   MCHC 34.0 30.0 - 36.0 g/dL   RDW 86.5 78.4 - 69.6 %   Platelets 180 150 - 400 K/uL   nRBC 0.5 (H) 0.0 - 0.2 %   Neutrophils Relative % 36 %   Neutro Abs 1.4 (L) 1.7 - 7.7 K/uL   Lymphocytes Relative 45 %   Lymphs Abs 1.8 0.7 - 4.0 K/uL   Monocytes Relative 12  %   Monocytes Absolute 0.4 0.1 - 1.0 K/uL   Eosinophils Relative 5 %   Eosinophils Absolute 0.2 0.0 - 0.5 K/uL   Basophils Relative 2 %   Basophils Absolute 0.1 0.0 - 0.1 K/uL  Immature Granulocytes 0 %   Abs Immature Granulocytes 0.00 0.00 - 0.07 K/uL    Comment: Performed at [facility], [address], [place], [place] [address]  CK     Status: Abnormal   Collection Time: 02/12/23  8:37 AM  Result Value Ref Range   Total CK 1,088 (H) 49 - 397 U/L    Comment: Performed at [facility], [address] [place], [place] [address]   CT Head Wo Contrast  Result Date: 02/12/2023 CLINICAL DATA:  [name] fall. Hit head on the concrete floor with hematoma and abrasion of the back of the head. EXAM: CT HEAD WITHOUT CONTRAST TECHNIQUE: Contiguous axial images were obtained from the base of the skull through the vertex without intravenous contrast. RADIATION DOSE REDUCTION: This exam was performed according to the departmental dose-optimization program which includes automated exposure control, adjustment of the mA and/or kV according to patient size and/or use of iterative reconstruction technique. COMPARISON:  None Available. FINDINGS: Brain: No evidence of acute infarction, hemorrhage, hydrocephalus, extra-axial collection or mass lesion/mass effect. Vascular: No hyperdense vessel or unexpected calcification. Skull: Right parietal scalp hematoma. No calvarial fracture focal bone lesion. Sinuses/Orbits: No acute finding. Other: None. IMPRESSION: 1. No acute intracranial abnormality. 2. Right parietal scalp hematoma. No calvarial fracture. Electronically Signed   By: Larose Hires D.O.   On: 02/12/2023 09:58    Pending Labs Unresulted Labs (From admission, onward)     Start     Ordered   02/12/23 1201  Creatinine, urine, random  Once,   R        02/12/23 1200   02/12/23 1033  Sodium, urine, random  Once,   URGENT        02/12/23 1032   02/12/23 1033   Osmolality  Once,   URGENT        02/12/23 1032            Vitals/Pain Today's Vitals   02/12/23 0711 02/12/23 0914 02/12/23 1048 02/12/23 1051  BP: 115/71 118/70 124/75   Pulse: 83 77 78   Resp: 17 16 16    Temp: 98 F (36.7 C)   97.9 F (36.6 C)  TempSrc: Oral   Oral  SpO2: 100% 100% 99%   Weight:      PainSc:        Isolation Precautions No active isolations  Medications Medications  lactated ringers bolus 500 mL (0 mLs Intravenous Stopped 02/12/23 1049)  ondansetron (ZOFRAN) injection 4 mg (4 mg Intravenous Given 02/12/23 0835)    Mobility walks     Focused Assessments [place]. Elevated CK   R Recommendations: See Admitting Provider Note  Report given to:   Additional Notes: .

## 2023-02-13 ENCOUNTER — Inpatient Hospital Stay (HOSPITAL_COMMUNITY): Payer: Managed Care, Other (non HMO)

## 2023-02-13 DIAGNOSIS — E871 Hypo-osmolality and hyponatremia: Secondary | ICD-10-CM | POA: Diagnosis present

## 2023-02-13 DIAGNOSIS — Z7982 Long term (current) use of aspirin: Secondary | ICD-10-CM | POA: Diagnosis not present

## 2023-02-13 DIAGNOSIS — K7581 Nonalcoholic steatohepatitis (NASH): Secondary | ICD-10-CM | POA: Diagnosis present

## 2023-02-13 DIAGNOSIS — Y9289 Other specified places as the place of occurrence of the external cause: Secondary | ICD-10-CM | POA: Diagnosis not present

## 2023-02-13 DIAGNOSIS — J4520 Mild intermittent asthma, uncomplicated: Secondary | ICD-10-CM | POA: Diagnosis present

## 2023-02-13 DIAGNOSIS — I5033 Acute on chronic diastolic (congestive) heart failure: Secondary | ICD-10-CM | POA: Diagnosis present

## 2023-02-13 DIAGNOSIS — E782 Mixed hyperlipidemia: Secondary | ICD-10-CM | POA: Diagnosis present

## 2023-02-13 DIAGNOSIS — Z87442 Personal history of urinary calculi: Secondary | ICD-10-CM | POA: Diagnosis not present

## 2023-02-13 DIAGNOSIS — Z79899 Other long term (current) drug therapy: Secondary | ICD-10-CM | POA: Diagnosis not present

## 2023-02-13 DIAGNOSIS — M6281 Muscle weakness (generalized): Secondary | ICD-10-CM | POA: Diagnosis present

## 2023-02-13 DIAGNOSIS — I7300 Raynaud's syndrome without gangrene: Secondary | ICD-10-CM | POA: Diagnosis present

## 2023-02-13 DIAGNOSIS — R233 Spontaneous ecchymoses: Secondary | ICD-10-CM | POA: Diagnosis present

## 2023-02-13 DIAGNOSIS — S0001XA Abrasion of scalp, initial encounter: Secondary | ICD-10-CM | POA: Diagnosis present

## 2023-02-13 DIAGNOSIS — Y9389 Activity, other specified: Secondary | ICD-10-CM | POA: Diagnosis not present

## 2023-02-13 DIAGNOSIS — K219 Gastro-esophageal reflux disease without esophagitis: Secondary | ICD-10-CM | POA: Diagnosis present

## 2023-02-13 DIAGNOSIS — R008 Other abnormalities of heart beat: Secondary | ICD-10-CM | POA: Diagnosis not present

## 2023-02-13 DIAGNOSIS — R75 Inconclusive laboratory evidence of human immunodeficiency virus [HIV]: Secondary | ICD-10-CM | POA: Diagnosis present

## 2023-02-13 DIAGNOSIS — I2510 Atherosclerotic heart disease of native coronary artery without angina pectoris: Secondary | ICD-10-CM | POA: Diagnosis present

## 2023-02-13 DIAGNOSIS — W108XXA Fall (on) (from) other stairs and steps, initial encounter: Secondary | ICD-10-CM | POA: Diagnosis present

## 2023-02-13 DIAGNOSIS — M609 Myositis, unspecified: Secondary | ICD-10-CM | POA: Diagnosis present

## 2023-02-13 DIAGNOSIS — R7989 Other specified abnormal findings of blood chemistry: Secondary | ICD-10-CM | POA: Diagnosis present

## 2023-02-13 DIAGNOSIS — E8809 Other disorders of plasma-protein metabolism, not elsewhere classified: Secondary | ICD-10-CM | POA: Diagnosis present

## 2023-02-13 DIAGNOSIS — S0003XA Contusion of scalp, initial encounter: Secondary | ICD-10-CM | POA: Diagnosis present

## 2023-02-13 DIAGNOSIS — Z8249 Family history of ischemic heart disease and other diseases of the circulatory system: Secondary | ICD-10-CM | POA: Diagnosis not present

## 2023-02-13 DIAGNOSIS — M069 Rheumatoid arthritis, unspecified: Secondary | ICD-10-CM | POA: Diagnosis present

## 2023-02-13 DIAGNOSIS — Z85828 Personal history of other malignant neoplasm of skin: Secondary | ICD-10-CM | POA: Diagnosis not present

## 2023-02-13 DIAGNOSIS — Z87891 Personal history of nicotine dependence: Secondary | ICD-10-CM | POA: Diagnosis not present

## 2023-02-13 DIAGNOSIS — Z8261 Family history of arthritis: Secondary | ICD-10-CM | POA: Diagnosis not present

## 2023-02-13 LAB — BASIC METABOLIC PANEL
Anion gap: 3 — ABNORMAL LOW (ref 5–15)
Anion gap: 5 (ref 5–15)
BUN: 25 mg/dL — ABNORMAL HIGH (ref 6–20)
BUN: 26 mg/dL — ABNORMAL HIGH (ref 6–20)
CO2: 21 mmol/L — ABNORMAL LOW (ref 22–32)
CO2: 22 mmol/L (ref 22–32)
Calcium: 8 mg/dL — ABNORMAL LOW (ref 8.9–10.3)
Calcium: 8.4 mg/dL — ABNORMAL LOW (ref 8.9–10.3)
Chloride: 99 mmol/L (ref 98–111)
Chloride: 97 mmol/L — ABNORMAL LOW (ref 98–111)
Creatinine, Ser: 0.89 mg/dL (ref 0.61–1.24)
Creatinine, Ser: 0.96 mg/dL (ref 0.61–1.24)
GFR, Estimated: >60 mL/min (ref >60)
GFR, Estimated: >60 mL/min (ref >60)
Glucose, Bld: 86 mg/dL (ref 70–99)
Glucose, Bld: 82 mg/dL (ref 70–99)
Potassium: 4.2 mmol/L (ref 3.5–5.1)
Potassium: 4.7 mmol/L (ref 3.5–5.1)
Sodium: 123 mmol/L — ABNORMAL LOW (ref 135–145)
Sodium: 124 mmol/L — ABNORMAL LOW (ref 135–145)

## 2023-02-13 LAB — ECHOCARDIOGRAM COMPLETE
AR max vel: 2.63 cm2
AV Area VTI: 2.8 cm2
AV Area mean vel: 2.66 cm2
AV Mean grad: 4 mmHg
AV Peak grad: 7.5 mmHg
Ao pk vel: 1.37 m/s
Area-P 1/2: 5.97 cm2
S' Lateral: 3.1 cm
Single Plane A4C EF: 73.7 %
Weight: 2610.25 oz

## 2023-02-13 LAB — URINALYSIS, ROUTINE W REFLEX MICROSCOPIC
Bilirubin Urine: NEGATIVE
Glucose, UA: NEGATIVE mg/dL
Ketones, ur: NEGATIVE mg/dL
Leukocytes,Ua: NEGATIVE
Nitrite: NEGATIVE
Protein, ur: NEGATIVE mg/dL
Specific Gravity, Urine: 1.012 (ref 1.005–1.030)
pH: 6 (ref 5.0–8.0)

## 2023-02-13 LAB — HEPATIC FUNCTION PANEL
ALT: 187 U/L — ABNORMAL HIGH (ref 0–44)
AST: 307 U/L — ABNORMAL HIGH (ref 15–41)
Albumin: 1.8 g/dL — ABNORMAL LOW (ref 3.5–5.0)
Alkaline Phosphatase: 67 U/L (ref 38–126)
Bilirubin, Direct: 0.1 mg/dL (ref 0.0–0.2)
Indirect Bilirubin: 0.6 mg/dL (ref 0.3–0.9)
Total Bilirubin: 0.7 mg/dL (ref 0.3–1.2)
Total Protein: 5.7 g/dL — ABNORMAL LOW (ref 6.5–8.1)

## 2023-02-13 LAB — NA AND K (SODIUM & POTASSIUM), RAND UR
Potassium Urine: 33 mmol/L
Sodium, Ur: 49 mmol/L

## 2023-02-13 LAB — CBC
HCT: 27.1 % — ABNORMAL LOW (ref 39.0–52.0)
Hemoglobin: 9.3 g/dL — ABNORMAL LOW (ref 13.0–17.0)
MCH: 30.6 pg (ref 26.0–34.0)
MCHC: 34.3 g/dL (ref 30.0–36.0)
MCV: 89.1 fL (ref 80.0–100.0)
Platelets: 159 10*3/uL (ref 150–400)
RBC: 3.04 MIL/uL — ABNORMAL LOW (ref 4.22–5.81)
RDW: 14.5 % (ref 11.5–15.5)
WBC: 3 10*3/uL — ABNORMAL LOW (ref 4.0–10.5)
nRBC: 0 % (ref 0.0–0.2)

## 2023-02-13 LAB — TSH: TSH: 9.18 u[IU]/mL — ABNORMAL HIGH (ref 0.350–4.500)

## 2023-02-13 LAB — PROTIME-INR
INR: 1.2 (ref 0.8–1.2)
Prothrombin Time: 15.6 seconds — ABNORMAL HIGH (ref 11.4–15.2)

## 2023-02-13 LAB — OSMOLALITY: Osmolality: 270 mOsm/kg — ABNORMAL LOW (ref 275–295)

## 2023-02-13 LAB — OSMOLALITY, URINE: Osmolality, Ur: 409 mOsm/kg (ref 300–900)

## 2023-02-13 LAB — HIV ANTIBODY (ROUTINE TESTING W REFLEX): HIV Screen 4th Generation wRfx: REACTIVE — AB

## 2023-02-13 LAB — BRAIN NATRIURETIC PEPTIDE: B Natriuretic Peptide: 77.4 pg/mL (ref 0.0–100.0)

## 2023-02-13 LAB — CORTISOL: Cortisol, Plasma: 7.4 ug/dL

## 2023-02-13 MED ORDER — FUROSEMIDE 10 MG/ML IJ SOLN
40.0000 mg | Freq: Once | INTRAMUSCULAR | Status: AC
  Administered 2023-02-13: 40 mg via INTRAVENOUS

## 2023-02-13 MED ORDER — FUROSEMIDE 10 MG/ML IJ SOLN
40.0000 mg | Freq: Once | INTRAMUSCULAR | Status: AC
  Administered 2023-02-13: 40 mg via INTRAVENOUS
  Filled 2023-02-13: qty 4

## 2023-02-13 MED ORDER — FUROSEMIDE 10 MG/ML IJ SOLN
40.0000 mg | Freq: Two times a day (BID) | INTRAMUSCULAR | Status: DC
  Administered 2023-02-13 – 2023-02-14 (×2): 40 mg via INTRAVENOUS
  Filled 2023-02-13: qty 4

## 2023-02-13 NOTE — [place] Notes (Signed)
Mobility Specialist - [place] Note   02/13/23 1135  Mobility  Activity Ambulated with assistance in hallway  Level of Assistance Modified independent, requires aide device or extra time  Assistive Device Cane  Distance Ambulated (ft) 700 ft  Activity Response Tolerated well  Mobility Referral Yes  $Mobility charge 1 Mobility  Mobility Specialist Start Time (ACUTE ONLY) 1125  Mobility Specialist Stop Time (ACUTE ONLY) 1134  Mobility Specialist Time Calculation (min) (ACUTE ONLY) 9 min   Pt received in recliner and agreeable to mobility. No complaints during session. Pt to recliner after session with all needs met.    [facility]

## 2023-02-13 NOTE — [place] Notes (Signed)
PT Cancellation Note  Patient Details Name: Stuart Collins MRN: [id number] DOB: 1965-09-27   Cancelled Treatment:    Reason Eval/Treat Not Completed: PT screened, no needs identified, will sign off  Spoke with patient about safe strategies for negotiating his flight of steps. Patient has a cane, does not feel need for a RW. Patient hopeful for a diagnosis and treatment. Blanchard Kelch PT Acute Rehabilitation Services Office [phone] Weekend [phone]    Stuart Collins 02/13/2023, 11:09 AM

## 2023-02-13 NOTE — [place] Notes (Signed)
  Echocardiogram 2D Echocardiogram has been performed.  Gen Clagg Wynn Banker 02/13/2023, 2:34 PM

## 2023-02-13 NOTE — Progress Notes (Signed)
PROGRESS NOTE    Stuart Collins  ZOX:[id number] DOB: 1966/06/15 DOA: 02/12/2023 PCP: Ralene Ok, MD  Chief Complaint  Patient presents with   Fall    Brief Narrative:    Stuart Collins is Stuart Collins 57 y.o. male with medical history significant for rheumatoid arthritis and myositis of unclear etiology being admitted to the hospital with hyponatremia after Stuart Collins fall at home.   Assessment & Plan:   Principal Problem:   Hyponatremia  Hypervolemic Hyponatremia Exam c/w hypervolemia  Urine sodium, urine osms, serum osms TSH, cortisol  Strict I/O, daily weights Lasix BID  Lower Extremity Edema  Hypoalbuminemia  UA 5/13 with only trace protein Repeat UA pending Hx hepatic steatosis, will follow INR Echo pending Diuresis as above  Fall-this seems to be related to his proximal lower extremity muscle weakness, lower extremity edema could contribute -Note outpatient incisional biopsy scheduled for 5/31 -PT consult   Concern for Myositis-with chronically elevated CK level, LFTs, etc. -Being followed outpatient by his rheumatologist, scheduled for incisional biopsy 5/31   Rheumatoid arthritis-no evidence of active flare On abatacept outpatient     DVT prophylaxis: lovenox Code Status: full Family Communication: none Disposition:   Status is: Observation The patient will require care spanning > 2 midnights and should be moved to inpatient because: need for IV diuresis, echo   Consultants:  none  Procedures:  none  Antimicrobials:  Anti-infectives (From admission, onward)    None       Subjective: No new complaints  Objective: Vitals:   02/12/23 2036 02/13/23 0030 02/13/23 0508 02/13/23 0857  BP: 133/82 128/80 124/81 120/75  Pulse: 82 78 70 78  Resp: 18 16 16 18   Temp: 98.3 F (36.8 C) 98 F (36.7 C) 97.8 F (36.6 C) 97.7 F (36.5 C)  TempSrc:   Oral Oral  SpO2: 99% 98% 100% 100%  Weight:        Intake/Output Summary (Last 24 hours) at 02/13/2023 1142 Last  data filed at 02/12/2023 2330 Gross per 24 hour  Intake 129.83 ml  Output --  Net 129.83 ml   Filed Weights   02/12/23 0709  Weight: 74 kg    Examination:  General exam: Appears calm and comfortable  Respiratory system: Clear to auscultation. Respiratory effort normal. Cardiovascular system: S1 & S2 heard, RRR. Gastrointestinal system: Abdomen is nondistended, soft and nontender.  Central nervous system: Alert and oriented. No focal neurological deficits. Extremities: bilateral LE edema to thighs, sacral edema present   Data Reviewed: I have personally reviewed following labs and imaging studies  CBC: Recent Labs  Lab 02/12/23 0837 02/13/23 0400  WBC 3.8* 3.0*  NEUTROABS 1.4*  --   HGB 10.1* 9.3*  HCT 29.7* 27.1*  MCV 88.1 89.1  PLT 180 159    Basic Metabolic Panel: Recent Labs  Lab 02/12/23 0837 02/12/23 1823 02/13/23 0400  NA 122* 123* 123*  K 4.3 4.7 4.2  CL 96* 98 99  CO2 21* 22 21*  GLUCOSE 81 86 86  BUN 22* 25* 25*  CREATININE 0.80 0.89 0.89  CALCIUM 7.9* 8.3* 8.0*    GFR: Estimated Creatinine Clearance: 90.5 mL/min (by C-G formula based on SCr of 0.89 mg/dL).  Liver Function Tests: Recent Labs  Lab 02/08/23 0938 02/12/23 0837 02/13/23 0400  AST  --  340* 307*  ALT  --  210* 187*  ALKPHOS  --  66 67  BILITOT  --  0.8 0.7  PROT WILL FOLLOW 6.0* 5.7*  ALBUMIN  --  2.0* 1.8*    CBG: No results for input(s): "GLUCAP" in the last 168 hours.   No results found for this or any previous visit (from the past 240 hour(s)).       Radiology Studies: CT Head Wo Contrast  Result Date: 02/12/2023 CLINICAL DATA:  [name] fall. Hit head on the concrete floor with hematoma and abrasion of the back of the head. EXAM: CT HEAD WITHOUT CONTRAST TECHNIQUE: Contiguous axial images were obtained from the base of the skull through the vertex without intravenous contrast. RADIATION DOSE REDUCTION: This exam was performed according to the departmental  dose-optimization program which includes automated exposure control, adjustment of the mA and/or kV according to patient size and/or use of iterative reconstruction technique. COMPARISON:  None Available. FINDINGS: Brain: No evidence of acute infarction, hemorrhage, hydrocephalus, extra-axial collection or mass lesion/mass effect. Vascular: No hyperdense vessel or unexpected calcification. Skull: Right parietal scalp hematoma. No calvarial fracture focal bone lesion. Sinuses/Orbits: No acute finding. Other: None. IMPRESSION: 1. No acute intracranial abnormality. 2. Right parietal scalp hematoma. No calvarial fracture. Electronically Signed   By: Larose Hires D.O.   On: 02/12/2023 09:58        Scheduled Meds:  aspirin EC  81 mg Oral Q M,W,F   enoxaparin (LOVENOX) injection  40 mg Subcutaneous Q24H   famotidine  20 mg Oral BID   fluticasone  2 spray Each Nare BID   furosemide  40 mg Intravenous Once   furosemide  40 mg Intravenous Once   loratadine  10 mg Oral QHS   montelukast  10 mg Oral QHS   sucralfate  1 g Oral BID   Continuous Infusions:   LOS: 0 days    Time spent: over 30 min    [name], MD [facility]   To contact the attending provider between 7A-7P or the covering provider during after hours 7P-7A, please log into the web site [email or web address] and access using universal [place] password for that web site. If you do not have the password, please call the hospital operator.  02/13/2023, 11:42 AM

## 2023-02-13 NOTE — TOC CM/SW Note (Signed)
Transition of Care [facility]) - Inpatient Brief Assessment   Patient Details  Name: Stuart Collins MRN: [id number] Date of Birth: January 08, 1966  [facility] CM/SW Contact:    Durenda Guthrie, RN Phone Number: 02/13/2023, 9:37 AM   Clinical Narrative:    Transition of Care Asessment: Insurance and Status: Insurance coverage has been reviewed Patient has primary care physician: Yes Loralie Champagne) Home environment has been reviewed: lives alone in a town house, uses cane, all bedrooms Prior level of function:: independent, until past two months. had a fall in January, has difficulty ambulating stairs   Social Determinants of Health Reivew: SDOH reviewed no interventions necessary Readmission risk has been reviewed: Yes Transition of care needs: transition of care needs identified, TOC will continue to follow (will need PT/OT eval)

## 2023-02-13 NOTE — Consult Note (Signed)
[facility]    Date of Admission:  02/12/2023           Reason for Consult:  HIV screening test positive Principal Problem:   Hyponatremia   Assessment: 57 year old male admitted with hyponatremia following a fall found HIV screen positive ID engaged:  #HIV screening test positive #Remote history of MSM #RA on [place] x 10 years per pt #Myositis - Patient states he was sexually active with receptive and insertive anal sex about 19 to 20 years ago.  He does not recall the exact date of his last HIV test but he thinks it was about 20 years ago as well.  I discussed that he has confirmation pending.  He states he does not want to start "any medications" for HIV until results are final.  I given appoint with myself on 6/10 at 9:30 AM.  Declined a visit next week. - He is followed by rheumatology Dr. Dierdre Forth  Recommendations:  Follow-up with infectious disease myself on 6/10 to discuss HIV differentiation, viral load results if patient is discharged prior to confirmation. -Hep screen, RPR, GC urine today   #History of elevated transaminitis-chronically - Reviewed note from 12/04/2017 where provider created with GI.  Noted that transaminitis likely related to fatty liver. - Patient reports he is planning on GI follow-up outpatient. Microbiology:      HPI: ZEPHANIAH SHEAHAN is a 57 y.o. male with past medical history of rheumatoid arthritis and myositis of unclear etiology plan on biopsy on Friday admitted for hyponatremia after a fall.  Patient stated he had a fall back in January and again on day of admission while taking trash out.  On arrival to the ED he had sodium 123.  LFTs elevated AST/ALT 340/210.  As part of general screening HIV test returned positive.  ID engaged.   Review of Systems: Review of Systems  All other systems reviewed and are negative.   Past Medical History:  Diagnosis Date   Allergic arthritis    Asthma, mild intermittent     followed by dr Elvera Lennox. Irena Cords ( allergy/ asthma center)   Coronary artery calcification seen on CAT scan 03/2014   cardiologist--- dr Rennis Golden;  calcium score=65.5 involving LAD   Dyspnea    due to weakness   Elevated liver enzymes    referred to [facility] GI--- dr Marca Ancona by pcp   Family history of adverse reaction to anesthesia    mother and father--- ponv   Family history of premature CAD    NUCLEAR STRESS TEST, 12/30/2002 - no evidence of ischemia   GERD (gastroesophageal reflux disease)    Hepatic steatosis determined by biopsy of liver 2003   History of basal cell carcinoma (BCC) excision 08/18/2014   12/ 2018 nodular right chest tx; cx3 65fu;   03/ 2023  right breast (curet and 5FU)   History of kidney stones    urologist--- dr Mena Goes   Hyperlipidemia, mixed    followed by dr hilty   Muscle weakness (generalized)    02-05-2023  per pt using cane   NASH (nonalcoholic steatohepatitis)    followed by dr Marca Ancona  (GI)   Nocturia    RA (rheumatoid arthritis) [facility])    rheumatologist--- dr Dierdre Forth;  multiple sites   Raynaud's disease    Sciatica, right side    Wears glasses     Social History   Tobacco Use   Smoking status: Former  Years: 4    Types: Cigarettes    Quit date: 35    Years since quitting: 44.4   Smokeless tobacco: Never  Vaping Use   Vaping Use: Never used  Substance Use Topics   Alcohol use: Not Currently   Drug use: Never    Family History  Problem Relation Age of Onset   Kidney Stones Mother    Arthritis Mother 64       Osteo   Heart disease Father 62   Heart attack Maternal Grandfather 30   Hyperlipidemia Brother    Scheduled Meds:  aspirin EC  81 mg Oral Q M,W,F   enoxaparin (LOVENOX) injection  40 mg Subcutaneous Q24H   famotidine  20 mg Oral BID   fluticasone  2 spray Each Nare BID   furosemide  40 mg Intravenous Once   furosemide  40 mg Intravenous Once   furosemide  40 mg Intravenous BID   loratadine  10 mg Oral QHS    montelukast  10 mg Oral QHS   sucralfate  1 g Oral BID   Continuous Infusions: PRN Meds:.albuterol, ibuprofen, ondansetron **OR** ondansetron (ZOFRAN) IV, senna-docusate Allergies  Allergen Reactions   Mushroom Extract Complex Shortness Of Breath    All types mushrooms   Peanut-Containing Drug Products Shortness Of Breath    All nuts   Shellfish Allergy Shortness Of Breath and Hives    All shellfish   Morphine Other (See Comments)    REACTION: "makes my head feel like its stuffed; like it's going to pop"   Codeine Other (See Comments)    REACTION: "makes my head feel like its stuffed; like it's going to pop"   Morphine And Codeine     REACTION: "makes my head feel like its stuffed; like it's going to pop"    OBJECTIVE: Blood pressure 120/75, pulse 78, temperature 97.7 F (36.5 C), temperature source Oral, resp. rate 18, weight 74 kg, SpO2 100 %.  Physical Exam Constitutional:      General: He is not in acute distress.    Appearance: He is normal weight. He is not toxic-appearing.  HENT:     Head: Normocephalic and atraumatic.     Right Ear: External ear normal.     Left Ear: External ear normal.     Nose: No congestion or rhinorrhea.     Mouth/Throat:     Mouth: Mucous membranes are moist.     Pharynx: Oropharynx is clear.  Eyes:     Extraocular Movements: Extraocular movements intact.     Conjunctiva/sclera: Conjunctivae normal.     Pupils: Pupils are equal, round, and reactive to light.  Cardiovascular:     Rate and Rhythm: Normal rate and regular rhythm.     Heart sounds: No murmur heard.    No friction rub. No gallop.  Pulmonary:     Effort: Pulmonary effort is normal.     Breath sounds: Normal breath sounds.  Abdominal:     General: Abdomen is flat. Bowel sounds are normal.     Palpations: Abdomen is soft.  Musculoskeletal:        General: No swelling. Normal range of motion.     Cervical back: Normal range of motion and neck supple.  Skin:    General: Skin  is warm and dry.  Neurological:     General: No focal deficit present.     Mental Status: He is oriented to person, place, and time.  Psychiatric:  Mood and Affect: Mood normal.     Lab Results Lab Results  Component Value Date   WBC 3.0 (L) 02/13/2023   HGB 9.3 (L) 02/13/2023   HCT 27.1 (L) 02/13/2023   MCV 89.1 02/13/2023   PLT 159 02/13/2023    Lab Results  Component Value Date   CREATININE 0.89 02/13/2023   BUN 25 (H) 02/13/2023   NA 123 (L) 02/13/2023   K 4.2 02/13/2023   CL 99 02/13/2023   CO2 21 (L) 02/13/2023    Lab Results  Component Value Date   ALT 187 (H) 02/13/2023   AST 307 (H) 02/13/2023   ALKPHOS 67 02/13/2023   BILITOT 0.7 02/13/2023       Danelle Earthly, MD [facility] 02/13/2023, 2:57 PM   I have personally spent 65 minutes involved in face-to-face and non-face-to-face activities for this patient on the day of the visit. Professional time spent includes the following activities: Preparing to see the patient (review of tests), Obtaining and/or reviewing separately obtained history (admission/discharge record), Performing a medically appropriate examination and/or evaluation , Ordering medications/tests/procedures, referring and communicating with other health care professionals, Documenting clinical information in the EMR, Independently interpreting results (not separately reported), Communicating results to the patient/family/caregiver, Counseling and educating the patient/family/caregiver and Care coordination (not separately reported).

## 2023-02-14 DIAGNOSIS — E871 Hypo-osmolality and hyponatremia: Secondary | ICD-10-CM | POA: Diagnosis not present

## 2023-02-14 LAB — MAGNESIUM: Magnesium: 1.7 mg/dL (ref 1.7–2.4)

## 2023-02-14 LAB — BASIC METABOLIC PANEL
Anion gap: 6 (ref 5–15)
BUN: 28 mg/dL — ABNORMAL HIGH (ref 6–20)
CO2: 20 mmol/L — ABNORMAL LOW (ref 22–32)
Calcium: 8 mg/dL — ABNORMAL LOW (ref 8.9–10.3)
Chloride: 99 mmol/L (ref 98–111)
Creatinine, Ser: 0.98 mg/dL (ref 0.61–1.24)
GFR, Estimated: >60 mL/min (ref >60)
Glucose, Bld: 79 mg/dL (ref 70–99)
Potassium: 3.9 mmol/L (ref 3.5–5.1)
Sodium: 125 mmol/L — ABNORMAL LOW (ref 135–145)

## 2023-02-14 LAB — CBC
HCT: 27.1 % — ABNORMAL LOW (ref 39.0–52.0)
Hemoglobin: 9.5 g/dL — ABNORMAL LOW (ref 13.0–17.0)
MCH: 31 pg (ref 26.0–34.0)
MCHC: 35.1 g/dL (ref 30.0–36.0)
MCV: 88.6 fL (ref 80.0–100.0)
Platelets: 192 10*3/uL (ref 150–400)
RBC: 3.06 MIL/uL — ABNORMAL LOW (ref 4.22–5.81)
RDW: 14.5 % (ref 11.5–15.5)
WBC: 3.4 10*3/uL — ABNORMAL LOW (ref 4.0–10.5)
nRBC: 0.6 % — ABNORMAL HIGH (ref 0.0–0.2)

## 2023-02-14 LAB — URINE CYTOLOGY ANCILLARY ONLY
Chlamydia: NEGATIVE
Comment: NEGATIVE
Comment: NORMAL
Neisseria Gonorrhea: NEGATIVE

## 2023-02-14 LAB — T4, FREE: Free T4: 0.93 ng/dL (ref 0.61–1.12)

## 2023-02-14 LAB — HIV-1 RNA QUANT-NO REFLEX-BLD
HIV 1 RNA Quant: <20 copies/mL
LOG10 HIV-1 RNA: UNDETERMINED log10copy/mL

## 2023-02-14 LAB — PHOSPHORUS: Phosphorus: 5.2 mg/dL — ABNORMAL HIGH (ref 2.5–4.6)

## 2023-02-14 LAB — PROTIME-INR
INR: 1.3 — ABNORMAL HIGH (ref 0.8–1.2)
Prothrombin Time: 16.4 seconds — ABNORMAL HIGH (ref 11.4–15.2)

## 2023-02-14 LAB — RPR: RPR Ser Ql: NONREACTIVE

## 2023-02-14 LAB — ALBUMIN: Albumin: 1.8 g/dL — ABNORMAL LOW (ref 3.5–5.0)

## 2023-02-14 MED ORDER — FUROSEMIDE 40 MG PO TABS: 40.0000 mg | ORAL_TABLET | Freq: Once | ORAL | 1 refills | Status: DC

## 2023-02-14 MED ORDER — FUROSEMIDE 40 MG PO TABS: 40.0000 mg | ORAL_TABLET | Freq: Once | ORAL | Status: DC

## 2023-02-14 MED ORDER — MAGNESIUM SULFATE 2 GM/50ML IV SOLN
2.0000 g | Freq: Once | INTRAVENOUS | Status: AC
  Administered 2023-02-14: 2 g via INTRAVENOUS
  Filled 2023-02-14: qty 50

## 2023-02-14 NOTE — Progress Notes (Signed)
Pt discharging home with no needs. Pt is alert and oriented. Pt belongings were packed and sent home with the pt. AVS was given and explained. Pt given time to ask questions. IV was discontinued. Pt escorted to main entrance via wheelchair by NT.

## 2023-02-14 NOTE — Plan of Care (Signed)
  Problem: Clinical Measurements: Goal: Ability to maintain clinical measurements within normal limits will improve Outcome: Progressing   Problem: Clinical Measurements: Goal: Will remain free from infection Outcome: Progressing   Problem: Pain Managment: Goal: General experience of comfort will improve Outcome: Progressing   Problem: Safety: Goal: Ability to remain free from injury will improve Outcome: Progressing   Problem: Skin Integrity: Goal: Risk for impaired skin integrity will decrease Outcome: Progressing   

## 2023-02-14 NOTE — Discharge Summary (Signed)
Physician Discharge Summary  Stuart Collins ZOX:[id number] DOB: [birth year] DOA: 02/12/2023  PCP: Stuart Ok, MD  Admit date: 02/12/2023 Discharge date: 02/14/2023  Time spent: 40 minutes  Recommendations for Outpatient Follow-up:  Follow outpatient CBC/CMP  Trend sodium, repeat within 1 week  Avoid significant IVF during surgery tomorrow to avoid worsening his volume overload (and as Stuart Collins result his sodium) Continue lasix outpatient, adjust as needed for volume overload Abnormal TSH, free T4 pending - follow outpatient and repeat outpatient Equivocal cortisol, consider repeating after discharge Follow abnormal LFT's outpatient, needs GI follow up  Discharge Diagnoses:  Principal Problem:   Hyponatremia   Discharge Condition: stable  Diet recommendation: heart healthy  Filed Weights   02/12/23 0709 02/13/23 2130 02/14/23 0450  Weight: 74 kg 72.3 kg 72.4 kg    History of present illness:   Stuart Collins is Stuart Collins 57 y.o. male with medical history significant for rheumatoid arthritis and myositis of unclear etiology being admitted to the hospital with hyponatremia after Stuart Collins fall at home.   He's been following with rheumatology for suspected myositis outpatient and has Stuart Collins pending muscle biopsy on 5/31.    Diagnosed with hypervolemic hyponatremia, he's improved modestly with IV lasix.  Plan to discharge with oral lasix and plan to repeat labs outpatient.  Will prioritize this pending biopsy which will help with diagnosis of his suspected myositis.  See below for additional details  Hospital Course:  Assessment and Plan:  Hypervolemic Hyponatremia Exam c/w hypervolemia  TSH elevated (free T4 pending), cortisol (equivocal, follow outpatient) Strict I/O, daily weights Continue lasix 40 mg daily at discharge, follow repeat labs within 1 week   Lower Extremity Edema  Hypoalbuminemia  HFpEF Exacerbation UA 5/13 with only trace protein Repeat UA without protein Hx hepatic steatosis, will  follow INR Echo with preserved EF (see report) No respiratory symptoms  Diuresis as above -> will discharge with 40 mg lasix daily, continue home potassium - follow with outpatient provider, repeat labs within 1 week    Fall-this seems to be related to his proximal lower extremity muscle weakness, lower extremity edema could contribute -Note outpatient incisional biopsy scheduled for 5/31 -PT consult  Concern for Myositis-with chronically elevated CK level, LFTs, etc. -Being followed outpatient by his rheumatologist, scheduled for incisional biopsy 5/31 -- will discharge today so he can get to this planned procedure tomorrow, ideally would diurese Stuart Collins little more prior to discharge and see his sodium return Stuart Collins bit closer to normal, but I think the myositis has been ongoing for some time and sodium also has been relatively chronically low (5/13 it was 123 as well) I think Collins for him to follow up for his procedure tomorrow and follow up with outpatient PCP on lasix.     Rheumatoid arthritis-no evidence of active flare On abatacept outpatient  Follow with rheum outpatient  Positive HIV Screening test Pending confirmatory test Will follow with ID outpatient   Stuart Collins Unclear cause at this time, platelets are wnl  Hold aspirin and ibuprofen for now, follow outpatient with PCP       Procedures: Echo IMPRESSIONS     1. Left ventricular ejection fraction, by estimation, is 65 to 70%. The  left ventricle has normal function. The left ventricle has no regional  wall motion abnormalities. Left ventricular diastolic parameters were  normal.   2. Right ventricular systolic function is normal. The right ventricular  size is mildly enlarged.   3. The mitral valve is normal in structure. Mild  mitral valve  regurgitation. No evidence of mitral stenosis.   4. The aortic valve is tricuspid. Aortic valve regurgitation is not  visualized. No aortic stenosis is present.   5. The inferior vena  cava is normal in size with greater than 50%  respiratory variability, suggesting right atrial pressure of 3 mmHg.   Comparison(s): Function is more dynamic from 2013 report.    Consultations: Infectious disease  Discharge Exam: Vitals:   02/14/23 0452 02/14/23 1235  BP: 126/75 (!) 142/85  Pulse: 70 85  Resp: 17   Temp: 97.8 F (36.6 C) 97.9 F (36.6 C)  SpO2: 100% 100%   Eager to discharge Asking about Stuart Collins on legs  General: No acute distress. Lungs: unlabored Neurological: Alert and oriented 3. Moves all extremities 4 with equal strength. Cranial nerves II through XII grossly intact. Skin: Stuart Collins to bilateral LE's, R>L Extremities: improved LE edema, but persistent  Discharge Instructions   Discharge Instructions     (HEART FAILURE PATIENTS) Call MD:  Anytime you have any of the following symptoms: 1) 3 pound weight gain in 24 hours or 5 pounds in 1 week 2) shortness of breath, with or without Stuart Collins dry hacking cough 3) swelling in the hands, feet or stomach 4) if you have to sleep on extra pillows at night in order to breathe.   Complete by: As directed    Call MD for:  difficulty breathing, headache or visual disturbances   Complete by: As directed    Call MD for:  extreme fatigue   Complete by: As directed    Call MD for:  hives   Complete by: As directed    Call MD for:  persistant dizziness or light-headedness   Complete by: As directed    Call MD for:  persistant nausea and vomiting   Complete by: As directed    Call MD for:  redness, tenderness, or signs of infection (pain, swelling, redness, odor or green/yellow discharge around incision site)   Complete by: As directed    Call MD for:  severe uncontrolled pain   Complete by: As directed    Call MD for:  temperature >100.4   Complete by: As directed    Diet - low sodium heart healthy   Complete by: As directed    Discharge instructions   Complete by: As directed    You were seen for low sodium  and Stuart Collins fall.  I think your low sodium is related to the volume overload (fluid overload or swelling) that you have.  This is due to your low albumin (low protein).  Your heart shows Shalon Salado normal pump or squeeze, there could be Kitti Mcclish component of heart failure with preserved ejection fraction as well.    Continue the lasix 40 mg daily with your prior to admission potassium.  Repeat labs within 1 week with rheumatology or your PCP.    Once your swelling is resolved, check your weight (this is your dry weight).  Discuss this plan with your PCP.  Check your weights daily after this.  If your weight increases by more than 2-3 lbs in Melinda Pottinger day or 5 lbs in Mahlon Gabrielle week, call your PCP to discuss how to take your lasix and whether to come in for Eidan Muellner visit.  You have Stuart Collins in your legs.  Hold your aspirin and stop your ibuprofen for now.  Follow up with your PCP or rheumatology to discuss whether you need additional workup regarding this.  If this worsens, return  to care.    Follow up as scheduled with your planned biopsy.  You have Graclynn Vanantwerp few pending labs at the time of discharge.  Your HIV screen was positive.  The confirmatory results are pending.  Infectious disease should touch base with you.  Follow up with infectious disease outpatient as scheduled.    Your thyroid function was abnormal.  There's Gabriella Woodhead pending free T4 test.  Please follow the results of this with your rheumatologist or PCP outpatient, these results will probably need to be repeated.  Your cortisol level was equivocal.  Discuss whether this needs to be repeated with your PCP outpatient.  Return for new, recurrent, or worsening symptoms.  Please ask your PCP to request records from this hospitalization so they know what was done and what the next steps will be.   Return for new, recurrent, or worsening symptoms.  Please ask your PCP to request records from this hospitalization so they know what was done and what the next steps will be.   Increase  activity slowly   Complete by: As directed       Allergies as of 02/14/2023       Reactions   Mushroom Extract Complex Shortness Of Breath   All types mushrooms   Peanut-containing Drug Products Shortness Of Breath   All nuts   Shellfish Allergy Shortness Of Breath, Hives   All shellfish   Morphine Other (See Comments)   REACTION: "makes my head feel like its stuffed; like it's going to pop"   Codeine Other (See Comments)   REACTION: "makes my head feel like its stuffed; like it's going to pop"   Morphine And Codeine    REACTION: "makes my head feel like its stuffed; like it's going to pop"        Medication List     STOP taking these medications    aspirin EC 81 MG tablet   famotidine 20 MG tablet Commonly known as: PEPCID       TAKE these medications    Abatacept 125 MG/ML Sosy Inject 125 mg into the skin once Caliber Landess week. On sundays   Albuterol Sulfate 108 (90 Base) MCG/ACT Aepb Commonly known as: PROAIR RESPICLICK Inhale 1 puff into the lungs every 6 (six) hours as needed (wheezing).   Auvi-Q 0.3 mg/0.3 mL Soaj injection Generic drug: EPINEPHrine Inject 0.3 mg into the muscle as needed for anaphylaxis.   azelastine 0.1 % nasal spray Commonly known as: ASTELIN Place 2 sprays into the nose daily. Use in each nostril as directed   Bepreve 1.5 % Soln Generic drug: Bepotastine Besilate Place 1 drop into both eyes 2 (two) times daily as needed for allergies.   cetirizine 10 MG tablet Commonly known as: ZYRTEC Take 10 mg by mouth at bedtime.   fluticasone 50 MCG/ACT nasal spray Commonly known as: FLONASE Place 2 sprays into the nose 2 (two) times daily.   furosemide 40 MG tablet Commonly known as: LASIX Take 1 tablet (40 mg total) by mouth once for 1 dose. Follow up with your PCP or rheumatologist for repeat labs within 1 week   lansoprazole 30 MG capsule Commonly known as: PREVACID Take 30 mg by mouth 2 (two) times daily before Kadence Mikkelson meal.   montelukast  10 MG tablet Commonly known as: SINGULAIR Take 1 tablet by mouth at bedtime.   multivitamin with minerals Tabs tablet Take 1 tablet by mouth daily.   Potassium Citrate 15 MEQ (1620 MG) Tbcr Take 15 mEq by mouth  daily.   ramelteon 8 MG tablet Commonly known as: ROZEREM Take 8 mg by mouth at bedtime as needed for sleep. For sleep   sucralfate 1 g tablet Commonly known as: CARAFATE Take 1 g by mouth 2 (two) times daily.   triamcinolone cream 0.1 % Commonly known as: KENALOG Apply topically daily. What changed:  how much to take when to take this reasons to take this       Allergies  Allergen Reactions   Mushroom Extract Complex Shortness Of Breath    All types mushrooms   Peanut-Containing Drug Products Shortness Of Breath    All nuts   Shellfish Allergy Shortness Of Breath and Hives    All shellfish   Morphine Other (See Comments)    REACTION: "makes my head feel like its stuffed; like it's going to pop"   Codeine Other (See Comments)    REACTION: "makes my head feel like its stuffed; like it's going to pop"   Morphine And Codeine     REACTION: "makes my head feel like its stuffed; like it's going to pop"      The results of significant diagnostics from this hospitalization (including imaging, microbiology, ancillary and laboratory) are listed below for reference.    Significant Diagnostic Studies: ECHOCARDIOGRAM COMPLETE  Result Date: 02/13/2023    ECHOCARDIOGRAM REPORT   Patient Name:   LIZANDRO HINTON Date of Exam: 02/13/2023 Medical Rec #:  [id number]    Height:       69.0 in Accession #:    [id number]   Weight:       163.1 lb Date of Birth:  [birth year]     BSA:          1.895 m Patient Age:    56 years     BP:           120/75 mmHg Patient Gender: M            HR:           86 bpm. Exam Location:  Inpatient Procedure: 2D Echo, Cardiac Doppler and Color Doppler Indications:    Other abnormalities of the heart.  History:        Patient has prior history of  Echocardiogram examinations, most                 recent 12/02/2011.  Sonographer:    Lucy Antigua Referring Phys: [phone] Taffy Delconte CALDWELL POWELL JR IMPRESSIONS  1. Left ventricular ejection fraction, by estimation, is 65 to 70%. The left ventricle has normal function. The left ventricle has no regional wall motion abnormalities. Left ventricular diastolic parameters were normal.  2. Right ventricular systolic function is normal. The right ventricular size is mildly enlarged.  3. The mitral valve is normal in structure. Mild mitral valve regurgitation. No evidence of mitral stenosis.  4. The aortic valve is tricuspid. Aortic valve regurgitation is not visualized. No aortic stenosis is present.  5. The inferior vena cava is normal in size with greater than 50% respiratory variability, suggesting right atrial pressure of 3 mmHg. Comparison(s): Function is more dynamic from 2013 report. FINDINGS  Left Ventricle: Left ventricular ejection fraction, by estimation, is 65 to 70%. The left ventricle has normal function. The left ventricle has no regional wall motion abnormalities. The left ventricular internal cavity size was normal in size. There is  no left ventricular hypertrophy. Left ventricular diastolic parameters were normal. Right Ventricle: The right ventricular size is mildly enlarged. No increase in right  ventricular wall thickness. Right ventricular systolic function is normal. Left Atrium: Left atrial size was normal in size. Right Atrium: Right atrial size was normal in size. Pericardium: There is no evidence of pericardial effusion. Mitral Valve: The mitral valve is normal in structure. Mild mitral valve regurgitation. No evidence of mitral valve stenosis. Tricuspid Valve: The tricuspid valve is normal in structure. Tricuspid valve regurgitation is mild . No evidence of tricuspid stenosis. Aortic Valve: The aortic valve is tricuspid. Aortic valve regurgitation is not visualized. No aortic stenosis is present.  Aortic valve mean gradient measures 4.0 mmHg. Aortic valve peak gradient measures 7.5 mmHg. Aortic valve area, by VTI measures 2.80 cm. Pulmonic Valve: The pulmonic valve was not well visualized. Pulmonic valve regurgitation is not visualized. No evidence of pulmonic stenosis. Aorta: The aortic root and ascending aorta are structurally normal, with no evidence of dilitation. Venous: The inferior vena cava is normal in size with greater than 50% respiratory variability, suggesting right atrial pressure of 3 mmHg. IAS/Shunts: No atrial level shunt detected by color flow Doppler.  LEFT VENTRICLE PLAX 2D LVIDd:         5.20 cm     Diastology LVIDs:         3.10 cm     LV e' medial:    8.49 cm/s LV PW:         0.90 cm     LV E/e' medial:  11.8 LV IVS:        0.80 cm     LV e' lateral:   11.30 cm/s LVOT diam:     2.10 cm     LV E/e' lateral: 8.8 LV SV:         86 LV SV Index:   45 LVOT Area:     3.46 cm  LV Volumes (MOD) LV vol d, MOD A4C: 74.5 ml LV vol s, MOD A4C: 19.6 ml LV SV MOD A4C:     74.5 ml RIGHT VENTRICLE RV S prime:     11.00 cm/s TAPSE (M-mode): 3.0 cm LEFT ATRIUM             Index        RIGHT ATRIUM           Index LA Vol (A2C):   49.8 ml 26.28 ml/m  RA Area:     13.30 cm LA Vol (A4C):   61.7 ml 32.57 ml/m  RA Volume:   30.50 ml  16.10 ml/m LA Biplane Vol: 57.8 ml 30.51 ml/m  AORTIC VALVE AV Area (Vmax):    2.63 cm AV Area (Vmean):   2.66 cm AV Area (VTI):     2.80 cm AV Vmax:           137.00 cm/s AV Vmean:          94.900 cm/s AV VTI:            0.307 m AV Peak Grad:      7.5 mmHg AV Mean Grad:      4.0 mmHg LVOT Vmax:         104.00 cm/s LVOT Vmean:        72.900 cm/s LVOT VTI:          0.248 m LVOT/AV VTI ratio: 0.81  AORTA Ao Root diam: 3.20 cm Ao Asc diam:  2.80 cm MITRAL VALVE                TRICUSPID VALVE MV Area (PHT): 5.97 cm  TR Peak grad:   26.4 mmHg MV Decel Time: 127 msec     TR Vmax:        257.00 cm/s MV E velocity: 100.00 cm/s MV Bridgit Eynon velocity: 66.40 cm/s   SHUNTS MV E/Sheldon Amara ratio:   1.51         Systemic VTI:  0.25 m                             Systemic Diam: 2.10 cm Riley Lam MD Electronically signed by Riley Lam MD Signature Date/Time: 02/13/2023/4:40:29 PM    Final    CT Head Wo Contrast  Result Date: 02/12/2023 CLINICAL DATA:  [name] fall. Hit head on the concrete floor with hematoma and abrasion of the back of the head. EXAM: CT HEAD WITHOUT CONTRAST TECHNIQUE: Contiguous axial images were obtained from the base of the skull through the vertex without intravenous contrast. RADIATION DOSE REDUCTION: This exam was performed according to the departmental dose-optimization program which includes automated exposure control, adjustment of the mA and/or kV according to patient size and/or use of iterative reconstruction technique. COMPARISON:  None Available. FINDINGS: Brain: No evidence of acute infarction, hemorrhage, hydrocephalus, extra-axial collection or mass lesion/mass effect. Vascular: No hyperdense vessel or unexpected calcification. Skull: Right parietal scalp hematoma. No calvarial fracture focal bone lesion. Sinuses/Orbits: No acute finding. Other: None. IMPRESSION: 1. No acute intracranial abnormality. 2. Right parietal scalp hematoma. No calvarial fracture. Electronically Signed   By: Larose Hires D.O.   On: 02/12/2023 09:58   CT ABDOMEN PELVIS W CONTRAST  Result Date: 01/28/2023 CLINICAL DATA:  Left lower quadrant abdominal pain EXAM: CT ABDOMEN AND PELVIS WITH CONTRAST TECHNIQUE: Multidetector CT imaging of the abdomen and pelvis was performed using the standard protocol following bolus administration of intravenous contrast. RADIATION DOSE REDUCTION: This exam was performed according to the departmental dose-optimization program which includes automated exposure control, adjustment of the mA and/or kV according to patient size and/or use of iterative reconstruction technique. CONTRAST:  85mL OMNIPAQUE IOHEXOL 300 MG/ML  SOLN COMPARISON:  07/25/2022  FINDINGS: Lower chest:  No contributory findings. Hepatobiliary: Similar large caudate lobe and fissures although no surface lobulation. Cholelithiasis.No evidence of biliary inflammation Pancreas: Unremarkable. Spleen: Unremarkable. Adrenals/Urinary Tract: Negative adrenals. No hydronephrosis or ureteral stone. Numerous left renal calculi with early branching stone at the upper pole measuring up to 1 cm in length. Punctate right lower pole calculus. Unremarkable bladder. Stomach/Bowel:  No obstruction. No appendicitis. Vascular/Lymphatic: No acute vascular abnormality. Atheromatous calcification of the aorta and iliacs. No mass or adenopathy. Reproductive:Enlarged prostate projecting into the lower bladder. Other: No ascites or pneumoperitoneum. Musculoskeletal: No acute abnormalities. IMPRESSION: 1. No acute finding or specific cause for symptoms. 2. Numerous left more than right renal calculi.  Cholelithiasis. Electronically Signed   By: Tiburcio Pea M.D.   On: 01/28/2023 04:25    Microbiology: No results found for this or any previous visit (from the past 240 hour(s)).   Labs: Basic Metabolic Panel: Recent Labs  Lab 02/12/23 0837 02/12/23 1823 02/13/23 0400 02/13/23 1633 02/14/23 0409  NA 122* 123* 123* 124* 125*  K 4.3 4.7 4.2 4.7 3.9  CL 96* 98 99 97* 99  CO2 21* 22 21* 22 20*  GLUCOSE 81 86 86 82 79  BUN 22* 25* 25* 26* 28*  CREATININE 0.80 0.89 0.89 0.96 0.98  CALCIUM 7.9* 8.3* 8.0* 8.4* 8.0*  MG  --   --   --   --  1.7  PHOS  --   --   --   --  5.2*   Liver Function Tests: Recent Labs  Lab 02/08/23 0938 02/12/23 0837 02/13/23 0400 02/14/23 0403  AST  --  340* 307*  --   ALT  --  210* 187*  --   ALKPHOS  --  66 67  --   BILITOT  --  0.8 0.7  --   PROT WILL FOLLOW 6.0* 5.7*  --   ALBUMIN  --  2.0* 1.8* 1.8*   No results for input(s): "LIPASE", "AMYLASE" in the last 168 hours. No results for input(s): "AMMONIA" in the last 168 hours. CBC: Recent Labs  Lab  02/12/23 0837 02/13/23 0400 02/14/23 0409  WBC 3.8* 3.0* 3.4*  NEUTROABS 1.4*  --   --   HGB 10.1* 9.3* 9.5*  HCT 29.7* 27.1* 27.1*  MCV 88.1 89.1 88.6  PLT 180 159 192   Cardiac Enzymes: Recent Labs  Lab 02/12/23 0837  CKTOTAL 1,088*   BNP: BNP (last 3 results) Recent Labs    02/13/23 0400  BNP 77.4    ProBNP (last 3 results) No results for input(s): "PROBNP" in the last 8760 hours.  CBG: No results for input(s): "GLUCAP" in the last 168 hours.     Signed:  [name] MD.  [facility] 02/14/2023, 1:25 PM

## 2023-02-14 NOTE — Progress Notes (Signed)
Spoke with Alvis Lemmings and Toniann Fail @ [facility] to inform of patient current admission status and need to change location of procedure or reschedule.

## 2023-02-14 NOTE — Plan of Care (Signed)

## 2023-02-14 NOTE — Progress Notes (Signed)
Chart reviewed w/ anesthesia, Dr Krista Blue MDA,  due to pt being admitted on 02-12-2023 for a fall , hyponatremia , inpatient at [facility]. Pt needs review ,since he is on surgery schedule for [facility] 02-15-2023 by Dr Freida Busman for muscle biosy, for change of status.  Dr Krista Blue MDA stated if gets discharged today pt would be ok to proceed at [facility].

## 2023-02-14 NOTE — TOC Initial Note (Signed)
Transition of Care [facility]) - Initial/Assessment Note    Patient Details  Name: Stuart Collins MRN: [id number] Date of Birth: May 26, 1966  Transition of Care [facility]) CM/SW Contact:    Adrian Prows, RN Phone Number: 02/14/2023, 1:52 PM  Clinical Narrative:                 SDOH risk; spoke w/ pt in room; pt says he is from home and plans to return at d/c' he identified POC Jean-Paul Kosh (mother) [phone]; pt says he has transportation; he also has glasses and a cane; pt denies IPV, food and housing insecurity, and difficulty paying utilities; he says he does not have HH services or home oxygen; no TOC needs.  Expected Discharge Plan: Home/Self Care Barriers to Discharge: No Barriers Identified   Patient Goals and CMS Choice Patient states their goals for this hospitalization and ongoing recovery are:: home          Expected Discharge Plan and Services   Discharge Planning Services: CM Consult   Living arrangements for the past 2 months: Single Family Home Expected Discharge Date: 02/14/23                                    Prior Living Arrangements/Services Living arrangements for the past 2 months: Single Family Home Lives with:: Self Patient language and need for interpreter reviewed:: Yes Do you feel safe going back to the place where you live?: Yes      Need for Family Participation in Patient Care: Yes (Comment) Care giver support system in place?: Yes (comment) Current home services: DME (cane) Criminal Activity/Legal Involvement Pertinent to Current Situation/Hospitalization: No - Comment as needed  Activities of Daily Living Home Assistive Devices/Equipment: Cane (specify quad or straight), Eyeglasses ADL Screening (condition at time of admission) Patient's cognitive ability adequate to safely complete daily activities?: Yes Is the patient deaf or have difficulty hearing?: Yes Does the patient have difficulty seeing, even when wearing glasses/contacts?:  No Does the patient have difficulty concentrating, remembering, or making decisions?: No Patient able to express need for assistance with ADLs?: Yes Does the patient have difficulty dressing or bathing?: Yes Independently performs ADLs?: Yes (appropriate for developmental age) Does the patient have difficulty walking or climbing stairs?: Yes Weakness of Legs: Both Weakness of Arms/Hands: Both  Permission Sought/Granted Permission sought to share information with : Case Manager Permission granted to share information with : Yes, Verbal Permission Granted  Share Information with NAME: Burnard Bunting, RN, CM     Permission granted to share info w Relationship: Suvan Eisenberger (mother) [phone]     Emotional Assessment Appearance:: Appears stated age Attitude/Demeanor/Rapport: Gracious Affect (typically observed): Accepting Orientation: : Oriented to Self, Oriented to Place, Oriented to  Time, Oriented to Situation Alcohol / Substance Use: Not Applicable Psych Involvement: No (comment)  Admission diagnosis:  Hyponatremia [E87.1] Patient Active Problem List   Diagnosis Date Noted   Hyponatremia 02/12/2023   Allergic rhinitis 11/02/2021   Allergic rhinitis due to animal (cat) (dog) hair and dander 11/02/2021   Allergic rhinitis due to pollen 11/02/2021   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 AT [address] OF [facility] [address] [place] [address] Phone: [phone] Fax: [phone]  [facility] - [place], [place] - [address] [place] [address] Phone: [phone] Fax: [phone]  [facility] - [place], [place] - [address] [place] [address] Phone: [phone] Fax: [phone]     Social Determinants of Health (SDOH) Social History: SDOH Screenings   Food Insecurity: No Food Insecurity (02/14/2023)  Housing: Low Risk  (02/14/2023)  Transportation Needs: No Transportation Needs (02/14/2023)  Recent Concern: Transportation Needs - Unmet Transportation Needs (02/12/2023)  Utilities: Not At Risk (02/14/2023)  Tobacco Use: Medium Risk (02/12/2023)   SDOH Interventions: Food Insecurity Interventions: Inpatient TOC Housing Interventions: Inpatient TOC Transportation Interventions: Inpatient TOC Utilities Interventions: Inpatient TOC   Readmission Risk Interventions     No data to display

## 2023-02-15 ENCOUNTER — Ambulatory Visit (HOSPITAL_BASED_OUTPATIENT_CLINIC_OR_DEPARTMENT_OTHER)
Admission: RE | Admit: 2023-02-15 | Discharge: 2023-02-15 | Disposition: A | Discharge status: Home / Self Care | Payer: Managed Care, Other (non HMO) | Attending: Surgery | Admitting: Surgery

## 2023-02-15 ENCOUNTER — Other Ambulatory Visit: Payer: Self-pay

## 2023-02-15 ENCOUNTER — Encounter (HOSPITAL_BASED_OUTPATIENT_CLINIC_OR_DEPARTMENT_OTHER): Payer: Self-pay | Admitting: Surgery

## 2023-02-15 ENCOUNTER — Ambulatory Visit (HOSPITAL_BASED_OUTPATIENT_CLINIC_OR_DEPARTMENT_OTHER): Payer: Managed Care, Other (non HMO) | Admitting: Anesthesiology

## 2023-02-15 ENCOUNTER — Encounter (HOSPITAL_BASED_OUTPATIENT_CLINIC_OR_DEPARTMENT_OTHER)
Admission: RE | Disposition: A | Discharge status: Home / Self Care | Payer: Self-pay | Source: Home / Self Care | Attending: Surgery

## 2023-02-15 DIAGNOSIS — J45909 Unspecified asthma, uncomplicated: Secondary | ICD-10-CM | POA: Insufficient documentation

## 2023-02-15 DIAGNOSIS — M6281 Muscle weakness (generalized): Secondary | ICD-10-CM | POA: Insufficient documentation

## 2023-02-15 DIAGNOSIS — Z87891 Personal history of nicotine dependence: Secondary | ICD-10-CM | POA: Insufficient documentation

## 2023-02-15 DIAGNOSIS — D649 Anemia, unspecified: Secondary | ICD-10-CM | POA: Diagnosis not present

## 2023-02-15 DIAGNOSIS — Z79899 Other long term (current) drug therapy: Secondary | ICD-10-CM | POA: Diagnosis not present

## 2023-02-15 DIAGNOSIS — Z01818 Encounter for other preprocedural examination: Secondary | ICD-10-CM

## 2023-02-15 DIAGNOSIS — M069 Rheumatoid arthritis, unspecified: Secondary | ICD-10-CM | POA: Diagnosis not present

## 2023-02-15 DIAGNOSIS — M609 Myositis, unspecified: Secondary | ICD-10-CM | POA: Diagnosis not present

## 2023-02-15 DIAGNOSIS — G7249 Other inflammatory and immune myopathies, not elsewhere classified: Secondary | ICD-10-CM

## 2023-02-15 DIAGNOSIS — K219 Gastro-esophageal reflux disease without esophagitis: Secondary | ICD-10-CM | POA: Diagnosis not present

## 2023-02-15 HISTORY — DX: Presence of spectacles and contact lenses: Z97.3

## 2023-02-15 HISTORY — DX: Abnormal levels of other serum enzymes: R74.8

## 2023-02-15 HISTORY — DX: Dyspnea, unspecified: R06.00

## 2023-02-15 HISTORY — DX: Personal history of urinary calculi: Z87.442

## 2023-02-15 HISTORY — DX: Muscle weakness (generalized): M62.81

## 2023-02-15 HISTORY — DX: Nonalcoholic steatohepatitis (NASH): K75.81

## 2023-02-15 HISTORY — DX: Family history of other specified conditions: Z84.89

## 2023-02-15 HISTORY — DX: Sciatica, right side: M54.31

## 2023-02-15 HISTORY — DX: Mild intermittent asthma, uncomplicated: J45.20

## 2023-02-15 HISTORY — DX: Raynaud's syndrome without gangrene: I73.00

## 2023-02-15 HISTORY — DX: Mixed hyperlipidemia: E78.2

## 2023-02-15 HISTORY — DX: Rheumatoid arthritis, unspecified: M06.9

## 2023-02-15 HISTORY — DX: Nocturia: R35.1

## 2023-02-15 HISTORY — DX: Gastro-esophageal reflux disease without esophagitis: K21.9

## 2023-02-15 HISTORY — PX: MUSCLE BIOPSY: SHX716

## 2023-02-15 LAB — POCT I-STAT, CHEM 8
BUN: 23 mg/dL — ABNORMAL HIGH (ref 6–20)
Calcium, Ion: 1.23 mmol/L (ref 1.15–1.40)
Chloride: 99 mmol/L (ref 98–111)
Creatinine, Ser: 0.9 mg/dL (ref 0.61–1.24)
Glucose, Bld: 77 mg/dL (ref 70–99)
HCT: 27 % — ABNORMAL LOW (ref 39.0–52.0)
Hemoglobin: 9.2 g/dL — ABNORMAL LOW (ref 13.0–17.0)
Potassium: 4.1 mmol/L (ref 3.5–5.1)
Sodium: 131 mmol/L — ABNORMAL LOW (ref 135–145)
TCO2: 24 mmol/L (ref 22–32)

## 2023-02-15 LAB — HIV-1/2 AB - DIFFERENTIATION
HIV 1 Ab: NONREACTIVE
HIV 2 Ab: NONREACTIVE
Note: NEGATIVE

## 2023-02-15 LAB — MISC LABCORP TEST (SEND OUT): Labcorp test code: 144000

## 2023-02-15 LAB — HIV-1/HIV-2 QUALITATIVE RNA
Final Interpretation: NEGATIVE
HIV-1 RNA, Qualitative: NONREACTIVE
HIV-2 RNA, Qualitative: NONREACTIVE

## 2023-02-15 SURGERY — MUSCLE BIOPSY
Anesthesia: Monitor Anesthesia Care | Site: Thigh | Laterality: Left

## 2023-02-15 MED ORDER — CEFAZOLIN SODIUM-DEXTROSE 2-4 GM/100ML-% IV SOLN
2.0000 g | INTRAVENOUS | Status: AC
  Administered 2023-02-15: 2 g via INTRAVENOUS

## 2023-02-15 MED ORDER — MIDAZOLAM HCL 2 MG/2ML IJ SOLN
INTRAMUSCULAR | Status: AC
  Filled 2023-02-15: qty 2

## 2023-02-15 MED ORDER — PROPOFOL 10 MG/ML IV BOLUS
INTRAVENOUS | Status: DC | PRN
  Administered 2023-02-15: 20 mg via INTRAVENOUS

## 2023-02-15 MED ORDER — LACTATED RINGERS IV SOLN: INTRAVENOUS | Status: DC

## 2023-02-15 MED ORDER — BUPIVACAINE HCL 0.25 % IJ SOLN
INTRAMUSCULAR | Status: DC | PRN
  Administered 2023-02-15: 10 mL

## 2023-02-15 MED ORDER — MIDAZOLAM HCL 5 MG/5ML IJ SOLN
INTRAMUSCULAR | Status: DC | PRN
  Administered 2023-02-15: 2 mg via INTRAVENOUS

## 2023-02-15 MED ORDER — FENTANYL CITRATE (PF) 250 MCG/5ML IJ SOLN
INTRAMUSCULAR | Status: DC | PRN
  Administered 2023-02-15 (×2): 25 ug via INTRAVENOUS

## 2023-02-15 MED ORDER — ONDANSETRON HCL 4 MG/2ML IJ SOLN
INTRAMUSCULAR | Status: DC | PRN
  Administered 2023-02-15: 4 mg via INTRAVENOUS

## 2023-02-15 MED ORDER — PROPOFOL 500 MG/50ML IV EMUL
INTRAVENOUS | Status: DC | PRN
  Administered 2023-02-15: 200 ug/kg/min via INTRAVENOUS

## 2023-02-15 MED ORDER — ACETAMINOPHEN 500 MG PO TABS
ORAL_TABLET | ORAL | Status: AC
  Filled 2023-02-15: qty 2

## 2023-02-15 MED ORDER — FENTANYL CITRATE (PF) 100 MCG/2ML IJ SOLN
INTRAMUSCULAR | Status: AC
  Filled 2023-02-15: qty 2

## 2023-02-15 MED ORDER — 0.9 % SODIUM CHLORIDE (POUR BTL) OPTIME
TOPICAL | Status: DC | PRN
  Administered 2023-02-15: 500 mL

## 2023-02-15 MED ORDER — CEFAZOLIN SODIUM-DEXTROSE 2-4 GM/100ML-% IV SOLN
INTRAVENOUS | Status: AC
  Filled 2023-02-15: qty 100

## 2023-02-15 MED ORDER — ACETAMINOPHEN 500 MG PO TABS: 1000.0000 mg | ORAL_TABLET | ORAL | Status: DC

## 2023-02-15 MED ORDER — TRAMADOL HCL 50 MG PO TABS: 50.0000 mg | ORAL_TABLET | Freq: Three times a day (TID) | ORAL | 0 refills | Status: AC | PRN

## 2023-02-15 SURGICAL SUPPLY — 42 items
ADH SKN CLS APL DERMABOND .7 (GAUZE/BANDAGES/DRESSINGS) ×1
APL PRP STRL LF DISP 70% ISPRP (MISCELLANEOUS) ×1
BLADE SURG 15 STRL LF DISP TIS (BLADE) ×1 IMPLANT
BLADE SURG 15 STRL SS (BLADE) ×1
CHLORAPREP W/TINT 26 (MISCELLANEOUS) IMPLANT
CNTNR URN SCR LID CUP LEK RST (MISCELLANEOUS) ×1 IMPLANT
CONT SPEC 4OZ STRL OR WHT (MISCELLANEOUS) ×1
COVER BACK TABLE 60X90IN (DRAPES) ×1 IMPLANT
COVER MAYO STAND STRL (DRAPES) ×1 IMPLANT
DERMABOND ADVANCED .7 DNX12 (GAUZE/BANDAGES/DRESSINGS) ×1 IMPLANT
DRAPE LAPAROTOMY 100X72 PEDS (DRAPES) IMPLANT
DRAPE UTILITY XL STRL (DRAPES) ×1 IMPLANT
DRSG TELFA 3X8 NADH STRL (GAUZE/BANDAGES/DRESSINGS) ×1 IMPLANT
ELECT REM PT RETURN 9FT ADLT (ELECTROSURGICAL) ×1
ELECTRODE REM PT RTRN 9FT ADLT (ELECTROSURGICAL) ×1 IMPLANT
GAUZE 4X4 16PLY ~~LOC~~+RFID DBL (SPONGE) IMPLANT
GAUZE SPONGE 4X4 12PLY STRL (GAUZE/BANDAGES/DRESSINGS) ×1 IMPLANT
GLOVE BIO SURGEON STRL SZ 6 (GLOVE) ×1 IMPLANT
GLOVE BIOGEL PI IND STRL 6 (GLOVE) ×1 IMPLANT
GLOVE BIOGEL PI IND STRL 7.0 (GLOVE) IMPLANT
GLOVE SS PI  5.5 STRL (GLOVE) ×1
GLOVE SS PI 5.5 STRL (GLOVE) ×1 IMPLANT
GOWN STRL REUS W/TWL LRG LVL3 (GOWN DISPOSABLE) ×1 IMPLANT
KIT TURNOVER CYSTO (KITS) ×1 IMPLANT
NDL HYPO 25X1 1.5 SAFETY (NEEDLE) IMPLANT
NEEDLE HYPO 25X1 1.5 SAFETY (NEEDLE) ×1 IMPLANT
NS IRRIG 500ML POUR BTL (IV SOLUTION) ×1 IMPLANT
PACK BASIN DAY SURGERY FS (CUSTOM PROCEDURE TRAY) ×1 IMPLANT
PENCIL SMOKE EVACUATOR (MISCELLANEOUS) ×1 IMPLANT
SLEEVE SCD COMPRESS KNEE MED (STOCKING) ×1 IMPLANT
SPONGE T-LAP 4X18 ~~LOC~~+RFID (SPONGE) IMPLANT
SUT MNCRL AB 4-0 PS2 18 (SUTURE) ×1 IMPLANT
SUT VIC AB 2-0 SH 27 (SUTURE)
SUT VIC AB 2-0 SH 27XBRD (SUTURE) IMPLANT
SUT VIC AB 3-0 SH 27 (SUTURE) ×1
SUT VIC AB 3-0 SH 27X BRD (SUTURE) IMPLANT
SYR BULB IRRIG 60ML STRL (SYRINGE) IMPLANT
SYR CONTROL 10ML LL (SYRINGE) ×1 IMPLANT
TOWEL OR 17X24 6PK STRL BLUE (TOWEL DISPOSABLE) ×1 IMPLANT
TRAY DSU PREP LF (CUSTOM PROCEDURE TRAY) IMPLANT
TUBE CONNECTING 12X1/4 (SUCTIONS) IMPLANT
YANKAUER SUCT BULB TIP NO VENT (SUCTIONS) IMPLANT

## 2023-02-15 NOTE — Anesthesia Preprocedure Evaluation (Addendum)
Anesthesia Evaluation  Patient identified by MRN, date of birth, ID band Patient awake    Reviewed: Allergy & Precautions, NPO status , Patient's Chart, lab work & pertinent test results  Airway Mallampati: III  TM Distance: >3 FB Neck ROM: Full    Dental no notable dental hx.    Pulmonary asthma , former smoker   Pulmonary exam normal breath sounds clear to auscultation       Cardiovascular Normal cardiovascular exam Rhythm:Regular Rate:Normal  ECHO: 1. Left ventricular ejection fraction, by estimation, is 65 to 70% . The left ventricle has normal function. The left ventricle has no regional wall motion abnormalities. Left ventricular diastolic parameters were normal. 2. Right ventricular systolic function is normal. The right ventricular size is mildly enlarged. 3. The mitral valve is normal in structure. Mild mitral valve regurgitation. No evidence of mitral stenosis. 4. The aortic valve is tricuspid. Aortic valve regurgitation is not visualized. No aortic stenosis is present. 5. The inferior vena cava is normal in size with greater than 50% respiratory variability, suggesting right atrial pressure of 3 mmHg.   Neuro/Psych  Headaches Muscle weakness in bilateral lower extremeties [place]   Neuromuscular disease  negative psych ROS   GI/Hepatic Neg liver ROS,GERD  Medicated and Controlled,,  Endo/Other  Hyponatremia   Renal/GU negative Renal ROS     Musculoskeletal  (+) Arthritis ,    Abdominal   Peds  Hematology  (+) Blood dyscrasia, anemia   Anesthesia Other Findings MYOSITIS  Reproductive/Obstetrics                             Anesthesia Physical Anesthesia Plan  ASA: 4  Anesthesia Plan: MAC   Post-op Pain Management:    Induction: Intravenous  PONV Risk Score and Plan: 1 and Ondansetron, Dexamethasone, Midazolam, Propofol infusion and Treatment may vary due to age or medical  condition  Airway Management Planned: Simple Face Mask  Additional Equipment:   Intra-op Plan:   Post-operative Plan:   Informed Consent: I have reviewed the patients History and Physical, chart, labs and discussed the procedure including the risks, benefits and alternatives for the proposed anesthesia with the patient or authorized representative who has indicated his/her understanding and acceptance.     Dental advisory given  Plan Discussed with: CRNA  Anesthesia Plan Comments:        Anesthesia Quick Evaluation

## 2023-02-15 NOTE — Transfer of Care (Signed)
Immediate Anesthesia Transfer of Care Note  Patient: Stuart Collins  Procedure(s) Performed: LEFT THIGH MUSCLE BIOPSY (Left: Thigh)  Patient Location: PACU  Anesthesia Type:MAC  Level of Consciousness: drowsy and patient cooperative  Airway & Oxygen Therapy: Patient Spontanous Breathing  Post-op Assessment: Report given to RN and Post -op Vital signs reviewed and stable  Post vital signs: Reviewed and stable  Last Vitals:  Vitals Value Taken Time  BP 111/65 02/15/23 1335  Temp 36.5 C 02/15/23 1335  Pulse 81 02/15/23 1340  Resp 12 02/15/23 1340  SpO2 95 % 02/15/23 1340  Vitals shown include unvalidated device data.  Last Pain:  Vitals:   02/15/23 1335  TempSrc:   PainSc: Asleep      Patients Stated Pain Goal: 7 (02/15/23 0856)  Complications: No notable events documented.

## 2023-02-15 NOTE — Op Note (Signed)
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 is a 57 yo male with rheumatoid arthritis, who has had progressive proximal muscle weakness with elevated CK. He was referred for a muscle biopsy for workup of suspected myositis. After a discussion of the risks and benefits of surgery, he agreed to proceed.  Findings: Incisional biopsy performed of the left vastus lateralis muscle.  Procedure details: Informed consent was obtained in the preoperative area prior to the procedure. The patient was brought to the operating room and placed on the table in the supine position. Sedation was administered and perioperative antibiotics were administered per SCIP guidelines. The left thigh was prepped and draped in the usual sterile fashion. A pre-procedure timeout was taken verifying patient identity, surgical site and procedure to be performed.  The skin on the left lateral thigh was infiltrated with local anesthetic and a longitudinal skin incision was made. The subcutaneous tissue was divided with cautery, and the muscle fascia was open with cautery. A segment of underlying muscle was excised with cautery and sent fresh to pathology. The muscle fascia was closed with running 3-0 Vicryl suture. The deep dermis was closed with interrupted 3-0 Vicryl sutures, and the skin was closed with a running subcuticular 4-0 monocryl suture. Dermabond was applied.  The patient tolerated the procedure well with no apparent complications. All counts were correct x2 at the end of the procedure. The patient was taken to PACU in stable condition.  Sophronia Simas, MD 02/15/23 1:29 PM

## 2023-02-15 NOTE — Interval H&P Note (Signed)
History and Physical Interval Note:  02/15/2023 10:34 AM  Stuart Collins  has presented today for surgery, with the diagnosis of MYOSITIS.  The various methods of treatment have been discussed with the patient and family. After consideration of risks, benefits and other options for treatment, the patient has consented to  Procedure(s): LEFT THIGH MUSCLE BIOPSY (Left) as a surgical intervention.  The patient's history has been reviewed. He was admitted to the hospital this week after a fall, with volume overload and hyponatremia. He was discharged yesterday, on oral lasix. Hyponatremia is asymptomatic. Case discussed with Dr. Bradley Ferris, will proceed with biopsy today as planned. I have reviewed the patient's chart and labs.  Questions were answered to the patient's satisfaction. Plan to biopsy the left vastus lateralis muscle. Surgical site confirmed and marked.   Fritzi Mandes

## 2023-02-15 NOTE — Discharge Instructions (Addendum)
[facility] DISCHARGE INSTRUCTIONS  Activity Ok to shower in 24 hours, but do not bathe or submerge incision underwater for 2 weeks. Do not drive while taking narcotic pain medication.  Wound Care Your incision is covered with skin glue called Dermabond. This will peel off on its own over time. You may shower and allow warm soapy water to run over your incision. Gently pat dry. Do not submerge your incision underwater for 2 weeks. Monitor your incision for any new redness, tenderness, or drainage.  When to Call us: Fever greater than 100.5 New redness, drainage, or swelling at incision site Severe pain, nausea, or vomiting  Follow-up You will have an virtual phone follow up with Dr. Freida Busman on June 21 at 1:50pm. If you need to change your appointment time, or if you would like to have in-person visit, please call the [facility] Surgery office at [phone].  For questions or concerns, please call the office at [phone].          Post Anesthesia Home Care Instructions  Activity: Get plenty of rest for the remainder of the day. A responsible individual must stay with you for 24 hours following the procedure.  For the next 24 hours, DO NOT: -Drive a car -Advertising copywriter -Drink alcoholic beverages -Take any medication unless instructed by your physician -Make any legal decisions or sign important papers.  Meals: Start with liquid foods such as gelatin or soup. Progress to regular foods as tolerated. Avoid greasy, spicy, heavy foods. If nausea and/or vomiting occur, drink only clear liquids until the nausea and/or vomiting subsides. Call your physician if vomiting continues.  Special Instructions/Symptoms: Your throat may feel dry or sore from the anesthesia or the breathing tube placed in your throat during surgery. If this causes discomfort, gargle with warm salt water. The discomfort should disappear within 24 hours.

## 2023-02-18 ENCOUNTER — Encounter (HOSPITAL_BASED_OUTPATIENT_CLINIC_OR_DEPARTMENT_OTHER): Payer: Self-pay | Admitting: Surgery

## 2023-02-18 NOTE — Anesthesia Postprocedure Evaluation (Signed)
Anesthesia Post Note  Patient: Stuart Collins  Procedure(s) Performed: LEFT THIGH MUSCLE BIOPSY (Left: Thigh)     Patient location during evaluation: PACU Anesthesia Type: MAC Level of consciousness: awake Pain management: pain level controlled Vital Signs Assessment: post-procedure vital signs reviewed and stable Respiratory status: spontaneous breathing, nonlabored ventilation and respiratory function stable Cardiovascular status: blood pressure returned to baseline and stable Postop Assessment: no apparent nausea or vomiting Anesthetic complications: no   No notable events documented.  Last Vitals:  Vitals:   02/15/23 1415 02/15/23 1431  BP: 121/75 129/85  Pulse: 72 77  Resp: 12 16  Temp:  (!) 36.2 C  SpO2: 98% 100%    Last Pain:  Vitals:   02/15/23 1431  TempSrc:   PainSc: 0-No pain   Pain Goal: Patients Stated Pain Goal: 7 (02/15/23 0856)                 Catheryn Bacon Kyrstyn Greear

## 2023-02-19 ENCOUNTER — Encounter (HOSPITAL_COMMUNITY): Payer: Self-pay | Admitting: Radiology

## 2023-02-19 ENCOUNTER — Emergency Department (HOSPITAL_COMMUNITY): Payer: Managed Care, Other (non HMO)

## 2023-02-19 ENCOUNTER — Other Ambulatory Visit: Payer: Self-pay

## 2023-02-19 ENCOUNTER — Inpatient Hospital Stay (HOSPITAL_COMMUNITY)
Admission: EM | Admit: 2023-02-19 | Discharge: 2023-02-21 | DRG: 291 | Disposition: A | Discharge status: Home / Self Care | Payer: Managed Care, Other (non HMO) | Attending: Family Medicine | Admitting: Family Medicine

## 2023-02-19 DIAGNOSIS — J4520 Mild intermittent asthma, uncomplicated: Secondary | ICD-10-CM | POA: Diagnosis present

## 2023-02-19 DIAGNOSIS — E440 Moderate protein-calorie malnutrition: Secondary | ICD-10-CM | POA: Diagnosis present

## 2023-02-19 DIAGNOSIS — I5033 Acute on chronic diastolic (congestive) heart failure: Secondary | ICD-10-CM | POA: Diagnosis present

## 2023-02-19 DIAGNOSIS — I272 Pulmonary hypertension, unspecified: Secondary | ICD-10-CM | POA: Diagnosis present

## 2023-02-19 DIAGNOSIS — Z5982 Transportation insecurity: Secondary | ICD-10-CM

## 2023-02-19 DIAGNOSIS — R7989 Other specified abnormal findings of blood chemistry: Secondary | ICD-10-CM | POA: Diagnosis present

## 2023-02-19 DIAGNOSIS — M5431 Sciatica, right side: Secondary | ICD-10-CM | POA: Insufficient documentation

## 2023-02-19 DIAGNOSIS — M609 Myositis, unspecified: Secondary | ICD-10-CM | POA: Diagnosis present

## 2023-02-19 DIAGNOSIS — Z83438 Family history of other disorder of lipoprotein metabolism and other lipidemia: Secondary | ICD-10-CM

## 2023-02-19 DIAGNOSIS — E785 Hyperlipidemia, unspecified: Secondary | ICD-10-CM | POA: Diagnosis present

## 2023-02-19 DIAGNOSIS — M069 Rheumatoid arthritis, unspecified: Secondary | ICD-10-CM | POA: Diagnosis present

## 2023-02-19 DIAGNOSIS — K7581 Nonalcoholic steatohepatitis (NASH): Secondary | ICD-10-CM | POA: Diagnosis present

## 2023-02-19 DIAGNOSIS — E8770 Fluid overload, unspecified: Secondary | ICD-10-CM | POA: Diagnosis not present

## 2023-02-19 DIAGNOSIS — Z79899 Other long term (current) drug therapy: Secondary | ICD-10-CM

## 2023-02-19 DIAGNOSIS — J309 Allergic rhinitis, unspecified: Secondary | ICD-10-CM | POA: Diagnosis present

## 2023-02-19 DIAGNOSIS — D72829 Elevated white blood cell count, unspecified: Secondary | ICD-10-CM | POA: Diagnosis present

## 2023-02-19 DIAGNOSIS — Z85828 Personal history of other malignant neoplasm of skin: Secondary | ICD-10-CM

## 2023-02-19 DIAGNOSIS — Z91010 Allergy to peanuts: Secondary | ICD-10-CM

## 2023-02-19 DIAGNOSIS — E871 Hypo-osmolality and hyponatremia: Secondary | ICD-10-CM | POA: Diagnosis present

## 2023-02-19 DIAGNOSIS — Z8249 Family history of ischemic heart disease and other diseases of the circulatory system: Secondary | ICD-10-CM

## 2023-02-19 DIAGNOSIS — D649 Anemia, unspecified: Secondary | ICD-10-CM | POA: Diagnosis present

## 2023-02-19 DIAGNOSIS — J45909 Unspecified asthma, uncomplicated: Secondary | ICD-10-CM | POA: Diagnosis present

## 2023-02-19 DIAGNOSIS — R7401 Elevation of levels of liver transaminase levels: Secondary | ICD-10-CM | POA: Diagnosis present

## 2023-02-19 DIAGNOSIS — I110 Hypertensive heart disease with heart failure: Principal | ICD-10-CM | POA: Diagnosis present

## 2023-02-19 DIAGNOSIS — Z6821 Body mass index (BMI) 21.0-21.9, adult: Secondary | ICD-10-CM

## 2023-02-19 DIAGNOSIS — I7300 Raynaud's syndrome without gangrene: Secondary | ICD-10-CM | POA: Diagnosis present

## 2023-02-19 DIAGNOSIS — E8809 Other disorders of plasma-protein metabolism, not elsewhere classified: Secondary | ICD-10-CM | POA: Diagnosis present

## 2023-02-19 DIAGNOSIS — Z91013 Allergy to seafood: Secondary | ICD-10-CM

## 2023-02-19 DIAGNOSIS — E782 Mixed hyperlipidemia: Secondary | ICD-10-CM | POA: Diagnosis present

## 2023-02-19 DIAGNOSIS — R001 Bradycardia, unspecified: Secondary | ICD-10-CM | POA: Diagnosis present

## 2023-02-19 DIAGNOSIS — I2510 Atherosclerotic heart disease of native coronary artery without angina pectoris: Secondary | ICD-10-CM | POA: Diagnosis present

## 2023-02-19 DIAGNOSIS — Z91018 Allergy to other foods: Secondary | ICD-10-CM

## 2023-02-19 DIAGNOSIS — R601 Generalized edema: Secondary | ICD-10-CM | POA: Diagnosis present

## 2023-02-19 DIAGNOSIS — Z91048 Other nonmedicinal substance allergy status: Secondary | ICD-10-CM

## 2023-02-19 DIAGNOSIS — K219 Gastro-esophageal reflux disease without esophagitis: Secondary | ICD-10-CM | POA: Diagnosis present

## 2023-02-19 DIAGNOSIS — M3320 Polymyositis, organ involvement unspecified: Secondary | ICD-10-CM | POA: Diagnosis present

## 2023-02-19 DIAGNOSIS — Z87891 Personal history of nicotine dependence: Secondary | ICD-10-CM

## 2023-02-19 DIAGNOSIS — Z885 Allergy status to narcotic agent status: Secondary | ICD-10-CM

## 2023-02-19 DIAGNOSIS — Z8261 Family history of arthritis: Secondary | ICD-10-CM

## 2023-02-19 DIAGNOSIS — I2489 Other forms of acute ischemic heart disease: Secondary | ICD-10-CM | POA: Diagnosis present

## 2023-02-19 DIAGNOSIS — Z87442 Personal history of urinary calculi: Secondary | ICD-10-CM

## 2023-02-19 LAB — CBC WITH DIFFERENTIAL/PLATELET
Abs Immature Granulocytes: 0.17 10*3/uL — ABNORMAL HIGH (ref 0.00–0.07)
Basophils Absolute: 0 10*3/uL (ref 0.0–0.1)
Basophils Relative: 0 %
Eosinophils Absolute: 0 10*3/uL (ref 0.0–0.5)
Eosinophils Relative: 0 %
HCT: 27.9 % — ABNORMAL LOW (ref 39.0–52.0)
Hemoglobin: 8.9 g/dL — ABNORMAL LOW (ref 13.0–17.0)
Immature Granulocytes: 2 %
Lymphocytes Relative: 29 %
Lymphs Abs: 3.1 10*3/uL (ref 0.7–4.0)
MCH: 29.9 pg (ref 26.0–34.0)
MCHC: 31.9 g/dL (ref 30.0–36.0)
MCV: 93.6 fL (ref 80.0–100.0)
Monocytes Absolute: 1 10*3/uL (ref 0.1–1.0)
Monocytes Relative: 9 %
Neutro Abs: 6.3 10*3/uL (ref 1.7–7.7)
Neutrophils Relative %: 60 %
Platelets: 253 10*3/uL (ref 150–400)
RBC: 2.98 MIL/uL — ABNORMAL LOW (ref 4.22–5.81)
RDW: 16.1 % — ABNORMAL HIGH (ref 11.5–15.5)
WBC: 10.6 10*3/uL — ABNORMAL HIGH (ref 4.0–10.5)
nRBC: 0.7 % — ABNORMAL HIGH (ref 0.0–0.2)

## 2023-02-19 LAB — COMPREHENSIVE METABOLIC PANEL
ALT: 158 U/L — ABNORMAL HIGH (ref 0–44)
AST: 151 U/L — ABNORMAL HIGH (ref 15–41)
Albumin: 2.7 g/dL — ABNORMAL LOW (ref 3.5–5.0)
Alkaline Phosphatase: 80 U/L (ref 38–126)
Anion gap: 7 (ref 5–15)
BUN: 43 mg/dL — ABNORMAL HIGH (ref 6–20)
CO2: 20 mmol/L — ABNORMAL LOW (ref 22–32)
Calcium: 8.8 mg/dL — ABNORMAL LOW (ref 8.9–10.3)
Chloride: 106 mmol/L (ref 98–111)
Creatinine, Ser: 0.97 mg/dL (ref 0.61–1.24)
GFR, Estimated: >60 mL/min (ref >60)
Glucose, Bld: 110 mg/dL — ABNORMAL HIGH (ref 70–99)
Potassium: 3.9 mmol/L (ref 3.5–5.1)
Sodium: 133 mmol/L — ABNORMAL LOW (ref 135–145)
Total Bilirubin: 0.4 mg/dL (ref 0.3–1.2)
Total Protein: 8.1 g/dL (ref 6.5–8.1)

## 2023-02-19 LAB — MULTIPLE MYELOMA PANEL, SERUM
Albumin/Glob SerPl: 0.7 (ref 0.7–1.7)
Alpha2 Glob SerPl Elph-Mcnc: 0.5 g/dL (ref 0.4–1.0)
B-Globulin SerPl Elph-Mcnc: 0.8 g/dL (ref 0.7–1.3)
Globulin, Total: 3.8 g/dL (ref 2.2–3.9)
IgA/Immunoglobulin A, Serum: 151 mg/dL (ref 90–386)
IgG (Immunoglobin G), Serum: 2530 mg/dL — ABNORMAL HIGH (ref 603–1613)
IgM (Immunoglobulin M), Srm: 308 mg/dL — ABNORMAL HIGH (ref 20–172)
Total Protein: 6.3 g/dL (ref 6.0–8.5)

## 2023-02-19 LAB — CK: Total CK: 209 U/L (ref 49–397)

## 2023-02-19 LAB — TROPONIN I (HIGH SENSITIVITY)
Troponin I (High Sensitivity): 67 ng/L — ABNORMAL HIGH (ref <18)
Troponin I (High Sensitivity): 48 ng/L — ABNORMAL HIGH (ref <18)

## 2023-02-19 LAB — TSH: TSH: 5.595 u[IU]/mL — ABNORMAL HIGH (ref 0.350–4.500)

## 2023-02-19 LAB — BRAIN NATRIURETIC PEPTIDE: B Natriuretic Peptide: 700.8 pg/mL — ABNORMAL HIGH (ref 0.0–100.0)

## 2023-02-19 MED ORDER — ONDANSETRON HCL 4 MG/2ML IJ SOLN: 4.0000 mg | Freq: Four times a day (QID) | INTRAMUSCULAR | Status: DC | PRN

## 2023-02-19 MED ORDER — LORATADINE 10 MG PO TABS
10.0000 mg | ORAL_TABLET | Freq: Every day | ORAL | Status: DC
  Administered 2023-02-19 – 2023-02-20 (×2): 10 mg via ORAL
  Filled 2023-02-19 (×2): qty 1

## 2023-02-19 MED ORDER — ACETAMINOPHEN 325 MG PO TABS: 650.0000 mg | ORAL_TABLET | Freq: Four times a day (QID) | ORAL | Status: DC | PRN

## 2023-02-19 MED ORDER — ACETAMINOPHEN 650 MG RE SUPP: 650.0000 mg | Freq: Four times a day (QID) | RECTAL | Status: DC | PRN

## 2023-02-19 MED ORDER — POTASSIUM CHLORIDE CRYS ER 20 MEQ PO TBCR
20.0000 meq | EXTENDED_RELEASE_TABLET | Freq: Every day | ORAL | Status: DC
  Administered 2023-02-19 – 2023-02-21 (×3): 20 meq via ORAL
  Filled 2023-02-19 (×3): qty 1

## 2023-02-19 MED ORDER — PANTOPRAZOLE SODIUM 20 MG PO TBEC
20.0000 mg | DELAYED_RELEASE_TABLET | Freq: Two times a day (BID) | ORAL | Status: DC
  Administered 2023-02-19 – 2023-02-21 (×4): 20 mg via ORAL
  Filled 2023-02-19 (×5): qty 1

## 2023-02-19 MED ORDER — AZELASTINE HCL 0.1 % NA SOLN
2.0000 | Freq: Two times a day (BID) | NASAL | Status: DC
  Filled 2023-02-19: qty 30

## 2023-02-19 MED ORDER — FUROSEMIDE 10 MG/ML IJ SOLN
40.0000 mg | Freq: Once | INTRAMUSCULAR | Status: AC
  Administered 2023-02-19: 40 mg via INTRAVENOUS
  Filled 2023-02-19: qty 4

## 2023-02-19 MED ORDER — RAMELTEON 8 MG PO TABS: 8.0000 mg | ORAL_TABLET | Freq: Every evening | ORAL | Status: DC | PRN

## 2023-02-19 MED ORDER — FUROSEMIDE 10 MG/ML IJ SOLN
20.0000 mg | Freq: Two times a day (BID) | INTRAMUSCULAR | Status: DC
  Filled 2023-02-19: qty 2

## 2023-02-19 MED ORDER — ONDANSETRON HCL 4 MG PO TABS: 4.0000 mg | ORAL_TABLET | Freq: Four times a day (QID) | ORAL | Status: DC | PRN

## 2023-02-19 MED ORDER — IOHEXOL 350 MG/ML SOLN
100.0000 mL | Freq: Once | INTRAVENOUS | Status: AC | PRN
  Administered 2023-02-19: 75 mL via INTRAVENOUS

## 2023-02-19 MED ORDER — PREDNISONE 20 MG PO TABS
20.0000 mg | ORAL_TABLET | Freq: Three times a day (TID) | ORAL | Status: DC
  Administered 2023-02-19 – 2023-02-21 (×6): 20 mg via ORAL
  Filled 2023-02-19 (×6): qty 1

## 2023-02-19 MED ORDER — FLUTICASONE PROPIONATE 50 MCG/ACT NA SUSP
2.0000 | Freq: Every day | NASAL | Status: DC
  Filled 2023-02-19: qty 16

## 2023-02-19 MED ORDER — LISINOPRIL 2.5 MG PO TABS
2.5000 mg | ORAL_TABLET | Freq: Every day | ORAL | Status: DC
  Administered 2023-02-19 – 2023-02-20 (×2): 2.5 mg via ORAL
  Filled 2023-02-19 (×2): qty 1

## 2023-02-19 MED ORDER — SODIUM CHLORIDE (PF) 0.9 % IJ SOLN
INTRAMUSCULAR | Status: AC
  Filled 2023-02-19: qty 50

## 2023-02-19 MED ORDER — MONTELUKAST SODIUM 10 MG PO TABS
10.0000 mg | ORAL_TABLET | Freq: Every day | ORAL | Status: DC
  Administered 2023-02-19 – 2023-02-20 (×2): 10 mg via ORAL
  Filled 2023-02-19 (×2): qty 1

## 2023-02-19 NOTE — ED Triage Notes (Signed)
Patient arrived POV. Patient reports Went to [facility] this morning for SOB that started yesterday and got worse overnight. Chest Xray done and was advised to come to ED for pleural effusion AAOX4, respirations even and unlabored. NAD. Denies any symptoms.

## 2023-02-19 NOTE — Plan of Care (Signed)
  Problem: Education: Goal: Ability to demonstrate management of disease process will improve Outcome: Not Progressing Goal: Ability to verbalize understanding of medication therapies will improve Outcome: Not Progressing Goal: Individualized Educational Video(s) Outcome: Not Progressing

## 2023-02-19 NOTE — ED Notes (Signed)
ED TO INPATIENT HANDOFF REPORT  ED Nurse Name and Phone #: Tonilynn Bieker Woodroe Chen, Paramedic  S Name/Age/Gender Stuart Collins 57 y.o. male Room/Bed: WA01/WA01  Code Status   Code Status: Prior  Home/SNF/Other   Patient oriented to: self, place, time, and situation Is this baseline? Yes   Triage Complete: Triage complete  Chief Complaint Volume overload [E87.70]  Triage Note Patient arrived POV. Patient reports Went to [facility] this morning for SOB that started yesterday and got worse overnight. Chest Xray done and was advised to come to ED for pleural effusion AAOX4, respirations even and unlabored. NAD. Denies any symptoms.    Allergies Allergies  Allergen Reactions   Mushroom Extract Complex Shortness Of Breath and Other (See Comments)    All types mushrooms   Other Shortness Of Breath and Other (See Comments)    "ALL NUTS"   Peanut-Containing Drug Products Shortness Of Breath   Shellfish Allergy Hives, Shortness Of Breath and Other (See Comments)    All shellfish   Tape Rash and Other (See Comments)    The CLEAR, PLASTIC tape used in the hospital BREAKS OUT THE SKIN!!   Morphine Other (See Comments)     "makes my head feel like its stuffed; like it's going to pop"   Codeine Other (See Comments)     "makes my head feel like its stuffed; like it's going to pop"    Level of Care/Admitting Diagnosis ED Disposition     ED Disposition  Admit   Condition  --   Comment  Hospital Area: [facility] [[id number]]  Level of Care: Telemetry [5]  Admit to tele based on following criteria: Complex arrhythmia (Bradycardia/Tachycardia)  May place patient in observation at [facility] or [facility] if equivalent level of care is available:: No  Covid Evaluation: Asymptomatic - no recent exposure (last 10 days) testing not required  Diagnosis: Volume overload [161096]  Admitting Physician: Bobette Mo [[id number]]  Attending Physician: Bobette Mo  [[id number]]          B Medical/Surgery History Past Medical History:  Diagnosis Date   Allergic arthritis    Asthma, mild intermittent    followed by dr Elvera Lennox. Irena Cords ([place] [facility])   Coronary artery calcification seen on CAT scan 03/2014   cardiologist--- dr Rennis Golden;  calcium score=65.5 involving LAD   Dyspnea    due to weakness   Elevated liver enzymes    referred to [facility]--- dr Marca Ancona by pcp   Family history of adverse reaction to anesthesia    mother and father--- ponv   Family history of premature CAD    NUCLEAR STRESS TEST, 12/30/2002 - no evidence of ischemia   GERD (gastroesophageal reflux disease)    Hepatic steatosis determined by biopsy of liver 2003   History of basal cell carcinoma (BCC) excision 08/18/2014   12/ 2018 nodular right chest tx; cx3 35fu;   03/ 2023  right breast (curet and 5FU)   History of kidney stones    urologist--- dr Mena Goes   Hyperlipidemia, mixed    followed by dr hilty   Muscle weakness (generalized)    02-05-2023  per pt using cane   NASH (nonalcoholic steatohepatitis)    followed by dr Marca Ancona  (GI)   Nocturia    RA (rheumatoid arthritis) [facility])    rheumatologist--- dr Dierdre Forth;  multiple sites   Raynaud's disease    Sciatica, right side    Wears glasses  Past Surgical History:  Procedure Laterality Date   CYSTOSCOPY W/ URETERAL STENT PLACEMENT  12/02/2011   Procedure: CYSTOSCOPY WITH RETROGRADE PYELOGRAM/URETERAL STENT PLACEMENT;  Surgeon: Kathi Ludwig, MD;  Location: [facility];  Service: Urology;  Laterality: Right;   CYSTOSCOPY W/ URETERAL STENT PLACEMENT  12/15/2011   Procedure: CYSTOSCOPY WITH STENT REPLACEMENT;  Surgeon: Milford Cage, MD;  Location: [facility];  Service: Urology;  Laterality: Right;  right reter stent removal and stent placement   CYSTOSCOPY/RETROGRADE/URETEROSCOPY  12/15/2011   Procedure: CYSTOSCOPY/RETROGRADE/URETEROSCOPY;  Surgeon: Milford Cage, MD;  Location: [facility];   Service: Urology;  Laterality: Right;  no retrograde   LIVER BIOPSY  05/29/2002   MUSCLE BIOPSY Left 02/15/2023   Procedure: LEFT THIGH MUSCLE BIOPSY;  Surgeon: Fritzi Mandes, MD;  Location: [facility];  Service: General;  Laterality: Left;   ORCHIOPEXY  1970   TONSILLECTOMY  1970   UPPER GI ENDOSCOPY  03/17/2002     A IV Location/Drains/Wounds Patient Lines/Drains/Airways Status     Active Line/Drains/Airways     Name Placement date Placement time Site Days   Peripheral IV 02/19/23 20 G Anterior;Left Antecubital 02/19/23  1050  Antecubital  less than 1            Intake/Output Last 24 hours No intake or output data in the 24 hours ending 02/19/23 1539  Labs/Imaging Results for orders placed or performed during the hospital encounter of 02/19/23 (from the past 48 hour(s))  Comprehensive metabolic panel     Status: Abnormal   Collection Time: 02/19/23 10:52 AM  Result Value Ref Range   Sodium 133 (L) 135 - 145 mmol/L   Potassium 3.9 3.5 - 5.1 mmol/L   Chloride 106 98 - 111 mmol/L   CO2 20 (L) 22 - 32 mmol/L   Glucose, Bld 110 (H) 70 - 99 mg/dL    Comment: Glucose reference range applies only to samples taken after fasting for at least 8 hours.   BUN 43 (H) 6 - 20 mg/dL   Creatinine, Ser 5.28 0.61 - 1.24 mg/dL   Calcium 8.8 (L) 8.9 - 10.3 mg/dL   Total Protein 8.1 6.5 - 8.1 g/dL   Albumin 2.7 (L) 3.5 - 5.0 g/dL   AST 413 (H) 15 - 41 U/L   ALT 158 (H) 0 - 44 U/L   Alkaline Phosphatase 80 38 - 126 U/L   Total Bilirubin 0.4 0.3 - 1.2 mg/dL   GFR, Estimated >24 >40 mL/min    Comment: (NOTE) Calculated using the CKD-EPI Creatinine Equation (2021)    Anion gap 7 5 - 15    Comment: Performed at [facility], [address], [place], [place] [address]  Troponin I (High Sensitivity)     Status: Abnormal   Collection Time: 02/19/23 10:52 AM  Result Value Ref Range   Troponin I (High Sensitivity) 67 (H) <18 ng/L    Comment:  (NOTE) Elevated high sensitivity troponin I (hsTnI) values and significant  changes across serial measurements may suggest ACS but many other  chronic and acute conditions are known to elevate hsTnI results.  Refer to the "Links" section for chest pain algorithms and additional  guidance. Performed at [facility], [address] [place], [place] [address]   CBC with Differential     Status: Abnormal   Collection Time: 02/19/23 10:52 AM  Result Value Ref Range   WBC 10.6 (H) 4.0 - 10.5 K/uL   RBC 2.98 (L)  4.22 - 5.81 MIL/uL   Hemoglobin 8.9 (L) 13.0 - 17.0 g/dL   HCT 09.8 (L) 11.9 - 14.7 %   MCV 93.6 80.0 - 100.0 fL   MCH 29.9 26.0 - 34.0 pg   MCHC 31.9 30.0 - 36.0 g/dL   RDW 82.9 (H) 56.2 - 13.0 %   Platelets 253 150 - 400 K/uL   nRBC 0.7 (H) 0.0 - 0.2 %   Neutrophils Relative % 60 %   Neutro Abs 6.3 1.7 - 7.7 K/uL   Lymphocytes Relative 29 %   Lymphs Abs 3.1 0.7 - 4.0 K/uL   Monocytes Relative 9 %   Monocytes Absolute 1.0 0.1 - 1.0 K/uL   Eosinophils Relative 0 %   Eosinophils Absolute 0.0 0.0 - 0.5 K/uL   Basophils Relative 0 %   Basophils Absolute 0.0 0.0 - 0.1 K/uL   Immature Granulocytes 2 %   Abs Immature Granulocytes 0.17 (H) 0.00 - 0.07 K/uL    Comment: Performed at [facility], [address] [place], [place] [address]  Brain natriuretic peptide     Status: Abnormal   Collection Time: 02/19/23 10:52 AM  Result Value Ref Range   B Natriuretic Peptide 700.8 (H) 0.0 - 100.0 pg/mL    Comment: Performed at [facility], [address], [place], [place] [address]  CK     Status: None   Collection Time: 02/19/23 10:52 AM  Result Value Ref Range   Total CK 209 49 - 397 U/L    Comment: Performed at [facility], [address], [place], [place] [address]  Troponin I (High Sensitivity)     Status: Abnormal   Collection Time: 02/19/23 12:36 PM  Result Value Ref Range   Troponin I (High  Sensitivity) 48 (H) <18 ng/L    Comment: DELTA CHECK NOTED (NOTE) Elevated high sensitivity troponin I (hsTnI) values and significant  changes across serial measurements may suggest ACS but many other  chronic and acute conditions are known to elevate hsTnI results.  Refer to the "Links" section for chest pain algorithms and additional  guidance. Performed at [facility], [address], [place], [place] [address]    CT Angio Chest PE W and/or Wo Contrast  Result Date: 02/19/2023 CLINICAL DATA:  Pulmonary embolism (PE) suspected, high prob EXAM: CT ANGIOGRAPHY CHEST WITH CONTRAST TECHNIQUE: Multidetector CT imaging of the chest was performed using the standard protocol during bolus administration of intravenous contrast. Multiplanar CT image reconstructions and MIPs were obtained to evaluate the vascular anatomy. RADIATION DOSE REDUCTION: This exam was performed according to the departmental dose-optimization program which includes automated exposure control, adjustment of the mA and/or kV according to patient size and/or use of iterative reconstruction technique. CONTRAST:  75mL OMNIPAQUE IOHEXOL 350 MG/ML SOLN COMPARISON:  03/31/2014 FINDINGS: Cardiovascular: No filling defects in the pulmonary arteries to suggest pulmonary emboli. Heart is normal size. Aorta is normal caliber. Coronary artery calcifications in the left coronary arteries. Mediastinum/Nodes: No mediastinal, hilar, or axillary adenopathy. Trachea and esophagus are unremarkable. Thyroid unremarkable. Lungs/Pleura: Small bilateral pleural effusions. Bibasilar airspace opacities could reflect atelectasis or infiltrates. Upper Abdomen: No acute findings 11 mm gallstone within the gallbladder. Musculoskeletal: Chest wall soft tissues are unremarkable. No acute bony abnormality. Review of the MIP images confirms the above findings. IMPRESSION: No evidence of pulmonary embolus. Small bilateral pleural effusions. Bibasilar  atelectasis or infiltrates. Coronary artery disease. Cholelithiasis. Electronically Signed   By: Charlett Nose M.D.  On: 02/19/2023 13:33    Pending Labs Unresulted Labs (From admission, onward)     Start     Ordered   02/19/23 1051  TSH  Once,   URGENT        02/19/23 1050            Vitals/Pain Today's Vitals   02/19/23 1413 02/19/23 1430 02/19/23 1500 02/19/23 1530  BP: (!) 144/86 (!) 163/80 (!) 167/87 (!) 171/84  Pulse: 61  (!) 47 (!) 111  Resp: 18 19 15 16   Temp: 97.8 F (36.6 C)     TempSrc: Oral     SpO2: 97%  97% (!) 75%  Weight:      Height:      PainSc:        Isolation Precautions No active isolations  Medications Medications  iohexol (OMNIPAQUE) 350 MG/ML injection 100 mL (75 mLs Intravenous Contrast Given 02/19/23 1249)  furosemide (LASIX) injection 40 mg (40 mg Intravenous Given 02/19/23 1405)    Mobility walks with device     Focused Assessments    R Recommendations: See Admitting Provider Note  Report given to:   Additional Notes:

## 2023-02-19 NOTE — ED Provider Notes (Signed)
EMERGENCY DEPARTMENT AT [facility] Provider Note  CSN: [id number] Arrival date & time: 02/19/23 1020  Chief Complaint(s) No chief complaint on file.  HPI Stuart Collins is a 57 y.o. male with PMH RA, myositis of unknown origin hypoalbuminemia with edema who presents emergency room for evaluation of shortness of breath.  Patient recently discharged in the hospital to obtain an outpatient muscle biopsy and results are still pending.  He states that over the last 2 days he is gotten progressively more short of breath and has been using his albuterol inhaler that helped initially but has since stopped working.  He had significant worsening of his exertional shortness of breath this morning [facility] who obtained a chest x-ray showing bilateral pleural effusions and transferred him to the emergency department for further evaluation.  Here in the ER, he denies any chest pain, abdominal pain, nausea, vomiting or other systemic symptoms.  Has been compliant with his medication regimen.   Past Medical History Past Medical History:  Diagnosis Date   Allergic arthritis    Asthma, mild intermittent    followed by dr Elvera Lennox. Irena Cords ([place] [facility]/ asthma [facility])   Coronary artery calcification seen on CAT scan 03/2014   cardiologist--- dr Rennis Golden;  calcium score=65.5 involving LAD   Dyspnea    due to weakness   Elevated liver enzymes    referred to [facility] GI--- dr Marca Ancona by pcp   Family history of adverse reaction to anesthesia    mother and father--- ponv   Family history of premature CAD    NUCLEAR STRESS TEST, 12/30/2002 - no evidence of ischemia   GERD (gastroesophageal reflux disease)    Hepatic steatosis determined by biopsy of liver 2003   History of basal cell carcinoma (BCC) excision 08/18/2014   12/ 2018 nodular right chest tx; cx3 26fu;   03/ 2023  right breast (curet and 5FU)   History of kidney stones    urologist--- dr Mena Goes    Hyperlipidemia, mixed    followed by dr hilty   Muscle weakness (generalized)    02-05-2023  per pt using cane   NASH (nonalcoholic steatohepatitis)    followed by dr Marca Ancona  (GI)   Nocturia    RA (rheumatoid arthritis) [facility])    rheumatologist--- dr Dierdre Forth;  multiple sites   Raynaud's disease    Sciatica, right side    Wears glasses    Patient Active Problem List   Diagnosis Date Noted   Hyponatremia 02/12/2023   Allergic rhinitis 11/02/2021   Allergic rhinitis due to animal (cat) (dog) hair and dander 11/02/2021   Allergic rhinitis due to pollen 11/02/2021   Chronic allergic conjunctivitis 11/02/2021   Eosinophil count raised 11/02/2021   Exacerbation of intermittent asthma 11/02/2021   Seafood allergy 11/02/2021   Agatston coronary artery calcium score less than 100 02/28/2015   Hyperlipidemia 03/03/2013   Rheumatoid arthritis (HCC) 03/03/2013   Asthma 12/18/2011   Nausea 12/14/2011   Acute respiratory failure (HCC) 12/02/2011   Headache(784.0) 12/02/2011   Nephrolithiasis 12/01/2011   Hydroureter 12/01/2011   Arthritis 12/01/2011   Pneumonitis 12/01/2011   Home Medication(s) Prior to Admission medications   Medication Sig Start Date End Date Taking? Authorizing Provider  Abatacept 125 MG/ML SOSY Inject 125 mg into the skin once a week. On sundays    [provider]  Albuterol Sulfate (PROAIR RESPICLICK) 108 (90 Base) MCG/ACT AEPB Inhale 1 puff into the lungs every 6 (  six) hours as needed (wheezing).    [provider]  AUVI-Q 0.3 MG/0.3ML SOAJ injection Inject 0.3 mg into the muscle as needed for anaphylaxis. Patient not taking: Reported on 02/12/2023 09/28/21   [provider]  azelastine (ASTELIN) 137 MCG/SPRAY nasal spray Place 2 sprays into the nose daily. Use in each nostril as directed     [provider]  BEPREVE 1.5 % SOLN Place 1 drop into both eyes 2 (two) times daily as needed for allergies. 10/25/17   [provider]   cetirizine (ZYRTEC) 10 MG tablet Take 10 mg by mouth at bedtime.     [provider]  fluticasone (FLONASE) 50 MCG/ACT nasal spray Place 2 sprays into the nose 2 (two) times daily.    [provider]  furosemide (LASIX) 40 MG tablet Take 1 tablet (40 mg total) by mouth once for 1 dose. Follow up with your PCP or rheumatologist for repeat labs within 1 week 02/14/23 02/14/23  Zigmund Daniel., MD  lansoprazole (PREVACID) 30 MG capsule Take 30 mg by mouth 2 (two) times daily before a meal.    [provider]  montelukast (SINGULAIR) 10 MG tablet Take 1 tablet by mouth at bedtime. 01/30/16   [provider]  Multiple Vitamin (MULITIVITAMIN WITH MINERALS) TABS Take 1 tablet by mouth daily.    [provider]  Potassium Citrate 15 MEQ (1620 MG) TBCR Take 15 mEq by mouth daily. 01/14/13   [provider]  ramelteon (ROZEREM) 8 MG tablet Take 8 mg by mouth at bedtime as needed for sleep. For sleep    [provider]  sucralfate (CARAFATE) 1 g tablet Take 1 g by mouth 2 (two) times daily. 11/14/21   [provider]  triamcinolone cream (KENALOG) 0.1 % Apply topically daily. Patient taking differently: Apply 1 Application topically daily as needed. 07/20/20   Janalyn Harder, MD                                                                                                                                    Past Surgical History Past Surgical History:  Procedure Laterality Date   CYSTOSCOPY W/ URETERAL STENT PLACEMENT  12/02/2011   Procedure: CYSTOSCOPY WITH RETROGRADE PYELOGRAM/URETERAL STENT PLACEMENT;  Surgeon: Kathi Ludwig, MD;  Location: [facility];  Service: Urology;  Laterality: Right;   CYSTOSCOPY W/ URETERAL STENT PLACEMENT  12/15/2011   Procedure: CYSTOSCOPY WITH STENT REPLACEMENT;  Surgeon: Milford Cage, MD;  [facility];  Service: Urology;  Laterality: Right;  right reter stent removal and stent placement    CYSTOSCOPY/RETROGRADE/URETEROSCOPY  12/15/2011   Procedure: CYSTOSCOPY/RETROGRADE/URETEROSCOPY;  Surgeon: Milford Cage, MD;  [facility];  Service: Urology;  Laterality: Right;  no retrograde   LIVER BIOPSY  05/29/2002   MUSCLE BIOPSY Left 02/15/2023   Procedure: LEFT THIGH MUSCLE BIOPSY;  Surgeon: Fritzi Mandes, MD;  Location: [facility];  Service: General;  Laterality: Left;   ORCHIOPEXY  1970   TONSILLECTOMY  1970   UPPER GI ENDOSCOPY  03/17/2002   Family History Family History  Problem Relation Age of Onset   Kidney Stones Mother    Arthritis Mother 63       Osteo   Heart disease Father 71   Heart attack Maternal Grandfather 52   Hyperlipidemia Brother     Social History Social History   Tobacco Use   Smoking status: Former    Years: 4    Types: Cigarettes    Quit date: 1980    Years since quitting: 44.4   Smokeless tobacco: Never  Vaping Use   Vaping Use: Never used  Substance Use Topics   Alcohol use: Not Currently   Drug use: Never   Allergies Mushroom extract complex, Peanut-containing drug products, Shellfish allergy, Morphine, Codeine, and Morphine and codeine  Review of Systems Review of Systems  Respiratory:  Positive for shortness of breath.     Physical Exam Vital Signs  I have reviewed the triage vital signs BP (!) 147/83 (BP Location: Right Arm)   Pulse (!) 50   Temp 98.2 F (36.8 C) (Oral)   Resp 18   Ht 5\' 9"  (1.753 m)   Wt 71.7 kg   SpO2 98%   BMI 23.33 kg/m   Physical Exam Constitutional:      General: He is not in acute distress.    Appearance: Normal appearance.  HENT:     Head: Normocephalic and atraumatic.     Nose: No congestion or rhinorrhea.  Eyes:     General:        Right eye: No discharge.        Left eye: No discharge.     Extraocular Movements: Extraocular movements intact.     Pupils: Pupils are equal, round, and reactive to light.  Cardiovascular:     Rate and Rhythm: Normal  rate and regular rhythm.     Heart sounds: No murmur heard. Pulmonary:     Effort: No respiratory distress.     Breath sounds: No wheezing or rales.  Abdominal:     General: There is no distension.     Tenderness: There is no abdominal tenderness.  Musculoskeletal:        General: Normal range of motion.     Cervical back: Normal range of motion.  Skin:    General: Skin is warm and dry.  Neurological:     General: No focal deficit present.     Mental Status: He is alert.     ED Results and Treatments Labs (all labs ordered are listed, but only abnormal results are displayed) Labs Reviewed  COMPREHENSIVE METABOLIC PANEL  CBC WITH DIFFERENTIAL/PLATELET  BRAIN NATRIURETIC PEPTIDE  CK  TROPONIN I (HIGH SENSITIVITY)  Radiology No results found.  Pertinent labs & imaging results that were available during my care of the patient were reviewed by me and considered in my medical decision making (see MDM for details).  Medications Ordered in ED Medications - No data to display                                                                                                                                   Procedures Procedures  (including critical care time)  Medical Decision Making / ED Course   This patient presents to the ED for concern of dyspnea on exertion, this involves an extensive number of treatment options, and is a complaint that carries with it a high risk of complications and morbidity.  The differential diagnosis includes Pe, PTX, Pulmonary Edema, ARDS, COPD/Asthma, ACS, CHF exacerbation, Arrhythmia, Pericardial Effusion/Tamponade, Anemia, Sepsis, Acidosis/Hypercapnia, Anxiety, Viral URI  MDM: Patient seen emergency room for evaluation of shortness of breath on exertion.  Physical exam with proximal muscle weakness consistent with his previous  hospitalization but is overall otherwise unremarkable outside of a new bradycardia.  ECG with sinus bradycardia but no evidence of heart block.  Laboratory evaluation with a leukocytosis to 10.6, hemoglobin 10.9, improvement of his hyponatremia to 133, improvement of his hypoalbuminemia to 2.7, AST 151, ALT 158, improvement of his CK to 209 but BNP is significantly elevated to 700.8.  CT PE obtained given persistent shortness of breath on exertion and prolonged hospitalization increasing his risk for thromboembolism.  CT PE reassuringly negative for PE but does show small pleural effusions.  Diuresis begun and given persistent shortness of breath on exertion patient require hospital admission for diuresis.   Additional history obtained:  -External records from outside source obtained and reviewed including: Chart review including previous notes, labs, imaging, consultation notes   Lab Tests: -I ordered, reviewed, and interpreted labs.   The pertinent results include:   Labs Reviewed  COMPREHENSIVE METABOLIC PANEL  CBC WITH DIFFERENTIAL/PLATELET  BRAIN NATRIURETIC PEPTIDE  CK  TROPONIN I (HIGH SENSITIVITY)      EKG   EKG Interpretation  Date/Time:  Tuesday February 19 2023 10:47:03 EDT Ventricular Rate:  58 PR Interval:  142 QRS Duration: 101 QT Interval:  454 QTC Calculation: 446 R Axis:   67 Text Interpretation: Sinus arrhythmia Borderline T abnormalities, anterior leads Confirmed by Aeralyn Barna ([id number]) on 02/19/2023 10:50:25 AM         Imaging Studies ordered: I ordered imaging studies including CT PE I independently visualized and interpreted imaging. I agree with the radiologist interpretation   Medicines ordered and prescription drug management: No orders of the defined types were placed in this encounter.   -I have reviewed the patients home medicines and have made adjustments as needed  Critical interventions none    Cardiac Monitoring: The patient was  maintained on a cardiac monitor.  I personally viewed and interpreted the cardiac monitored which  showed an underlying rhythm of: Sinus bradycardia  Social Determinants of Health:  Factors impacting patients care include: none   Reevaluation: After the interventions noted above, I reevaluated the patient and found that they have :improved  Co morbidities that complicate the patient evaluation  Past Medical History:  Diagnosis Date   Allergic arthritis    Asthma, mild intermittent    followed by dr Elvera Lennox. Irena Cords ( allergy/ asthma center)   Coronary artery calcification seen on CAT scan 03/2014   cardiologist--- dr Rennis Golden;  calcium score=65.5 involving LAD   Dyspnea    due to weakness   Elevated liver enzymes    referred to [facility]--- dr Marca Ancona by pcp   Family history of adverse reaction to anesthesia    mother and father--- ponv   Family history of premature CAD    NUCLEAR STRESS TEST, 12/30/2002 - no evidence of ischemia   GERD (gastroesophageal reflux disease)    Hepatic steatosis determined by biopsy of liver 2003   History of basal cell carcinoma (BCC) excision 08/18/2014   12/ 2018 nodular right chest tx; cx3 70fu;   03/ 2023  right breast (curet and 5FU)   History of kidney stones    urologist--- dr Mena Goes   Hyperlipidemia, mixed    followed by dr hilty   Muscle weakness (generalized)    02-05-2023  per pt using cane   NASH (nonalcoholic steatohepatitis)    followed by dr Marca Ancona  (GI)   Nocturia    RA (rheumatoid arthritis) [facility])    rheumatologist--- dr Dierdre Forth;  multiple sites   Raynaud's disease    Sciatica, right side    Wears glasses       Dispostion: I considered admission for this patient, and due to persistent shortness of breath on exertion patient require hospitalization     Final Clinical Impression(s) / ED Diagnoses Final diagnoses:  None     @PCDICTATION @    Glendora Score, MD 02/20/23 [phone]

## 2023-02-19 NOTE — H&P (Addendum)
History and Physical    Patient: Stuart Collins ZOX:[id number] DOB: 07-29-1966 DOA: 02/19/2023 DOS: the patient was seen and examined on 02/19/2023 PCP: Ralene Ok, MD  Patient coming from: Home  Chief Complaint: Shortness of breath.  HPI: Stuart Collins is a 57 y.o. male with medical history significant of allergic rhinitis, mild intermittent asthma, CAD, transaminitis, NASH, GERD, basal cell carcinoma, nephrolithiasis, hyperlipidemia, rheumatoid arthritis, Raynaud's disease, right sciatica who presented to the emergency department with complaints of progressively worse dyspnea since yesterday.  He also has a nonproductive cough, lower extremity edema and orthopnea.  [facility] and was advised to come to the emergency department.  No sodium or water dietary indiscretions.  He has been taking furosemide as usual.  No chest pain, palpitations, diaphoresis or PND.Marland KitchenHe denied fever, chills, rhinorrhea, sore throat.  Occasional wheezing, but no hemoptysis.  No abdominal pain, nausea, emesis, diarrhea, constipation, melena or hematochezia.  No flank pain, dysuria, frequency or hematuria.  No polyuria, polydipsia, polyphagia or blurred vision.   ED course: Initial vital signs were temperature 98.2 F, pulse 50, respiration 18, BP 147/82 mmHg O2 sat 98% on room air.  The patient received furosemide 40 mg IVP.  Lab work: CBC showed a white count of 10.6, hemoglobin 8.9 g/dL platelets 811.  Troponin was 67 then 48 ng/L.  BNP 700.8 pg/mL.  Total CK was normal.  CMP with a sodium 133 and CO2 of 20 mmol/L with a normal anion gap.  The rest of the electrolytes are normal after calcium correction.  Glucose 110, BUN 43 and creatinine 0.97 mg/dL.  Total protein 8.1 and albumin 2.7 g/dL.  AST 851 and ALT 158 units/L.  Normal alkaline phosphatase and total bilirubin.  Imaging: CTA chest with no evidence of PE.  There is small bilateral pleural effusions.  Bilateral atelectasis or infiltrates.  CAD,  cholelithiasis.   Review of Systems: As mentioned in the history of present illness. All other systems reviewed and are negative. Past Medical History:  Diagnosis Date   Allergic arthritis    Asthma, mild intermittent    followed by dr Elvera Lennox. Irena Cords ([place] [facility])   Coronary artery calcification seen on CAT scan 03/2014   cardiologist--- dr Rennis Golden;  calcium score=65.5 involving LAD   Dyspnea    due to weakness   Elevated liver enzymes    referred to [facility]--- dr Marca Ancona by pcp   Family history of adverse reaction to anesthesia    mother and father--- ponv   Family history of premature CAD    NUCLEAR STRESS TEST, 12/30/2002 - no evidence of ischemia   GERD (gastroesophageal reflux disease)    Hepatic steatosis determined by biopsy of liver 2003   History of basal cell carcinoma (BCC) excision 08/18/2014   12/ 2018 nodular right chest tx; cx3 53fu;   03/ 2023  right breast (curet and 5FU)   History of kidney stones    urologist--- dr Mena Goes   Hyperlipidemia, mixed    followed by dr hilty   Muscle weakness (generalized)    02-05-2023  per pt using cane   NASH (nonalcoholic steatohepatitis)    followed by dr Marca Ancona  (GI)   Nocturia    RA (rheumatoid arthritis) [facility])    rheumatologist--- dr Dierdre Forth;  multiple sites   Raynaud's disease    Sciatica, right side    Wears glasses    Past Surgical History:  Procedure Laterality Date   CYSTOSCOPY W/  URETERAL STENT PLACEMENT  12/02/2011   Procedure: CYSTOSCOPY WITH RETROGRADE PYELOGRAM/URETERAL STENT PLACEMENT;  Surgeon: Kathi Ludwig, MD;  Location: [facility];  Service: Urology;  Laterality: Right;   CYSTOSCOPY W/ URETERAL STENT PLACEMENT  12/15/2011   Procedure: CYSTOSCOPY WITH STENT REPLACEMENT;  Surgeon: Milford Cage, MD;  Location: [facility];  Service: Urology;  Laterality: Right;  right reter stent removal and stent placement   CYSTOSCOPY/RETROGRADE/URETEROSCOPY  12/15/2011   Procedure:  CYSTOSCOPY/RETROGRADE/URETEROSCOPY;  Surgeon: Milford Cage, MD;  Location: [facility];  Service: Urology;  Laterality: Right;  no retrograde   LIVER BIOPSY  05/29/2002   MUSCLE BIOPSY Left 02/15/2023   Procedure: LEFT THIGH MUSCLE BIOPSY;  Surgeon: Fritzi Mandes, MD;  Location: [facility];  Service: General;  Laterality: Left;   ORCHIOPEXY  1970   TONSILLECTOMY  1970   UPPER GI ENDOSCOPY  03/17/2002   Social History:  reports that he quit smoking about 44 years ago. His smoking use included cigarettes. He has never used smokeless tobacco. He reports that he does not currently use alcohol. He reports that he does not use drugs.  Allergies  Allergen Reactions   Mushroom Extract Complex Shortness Of Breath    All types mushrooms   Peanut-Containing Drug Products Shortness Of Breath    All nuts   Shellfish Allergy Shortness Of Breath and Hives    All shellfish   Morphine Other (See Comments)    REACTION: "makes my head feel like its stuffed; like it's going to pop"   Codeine Other (See Comments)    REACTION: "makes my head feel like its stuffed; like it's going to pop"   Morphine And Codeine     REACTION: "makes my head feel like its stuffed; like it's going to pop"    Family History  Problem Relation Age of Onset   Kidney Stones Mother    Arthritis Mother 55       Osteo   Heart disease Father 7   Heart attack Maternal Grandfather 6   Hyperlipidemia Brother     Prior to Admission medications   Medication Sig Start Date End Date Taking? Authorizing Provider  Abatacept 125 MG/ML SOSY Inject 125 mg into the skin once a week. On sundays    [provider]  Albuterol Sulfate (PROAIR RESPICLICK) 108 (90 Base) MCG/ACT AEPB Inhale 1 puff into the lungs every 6 (six) hours as needed (wheezing).    [provider]  AUVI-Q 0.3 MG/0.3ML SOAJ injection Inject 0.3 mg into the muscle as needed for anaphylaxis. Patient not taking: Reported on 02/12/2023  09/28/21   [provider]  azelastine (ASTELIN) 137 MCG/SPRAY nasal spray Place 2 sprays into the nose daily. Use in each nostril as directed     [provider]  BEPREVE 1.5 % SOLN Place 1 drop into both eyes 2 (two) times daily as needed for allergies. 10/25/17   [provider]  cetirizine (ZYRTEC) 10 MG tablet Take 10 mg by mouth at bedtime.     [provider]  fluticasone (FLONASE) 50 MCG/ACT nasal spray Place 2 sprays into the nose 2 (two) times daily.    [provider]  furosemide (LASIX) 40 MG tablet Take 1 tablet (40 mg total) by mouth once for 1 dose. Follow up with your PCP or rheumatologist for repeat labs within 1 week 02/14/23 02/14/23  Zigmund Daniel., MD  lansoprazole (PREVACID) 30 MG capsule Take 30 mg by mouth 2 (two) times  daily before a meal.    [provider]  montelukast (SINGULAIR) 10 MG tablet Take 1 tablet by mouth at bedtime. 01/30/16   [provider]  Multiple Vitamin (MULITIVITAMIN WITH MINERALS) TABS Take 1 tablet by mouth daily.    [provider]  Potassium Citrate 15 MEQ (1620 MG) TBCR Take 15 mEq by mouth daily. 01/14/13   [provider]  ramelteon (ROZEREM) 8 MG tablet Take 8 mg by mouth at bedtime as needed for sleep. For sleep    [provider]  sucralfate (CARAFATE) 1 g tablet Take 1 g by mouth 2 (two) times daily. 11/14/21   [provider]  triamcinolone cream (KENALOG) 0.1 % Apply topically daily. Patient taking differently: Apply 1 Application topically daily as needed. 07/20/20   Janalyn Harder, MD    Physical Exam: Vitals:   02/19/23 1026 02/19/23 1030 02/19/23 1300  BP:  (!) 147/83 (!) 162/98  Pulse:  (!) 50 (!) 57  Resp:  18 15  Temp:  98.2 F (36.8 C)   TempSrc:  Oral   SpO2:  98% 95%  Weight: 71.7 kg    Height: 5\' 9"  (1.753 m)     Physical Exam Vitals and nursing note reviewed.  Constitutional:      General: He is awake. He is not in  acute distress.    Appearance: Normal appearance.  HENT:     Head: Normocephalic.     Nose: No rhinorrhea.     Mouth/Throat:     Mouth: Mucous membranes are moist.  Eyes:     General: No scleral icterus.    Pupils: Pupils are equal, round, and reactive to light.  Neck:     Vascular: No JVD.  Cardiovascular:     Rate and Rhythm: Regular rhythm. Bradycardia present.     Heart sounds: S1 normal and S2 normal.  Pulmonary:     Effort: Pulmonary effort is normal. No tachypnea or accessory muscle usage.     Breath sounds: Examination of the right-lower field reveals rales. Examination of the left-lower field reveals rales. Rales present. No wheezing or rhonchi.  Abdominal:     General: Bowel sounds are normal. There is no distension.     Palpations: Abdomen is soft.     Tenderness: There is no abdominal tenderness.  Musculoskeletal:     Cervical back: Neck supple.     Right lower leg: Edema present.     Left lower leg: Edema present.  Skin:    General: Skin is warm and dry.     Findings: Erythema and rash present.     Comments: Erythematous rash to lower extremities (The patient stated that this rash is improving from last week).  Neurological:     General: No focal deficit present.     Mental Status: He is alert and oriented to person, place, and time.  Psychiatric:        Mood and Affect: Mood normal.        Behavior: Behavior normal. Behavior is cooperative.    Data Reviewed:  There are no new results to review at this time.  02/13/2023 transthoracic echocardiogram: IMPRESSIONS:   1. Left ventricular ejection fraction, by estimation, is 65 to 70%. The  left ventricle has normal function. The left ventricle has no regional  wall motion abnormalities. Left ventricular diastolic parameters were  normal.   2. Right ventricular systolic function is normal. The right ventricular  size is mildly enlarged.   3. The mitral  valve is normal in structure. Mild mitral valve   regurgitation. No evidence of mitral stenosis.   4. The aortic valve is tricuspid. Aortic valve regurgitation is not  visualized. No aortic stenosis is present.   5. The inferior vena cava is normal in size with greater than 50%  respiratory variability, suggesting right atrial pressure of 3 mmHg.   Assessment and Plan: Principal Problem:   Volume overload With: Hyponatremia In the setting of:   Acute on chronic diastolic congestive heart failure (HCC) Observation/telemetry. Supplemental oxygen as needed. Sodium and fluid restriction. Continue furosemide 20 mg IVP twice daily. Begin lisinopril 2.5 mg p.o. daily. No beta-blocker due to bradycardia. Monitor daily weights, intake and output. Monitor renal function electrolytes. Cardiology master has added him to the consult list.  Active Problems:   Elevated troponin Likely demand ischemia. No chest pain at this time. Repeat echo if if needed/per cardiology    Sinus bradycardia No negative chronotropic meds. Will monitor closely. Cardiology will see in AM.    Asthma Albuterol MDI as needed. Continue montelukast 10 mg p.o. daily.    Hyperlipidemia Not on statin at this time.    Allergic rhinitis Continue Flonase as needed.    Transaminitis Chronic. Has seen GI. Monitor LFTs.    Myositis Recently started on prednisone 20 mg p.o. TID. Follow-up with rheumatology as an outpatient.    Normocytic anemia Monitor hematocrit and hemoglobin.    Moderate protein malnutrition (HCC) Protein supplementation. Consider nutritional services evaluation.     Advance Care Planning:   Code Status: Full Code   Consults: Cardiology consult in AM.  Family Communication:   Severity of Illness: The appropriate patient status for this patient is OBSERVATION. Observation status is judged to be reasonable and necessary in order to provide the required intensity of service to ensure the patient's safety. The patient's presenting  symptoms, physical exam findings, and initial radiographic and laboratory data in the context of their medical condition is felt to place them at decreased risk for further clinical deterioration. Furthermore, it is anticipated that the patient will be medically stable for discharge from the hospital within 2 midnights of admission.   Author: Bobette Mo, MD 02/19/2023 2:09 PM  For on call review [email or web address].   This document was prepared using Dragon voice recognition software and may contain some unintended transcription errors.

## 2023-02-20 ENCOUNTER — Observation Stay (HOSPITAL_COMMUNITY): Payer: Managed Care, Other (non HMO)

## 2023-02-20 DIAGNOSIS — E871 Hypo-osmolality and hyponatremia: Secondary | ICD-10-CM | POA: Diagnosis present

## 2023-02-20 DIAGNOSIS — I110 Hypertensive heart disease with heart failure: Secondary | ICD-10-CM | POA: Diagnosis present

## 2023-02-20 DIAGNOSIS — D72829 Elevated white blood cell count, unspecified: Secondary | ICD-10-CM | POA: Diagnosis present

## 2023-02-20 DIAGNOSIS — Z87891 Personal history of nicotine dependence: Secondary | ICD-10-CM | POA: Diagnosis not present

## 2023-02-20 DIAGNOSIS — E8809 Other disorders of plasma-protein metabolism, not elsewhere classified: Secondary | ICD-10-CM | POA: Diagnosis present

## 2023-02-20 DIAGNOSIS — E440 Moderate protein-calorie malnutrition: Secondary | ICD-10-CM | POA: Diagnosis present

## 2023-02-20 DIAGNOSIS — E782 Mixed hyperlipidemia: Secondary | ICD-10-CM | POA: Diagnosis present

## 2023-02-20 DIAGNOSIS — D649 Anemia, unspecified: Secondary | ICD-10-CM | POA: Diagnosis present

## 2023-02-20 DIAGNOSIS — Z8249 Family history of ischemic heart disease and other diseases of the circulatory system: Secondary | ICD-10-CM | POA: Diagnosis not present

## 2023-02-20 DIAGNOSIS — M3320 Polymyositis, organ involvement unspecified: Secondary | ICD-10-CM | POA: Diagnosis present

## 2023-02-20 DIAGNOSIS — R7989 Other specified abnormal findings of blood chemistry: Secondary | ICD-10-CM

## 2023-02-20 DIAGNOSIS — Z6821 Body mass index (BMI) 21.0-21.9, adult: Secondary | ICD-10-CM | POA: Diagnosis not present

## 2023-02-20 DIAGNOSIS — I5033 Acute on chronic diastolic (congestive) heart failure: Secondary | ICD-10-CM

## 2023-02-20 DIAGNOSIS — I272 Pulmonary hypertension, unspecified: Secondary | ICD-10-CM | POA: Diagnosis present

## 2023-02-20 DIAGNOSIS — R601 Generalized edema: Secondary | ICD-10-CM | POA: Diagnosis not present

## 2023-02-20 DIAGNOSIS — E8770 Fluid overload, unspecified: Secondary | ICD-10-CM | POA: Diagnosis present

## 2023-02-20 DIAGNOSIS — Z79899 Other long term (current) drug therapy: Secondary | ICD-10-CM | POA: Diagnosis not present

## 2023-02-20 DIAGNOSIS — I7300 Raynaud's syndrome without gangrene: Secondary | ICD-10-CM | POA: Diagnosis present

## 2023-02-20 DIAGNOSIS — K7581 Nonalcoholic steatohepatitis (NASH): Secondary | ICD-10-CM | POA: Diagnosis present

## 2023-02-20 DIAGNOSIS — J4520 Mild intermittent asthma, uncomplicated: Secondary | ICD-10-CM | POA: Diagnosis present

## 2023-02-20 DIAGNOSIS — Z5982 Transportation insecurity: Secondary | ICD-10-CM | POA: Diagnosis not present

## 2023-02-20 DIAGNOSIS — Z83438 Family history of other disorder of lipoprotein metabolism and other lipidemia: Secondary | ICD-10-CM | POA: Diagnosis not present

## 2023-02-20 DIAGNOSIS — E8779 Other fluid overload: Secondary | ICD-10-CM

## 2023-02-20 DIAGNOSIS — Z8261 Family history of arthritis: Secondary | ICD-10-CM | POA: Diagnosis not present

## 2023-02-20 DIAGNOSIS — I2510 Atherosclerotic heart disease of native coronary artery without angina pectoris: Secondary | ICD-10-CM | POA: Diagnosis present

## 2023-02-20 DIAGNOSIS — Z85828 Personal history of other malignant neoplasm of skin: Secondary | ICD-10-CM | POA: Diagnosis not present

## 2023-02-20 DIAGNOSIS — M069 Rheumatoid arthritis, unspecified: Secondary | ICD-10-CM | POA: Diagnosis present

## 2023-02-20 DIAGNOSIS — I2489 Other forms of acute ischemic heart disease: Secondary | ICD-10-CM | POA: Diagnosis present

## 2023-02-20 LAB — ECHOCARDIOGRAM LIMITED
Area-P 1/2: 3.72 cm2
Calc EF: 70.9 %
Height: 69 in
S' Lateral: 2.8 cm
Single Plane A2C EF: 75.7 %
Single Plane A4C EF: 66.6 %
Weight: 2451.52 oz

## 2023-02-20 LAB — T4, FREE: Free T4: 0.86 ng/dL (ref 0.61–1.12)

## 2023-02-20 LAB — CBC
HCT: 25.5 % — ABNORMAL LOW (ref 39.0–52.0)
Hemoglobin: 8.4 g/dL — ABNORMAL LOW (ref 13.0–17.0)
MCH: 30.5 pg (ref 26.0–34.0)
MCHC: 32.9 g/dL (ref 30.0–36.0)
MCV: 92.7 fL (ref 80.0–100.0)
Platelets: 199 10*3/uL (ref 150–400)
RBC: 2.75 MIL/uL — ABNORMAL LOW (ref 4.22–5.81)
RDW: 16.1 % — ABNORMAL HIGH (ref 11.5–15.5)
WBC: 9.1 10*3/uL (ref 4.0–10.5)
nRBC: 1.3 % — ABNORMAL HIGH (ref 0.0–0.2)

## 2023-02-20 LAB — BASIC METABOLIC PANEL
Anion gap: 6 (ref 5–15)
BUN: 39 mg/dL — ABNORMAL HIGH (ref 6–20)
CO2: 19 mmol/L — ABNORMAL LOW (ref 22–32)
Calcium: 8.6 mg/dL — ABNORMAL LOW (ref 8.9–10.3)
Chloride: 106 mmol/L (ref 98–111)
Creatinine, Ser: 0.87 mg/dL (ref 0.61–1.24)
GFR, Estimated: >60 mL/min (ref >60)
Glucose, Bld: 113 mg/dL — ABNORMAL HIGH (ref 70–99)
Potassium: 4.2 mmol/L (ref 3.5–5.1)
Sodium: 131 mmol/L — ABNORMAL LOW (ref 135–145)

## 2023-02-20 MED ORDER — FUROSEMIDE 10 MG/ML IJ SOLN
40.0000 mg | Freq: Two times a day (BID) | INTRAMUSCULAR | Status: DC
  Administered 2023-02-20 – 2023-02-21 (×3): 40 mg via INTRAVENOUS
  Filled 2023-02-20 (×3): qty 4

## 2023-02-20 MED ORDER — ENOXAPARIN SODIUM 40 MG/0.4ML IJ SOSY
40.0000 mg | PREFILLED_SYRINGE | INTRAMUSCULAR | Status: DC
  Administered 2023-02-20: 40 mg via SUBCUTANEOUS
  Filled 2023-02-20: qty 0.4

## 2023-02-20 MED ORDER — SPIRONOLACTONE 12.5 MG HALF TABLET
12.5000 mg | ORAL_TABLET | Freq: Every day | ORAL | Status: DC
  Administered 2023-02-20 – 2023-02-21 (×2): 12.5 mg via ORAL
  Filled 2023-02-20 (×2): qty 1

## 2023-02-20 NOTE — Progress Notes (Signed)
  Echocardiogram 2D Echocardiogram has been performed.  Stuart Collins 02/20/2023, 9:22 AM

## 2023-02-20 NOTE — Consult Note (Signed)
Cardiology Consultation   Patient ID: Stuart Collins MRN: [id number]; DOB: 1966-06-06  Admit date: 02/19/2023 Date of Consult: 02/20/2023  PCP:  Ralene Ok, MD   [facility] Providers Cardiologist:  Chrystie Nose, MD     Patient Profile:   Stuart Collins is a 57 y.o. male with a hx of elevated coronary calcium score, allergic rhinitis, mild intermittent asthma, NASH, transaminitis, GERD, basal cell carcinoma, HLD, rheumatoid arthritis, myositis, Raynaud's disease, right sciatica who is being seen 02/20/2023 for the evaluation of shortness of breath at the request of Dr. Sharl Ma.  History of Present Illness:   Stuart Collins is a 57 year old male with above medical history who is followed by Dr. Rennis Golden.  Per chart review, patient previously had an echocardiogram in 2013 that showed EF 60-65%, no regional wall motion abnormalities, normal left and right heart filling pressures. He later underwent CT calcium scoring on 03/31/14 that showed a coronary calcium score of 65.5 (75th percentile).  He has been followed by Dr. Rennis Golden for cholesterol management, and he is followed closely by [facility] for elevated liver enzymes.  His elevated liver enzymes have limited his cholesterol therapy.   He was recently admitted to [facility] hospital from 5/28-5/30/24. He presented to the ED after a fall at home, and was found to be hyponatremic with Na as low as 122. He was also noted to have lower extremity edema and other sings of hypervolemia, and he was diagnosed with hypervolemic hyponatremia. He was diuresed with IV lasix and was discharged with PO lasix 40 mg daily. Of note, patient was discharged on 5/30 so that patient could present for an outpatient incisional biopsy for evaluation of myositis. Per DC summary, ideally patient would have diuresed a bit more prior to DC, but his Na was relatively stable and it was important that patient undergo muscle biopsy. Echocardiogram on 5/29 showed EF 65-70%, no regional wall  motion abnormalities, normal LV diastolic parameters, normal RV systolic function, mild mitral valve regurgitation.   Patient presented to the ED on 6/4 complaining of shortness of breath.  Labs in the ED significant for Na 133, K3.9, creatinine 0.97, albumin 2.7, AST 151, ALT 158, WBC 10.6, hemoglobin 8.9, platelets 253.  High-sensitivity troponin 67, 48.  BNP elevated to 700.  CTA chest showed no evidence of PE, small bilateral pleural effusions. Patient was admitted to the internal medicine service and was started on IV lasix. Cardiology consulted for volume overload.   On interview, patient reports that his breathing feels better than yesterday, but is not yet back down to his baseline. He is followed by a GI doctor for his "fatty liver". Reports having some ankle edema at baseline, but that his ankle edema is worse than usual today.    Past Medical History:  Diagnosis Date   Allergic arthritis    Asthma, mild intermittent    followed by dr Elvera Lennox. Irena Cords ([facility])   Coronary artery calcification seen on CAT scan 03/2014   cardiologist--- dr Rennis Golden;  calcium score=65.5 involving LAD   Dyspnea    due to weakness   Elevated liver enzymes    referred to [facility]--- dr Marca Ancona by pcp   Family history of adverse reaction to anesthesia    mother and father--- ponv   Family history of premature CAD    NUCLEAR STRESS TEST, 12/30/2002 - no evidence of ischemia   GERD (gastroesophageal reflux disease)    Hepatic steatosis determined by biopsy  of liver 2003   History of basal cell carcinoma (BCC) excision 08/18/2014   12/ 2018 nodular right chest tx; cx3 19fu;   03/ 2023  right breast (curet and 5FU)   History of kidney stones    urologist--- dr Mena Goes   Hyperlipidemia, mixed    followed by dr hilty   Muscle weakness (generalized)    02-05-2023  per pt using cane   NASH (nonalcoholic steatohepatitis)    followed by dr Marca Ancona  (GI)   Nocturia    RA (rheumatoid arthritis)  [facility])    rheumatologist--- dr Dierdre Forth;  multiple sites   Raynaud's disease    Sciatica, right side    Wears glasses     Past Surgical History:  Procedure Laterality Date   CYSTOSCOPY W/ URETERAL STENT PLACEMENT  12/02/2011   Procedure: CYSTOSCOPY WITH RETROGRADE PYELOGRAM/URETERAL STENT PLACEMENT;  Surgeon: Kathi Ludwig, MD;  Location: [facility];  Service: Urology;  Laterality: Right;   CYSTOSCOPY W/ URETERAL STENT PLACEMENT  12/15/2011   Procedure: CYSTOSCOPY WITH STENT REPLACEMENT;  Surgeon: Milford Cage, MD;  Location: [facility];  Service: Urology;  Laterality: Right;  right reter stent removal and stent placement   CYSTOSCOPY/RETROGRADE/URETEROSCOPY  12/15/2011   Procedure: CYSTOSCOPY/RETROGRADE/URETEROSCOPY;  Surgeon: Milford Cage, MD;  Location: [facility];  Service: Urology;  Laterality: Right;  no retrograde   LIVER BIOPSY  05/29/2002   MUSCLE BIOPSY Left 02/15/2023   Procedure: LEFT THIGH MUSCLE BIOPSY;  Surgeon: Fritzi Mandes, MD;  Location: [facility];  Service: General;  Laterality: Left;   ORCHIOPEXY  1970   TONSILLECTOMY  1970   UPPER GI ENDOSCOPY  03/17/2002     Home Medications:  Prior to Admission medications   Medication Sig Start Date End Date Taking? Authorizing Provider  albuterol (VENTOLIN HFA) 108 (90 Base) MCG/ACT inhaler Inhale 1-2 puffs into the lungs every 4 (four) hours as needed for wheezing or shortness of breath.   Yes [provider]  azelastine (ASTELIN) 137 MCG/SPRAY nasal spray Place 2 sprays into the nose 2 (two) times daily.   Yes [provider]  BEPREVE 1.5 % SOLN Place 1 drop into both eyes 2 (two) times daily as needed for allergies. 10/25/17  Yes [provider]  cetirizine (ZYRTEC) 10 MG tablet Take 10 mg by mouth at bedtime.    Yes [provider]  fluticasone (FLONASE) 50 MCG/ACT nasal spray Place 2 sprays into the nose in the morning.   Yes [provider]   furosemide (LASIX) 40 MG tablet Take 40 mg by mouth in the morning.   Yes [provider]  lansoprazole (PREVACID) 30 MG capsule Take 30 mg by mouth 2 (two) times daily before a meal.   Yes [provider]  montelukast (SINGULAIR) 10 MG tablet Take 10 mg by mouth at bedtime. 01/30/16  Yes [provider]  Multiple Vitamin (MULITIVITAMIN WITH MINERALS) TABS Take 1 tablet by mouth daily with breakfast.   Yes [provider]  ORENCIA 125 MG/ML SOSY Inject 125 mg into the skin every Sunday.   Yes [provider]  Potassium Citrate 15 MEQ (1620 MG) TBCR Take 15 mEq by mouth daily. 01/14/13  Yes [provider]  predniSONE (DELTASONE) 20 MG tablet Take 20 mg by mouth 3 (three) times daily with meals. 02/16/23 03/18/23 Yes [provider]  ramelteon (ROZEREM) 8 MG tablet Take 8 mg by mouth at bedtime as needed for sleep.   Yes [provider]  triamcinolone cream (KENALOG) 0.1 % Apply topically daily. Patient taking differently: Apply 1 Application topically daily as needed (for itching). 07/20/20  Yes Janalyn Harder, MD  AUVI-Q 0.3 MG/0.3ML SOAJ injection Inject 0.3 mg into the muscle as needed for anaphylaxis. 09/28/21   [provider]  furosemide (LASIX) 40 MG tablet Take 1 tablet (40 mg total) by mouth once for 1 dose. Follow up with your PCP or rheumatologist for repeat labs within 1 week Patient not taking: Reported on 02/19/2023 02/14/23 02/19/23  Zigmund Daniel., MD  sucralfate (CARAFATE) 1 g tablet Take 1 g by mouth 2 (two) times daily. Patient not taking: Reported on 02/19/2023 11/14/21   [provider]    Inpatient Medications: Scheduled Meds:  azelastine  2 spray Each Nare BID   fluticasone  2 spray Each Nare Daily   furosemide  20 mg Intravenous BID   lisinopril  2.5 mg Oral Daily   loratadine  10 mg Oral QHS   montelukast  10 mg Oral QHS   pantoprazole  20 mg Oral BID AC   potassium chloride  20 mEq  Oral Daily   predniSONE  20 mg Oral TID WC   Continuous Infusions:  PRN Meds: acetaminophen **OR** acetaminophen, ondansetron **OR** ondansetron (ZOFRAN) IV, ramelteon  Allergies:    Allergies  Allergen Reactions   Mushroom Extract Complex Shortness Of Breath and Other (See Comments)    All types mushrooms   Other Shortness Of Breath and Other (See Comments)    "ALL NUTS"   Peanut-Containing Drug Products Shortness Of Breath   Shellfish Allergy Hives, Shortness Of Breath and Other (See Comments)    All shellfish   Tape Rash and Other (See Comments)    The CLEAR, PLASTIC tape used in the hospital BREAKS OUT THE SKIN!!   Morphine Other (See Comments)     "makes my head feel like its stuffed; like it's going to pop"   Codeine Other (See Comments)     "makes my head feel like its stuffed; like it's going to pop"    Social History:   Social History   Socioeconomic History   Marital status: Single    Spouse name: Not on file   Number of children: 0   Years of education: Not on file   Highest education level: Not on file  Occupational History   Not on file  Tobacco Use   Smoking status: Former    Years: 4    Types: Cigarettes    Quit date: 1980    Years since quitting: 44.4   Smokeless tobacco: Never  Vaping Use   Vaping Use: Never used  Substance and Sexual Activity   Alcohol use: Not Currently   Drug use: Never   Sexual activity: Not Currently  Other Topics Concern   Not on file  Social History Narrative   Not on file   Social Determinants of Health   Financial Resource Strain: Not on file  Food Insecurity: No Food Insecurity (02/19/2023)   Hunger Vital Sign    Worried About Running Out of Food in the Last Year: Never true    Ran Out of Food in the Last Year: Never true  Transportation Needs: No Transportation Needs (02/19/2023)   PRAPARE - Administrator, Civil Service (Medical): No    Lack of Transportation (Non-Medical): No  Recent Concern:  Transportation Needs - Unmet Transportation Needs (02/12/2023)   PRAPARE - Transportation    Lack of  Transportation (Medical): Yes    Lack of Transportation (Non-Medical): Yes  Physical Activity: Not on file  Stress: Not on file  Social Connections: Not on file  Intimate Partner Violence: Not At Risk (02/19/2023)   Humiliation, Afraid, Rape, and Kick questionnaire    Fear of Current or Ex-Partner: No    Emotionally Abused: No    Physically Abused: No    Sexually Abused: No    Family History:    Family History  Problem Relation Age of Onset   Kidney Stones Mother    Arthritis Mother 68       Osteo   Heart disease Father 40   Heart attack Maternal Grandfather 27   Hyperlipidemia Brother      ROS:  Please see the history of present illness.   All other ROS reviewed and negative.     Physical Exam/Data:   Vitals:   02/19/23 2219 02/20/23 0135 02/20/23 0556 02/20/23 0600  BP: (!) 146/84 (!) 150/87  (!) 157/92  Pulse: (!) 47 (!) 51  (!) 52  Resp: 18 17  15   Temp: 98.2 F (36.8 C) 98.1 F (36.7 C)  98.3 F (36.8 C)  TempSrc: Oral Oral  Oral  SpO2: 98% 94%  100%  Weight:   69.5 kg   Height:        Intake/Output Summary (Last 24 hours) at 02/20/2023 0829 Last data filed at 02/20/2023 0600 Gross per 24 hour  Intake 600 ml  Output 1200 ml  Net -600 ml      02/20/2023    5:56 AM 02/19/2023   10:26 AM 02/15/2023    8:56 AM  Last 3 Weights  Weight (lbs) 153 lb 3.5 oz 158 lb 156 lb 4.8 oz  Weight (kg) 69.5 kg 71.668 kg 70.897 kg     Body mass index is 22.63 kg/m.  Patient evaluated while he was getting a limited echocardiogram. Physical exam limited so as to not interfere with echo. Full exam per MD  General:  Thin male, laying in the bed with head slightly elevated  HEENT: normal Neck: no JVD Lungs: Breathing unlabored  Ext: 2+ edema in BLE  Musculoskeletal:  No deformities Skin: warm and dry  Neuro:  CNs 2-12 intact, no focal abnormalities noted Psych:  Normal affect    EKG:  The EKG was personally reviewed and demonstrates: Sinus bradycardia with PACs, HR 54 BPM   Telemetry:  Telemetry was personally reviewed and demonstrates:  NSR, HR from 40s-60s   Relevant CV Studies: Cardiac Studies & Procedures     STRESS TESTS  NM MYOCAR MULTI W/SPECT W 02/27/2013   ECHOCARDIOGRAM  ECHOCARDIOGRAM COMPLETE 02/13/2023  Narrative ECHOCARDIOGRAM REPORT    Patient Name:   Stuart Collins Date of Exam: 02/13/2023 Medical Rec #:  [id number]    Height:       69.0 in Accession #:    [id number]   Weight:       163.1 lb Date of Birth:  1966/06/16     BSA:          1.895 m Patient Age:    56 years     BP:           120/75 mmHg Patient Gender: M            HR:           86 bpm. Exam Location:  Inpatient  Procedure: 2D Echo, Cardiac Doppler and Color Doppler  Indications:    Other abnormalities  of the heart.  History:        Patient has prior history of Echocardiogram examinations, most recent 12/02/2011.  Sonographer:    Lucy Antigua Referring Phys: [phone] A CALDWELL POWELL JR  IMPRESSIONS   1. Left ventricular ejection fraction, by estimation, is 65 to 70%. The left ventricle has normal function. The left ventricle has no regional wall motion abnormalities. Left ventricular diastolic parameters were normal. 2. Right ventricular systolic function is normal. The right ventricular size is mildly enlarged. 3. The mitral valve is normal in structure. Mild mitral valve regurgitation. No evidence of mitral stenosis. 4. The aortic valve is tricuspid. Aortic valve regurgitation is not visualized. No aortic stenosis is present. 5. The inferior vena cava is normal in size with greater than 50% respiratory variability, suggesting right atrial pressure of 3 mmHg.  Comparison(s): Function is more dynamic from 2013 report.  FINDINGS Left Ventricle: Left ventricular ejection fraction, by estimation, is 65 to 70%. The left ventricle has normal function. The left ventricle has  no regional wall motion abnormalities. The left ventricular internal cavity size was normal in size. There is no left ventricular hypertrophy. Left ventricular diastolic parameters were normal.  Right Ventricle: The right ventricular size is mildly enlarged. No increase in right ventricular wall thickness. Right ventricular systolic function is normal.  Left Atrium: Left atrial size was normal in size.  Right Atrium: Right atrial size was normal in size.  Pericardium: There is no evidence of pericardial effusion.  Mitral Valve: The mitral valve is normal in structure. Mild mitral valve regurgitation. No evidence of mitral valve stenosis.  Tricuspid Valve: The tricuspid valve is normal in structure. Tricuspid valve regurgitation is mild . No evidence of tricuspid stenosis.  Aortic Valve: The aortic valve is tricuspid. Aortic valve regurgitation is not visualized. No aortic stenosis is present. Aortic valve mean gradient measures 4.0 mmHg. Aortic valve peak gradient measures 7.5 mmHg. Aortic valve area, by VTI measures 2.80 cm.  Pulmonic Valve: The pulmonic valve was not well visualized. Pulmonic valve regurgitation is not visualized. No evidence of pulmonic stenosis.  Aorta: The aortic root and ascending aorta are structurally normal, with no evidence of dilitation.  Venous: The inferior vena cava is normal in size with greater than 50% respiratory variability, suggesting right atrial pressure of 3 mmHg.  IAS/Shunts: No atrial level shunt detected by color flow Doppler.   LEFT VENTRICLE PLAX 2D LVIDd:         5.20 cm     Diastology LVIDs:         3.10 cm     LV e' medial:    8.49 cm/s LV PW:         0.90 cm     LV E/e' medial:  11.8 LV IVS:        0.80 cm     LV e' lateral:   11.30 cm/s LVOT diam:     2.10 cm     LV E/e' lateral: 8.8 LV SV:         86 LV SV Index:   45 LVOT Area:     3.46 cm  LV Volumes (MOD) LV vol d, MOD A4C: 74.5 ml LV vol s, MOD A4C: 19.6 ml LV SV MOD  A4C:     74.5 ml  RIGHT VENTRICLE RV S prime:     11.00 cm/s TAPSE (M-mode): 3.0 cm  LEFT ATRIUM             Index  RIGHT ATRIUM           Index LA Vol (A2C):   49.8 ml 26.28 ml/m  RA Area:     13.30 cm LA Vol (A4C):   61.7 ml 32.57 ml/m  RA Volume:   30.50 ml  16.10 ml/m LA Biplane Vol: 57.8 ml 30.51 ml/m AORTIC VALVE AV Area (Vmax):    2.63 cm AV Area (Vmean):   2.66 cm AV Area (VTI):     2.80 cm AV Vmax:           137.00 cm/s AV Vmean:          94.900 cm/s AV VTI:            0.307 m AV Peak Grad:      7.5 mmHg AV Mean Grad:      4.0 mmHg LVOT Vmax:         104.00 cm/s LVOT Vmean:        72.900 cm/s LVOT VTI:          0.248 m LVOT/AV VTI ratio: 0.81  AORTA Ao Root diam: 3.20 cm Ao Asc diam:  2.80 cm  MITRAL VALVE                TRICUSPID VALVE MV Area (PHT): 5.97 cm     TR Peak grad:   26.4 mmHg MV Decel Time: 127 msec     TR Vmax:        257.00 cm/s MV E velocity: 100.00 cm/s MV A velocity: 66.40 cm/s   SHUNTS MV E/A ratio:  1.51         Systemic VTI:  0.25 m Systemic Diam: 2.10 cm  Riley Lam MD Electronically signed by Riley Lam MD Signature Date/Time: 02/13/2023/4:40:29 PM    Final     CT SCANS  CT CARDIAC SCORING (SELF PAY ONLY) 04/08/2014  Addendum 04/08/2014  9:28 AM ADDENDUM REPORT: 04/08/2014 09:25  CLINICAL DATA:  Risk stratification  EXAM: Coronary Calcium Score  TECHNIQUE: The patient was scanned on a Siemens Sensation 16 slice scanner. Axial non-contrast 3mm slices were carried out through the heart. The data set was analyzed on a dedicated work station and scored using the Agatson method.  FINDINGS: Non-cardiac: No significant non cardiac findings on limited lung and soft tissue windows. See separate report from [facility].  Ascending Aorta:  2.8 cm  Pericardium: Normal  Coronary arteries: Small amount of linear calcium in the proximal to mid LAD 65.5  IMPRESSION: Coronary calcium  score of 65.5 . This was 75th percentile for age and sex matched control.  Charlton Haws   Electronically Signed By: Charlton Haws M.D. On: 04/08/2014 09:25  Narrative EXAM: OVER-READ INTERPRETATION  CT CHEST  The following report is an over-read performed by radiologist Dr. Arliss Journey [facility] on 03/31/2014. This over-read does not include interpretation of cardiac or coronary anatomy or pathology. The coronary calcium score/coronary CTA interpretation by the cardiologist is attached.  COMPARISON:  Chest radiograph of 04/15/2013.  No prior CT.  FINDINGS: Lung windows demonstrate no nodules or airspace opacities.  Soft tissue windows demonstrate no pleural fluid. No image thoracic adenopathy.  Limited abdominal imaging demonstrates suspicion of moderate hepatic steatosis, incompletely imaged.  No acute osseous abnormality.  IMPRESSION: 1. No significant extracardiac findings within the imaged chest. 2. Incompletely imaged hepatic steatosis.  Electronically Signed: By: Jeronimo Greaves M.D. On: 03/31/2014 10:58           Laboratory Data:  High Sensitivity Troponin:   Recent Labs  Lab 02/19/23 1052 02/19/23 1236  TROPONINIHS 67* 48*     Chemistry Recent Labs  Lab 02/14/23 0409 02/15/23 1152 02/19/23 1052 02/20/23 0426  NA 125* 131* 133* 131*  K 3.9 4.1 3.9 4.2  CL 99 99 106 106  CO2 20*  --  20* 19*  GLUCOSE 79 77 110* 113*  BUN 28* 23* 43* 39*  CREATININE 0.98 0.90 0.97 0.87  CALCIUM 8.0*  --  8.8* 8.6*  MG 1.7  --   --   --   GFRNONAA >60  --  >60 >60  ANIONGAP 6  --  7 6    Recent Labs  Lab 02/14/23 0403 02/19/23 1052  PROT  --  8.1  ALBUMIN 1.8* 2.7*  AST  --  151*  ALT  --  158*  ALKPHOS  --  80  BILITOT  --  0.4   Lipids No results for input(s): "CHOL", "TRIG", "HDL", "LABVLDL", "LDLCALC", "CHOLHDL" in the last 168 hours.  Hematology Recent Labs  Lab 02/14/23 0409 02/15/23 1152 02/19/23 1052 02/20/23 0426  WBC  3.4*  --  10.6* 9.1  RBC 3.06*  --  2.98* 2.75*  HGB 9.5* 9.2* 8.9* 8.4*  HCT 27.1* 27.0* 27.9* 25.5*  MCV 88.6  --  93.6 92.7  MCH 31.0  --  29.9 30.5  MCHC 35.1  --  31.9 32.9  RDW 14.5  --  16.1* 16.1*  PLT 192  --  253 199   Thyroid  Recent Labs  Lab 02/14/23 0403 02/19/23 2034  TSH  --  5.595*  FREET4 0.93  --     BNP Recent Labs  Lab 02/19/23 1052  BNP 700.8*    DDimer No results for input(s): "DDIMER" in the last 168 hours.   Radiology/Studies:  CT Angio Chest PE W and/or Wo Contrast  Result Date: 02/19/2023 CLINICAL DATA:  Pulmonary embolism (PE) suspected, high prob EXAM: CT ANGIOGRAPHY CHEST WITH CONTRAST TECHNIQUE: Multidetector CT imaging of the chest was performed using the standard protocol during bolus administration of intravenous contrast. Multiplanar CT image reconstructions and MIPs were obtained to evaluate the vascular anatomy. RADIATION DOSE REDUCTION: This exam was performed according to the departmental dose-optimization program which includes automated exposure control, adjustment of the mA and/or kV according to patient size and/or use of iterative reconstruction technique. CONTRAST:  75mL OMNIPAQUE IOHEXOL 350 MG/ML SOLN COMPARISON:  03/31/2014 FINDINGS: Cardiovascular: No filling defects in the pulmonary arteries to suggest pulmonary emboli. Heart is normal size. Aorta is normal caliber. Coronary artery calcifications in the left coronary arteries. Mediastinum/Nodes: No mediastinal, hilar, or axillary adenopathy. Trachea and esophagus are unremarkable. Thyroid unremarkable. Lungs/Pleura: Small bilateral pleural effusions. Bibasilar airspace opacities could reflect atelectasis or infiltrates. Upper Abdomen: No acute findings 11 mm gallstone within the gallbladder. Musculoskeletal: Chest wall soft tissues are unremarkable. No acute bony abnormality. Review of the MIP images confirms the above findings. IMPRESSION: No evidence of pulmonary embolus. Small  bilateral pleural effusions. Bibasilar atelectasis or infiltrates. Coronary artery disease. Cholelithiasis. Electronically Signed   By: Charlett Nose M.D.   On: 02/19/2023 13:33     Assessment and Plan:   Small Bilateral Pleural Effusions  Volume Overload  - Echocardiogram from 02/13/2023 showed EF 65-70%, normal LV diastolic parameters, normal RV systolic function - Patient now presents complaining of shortness of breath.  BNP elevated to 700.8.  CTA chest with small bilateral pleural effusions -Patient now on IV Lasix.  He  was given 1 dose IV Lasix 40 mg yesterday and output 1.2 L urine.  - Continue IV lasix 40 mg BID  - Follow Strict I/Os, daily weights. Daily BMPs to follow renal function, electrolytes  - Of note, patient has moderate protein malnutrition. Albumin recently down to 1.8 on 5/30, up to 2.7 on admission. This is likely contributing to his volume status  - Limited echocardiogram pending today- I am hesitant to label this as diastolic heart failure when it is more likely that his hypervolemia is due to low albumin/protein malnutrition, polymyositis.   Sinus bradycardia  - Per telemetry, patient has had intermittently low HR into the 40s. He has been asymptomatic  - BP has been elevated this admission- as high as 163/80  - Avoid av nodal blocking medications.  - THS is elevated to 5.56. Ordered free T4   Elevated Troponin  - hsTn 67>48 - Patient denies chest pain  - Suspect trop elevation is demand ischemia in the setting of volume overload.  - Limited echo pending   HTN  - Follow BP after receiving AM medications  HLD - followed as an outpatient by Dr. 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

## 2023-02-20 NOTE — Plan of Care (Signed)
  Problem: Education: Goal: Ability to demonstrate management of disease process will improve Outcome: Not Progressing Goal: Ability to verbalize understanding of medication therapies will improve Outcome: Not Progressing   Problem: Education: Goal: Ability to verbalize understanding of medication therapies will improve Outcome: Not Progressing   

## 2023-02-20 NOTE — Progress Notes (Signed)
2D echo attempted, patient eating. Will try later 

## 2023-02-20 NOTE — Progress Notes (Signed)
[facility]  PROGRESS NOTE  Stuart Collins XBJ:[id number] DOB: 1966-04-06 DOA: 02/19/2023 PCP: Ralene Ok, MD   Brief HPI:   57 y.o. male with medical history significant of allergic rhinitis, mild intermittent asthma, CAD, transaminitis, NASH, GERD, basal cell carcinoma, nephrolithiasis, hyperlipidemia, rheumatoid arthritis, Raynaud's disease, right sciatica who presented to the emergency department with complaints of progressively worse dyspnea since yesterday.  He also has a nonproductive cough, lower extremity edema and orthopnea.  [facility] and was advised to come to the emergency department.   BNP 700.8 pg/mL.    Imaging: CTA chest with no evidence of PE.  There is small bilateral pleural effusions.   Assessment/Plan:    Volume overload -Started on IV Lasix; BNP was elevated 700 -Cardiology consulted; do not feel this is from CHF exacerbation -Likely from hypoalbuminemia and active inflammatory condition -Also has pulmonary hypertension -Patient was recently started on prednisone for polymyositis -Cardiology plan to repeat echo in the next few days -Continue IV Lasix 20 mg twice daily  Troponin elevation -Troponin 67, 48 -Likely in setting of demand ischemia  Sinus bradycardia -Asymptomatic -Avoid AV nodal blocking agents  Asthma -continue albuterol as needed  Hyperlipidemia -Not on statin at this time  Allergic rhinitis -Continue Flonase as needed  Transaminitis -Chronic -Has seen GI in the past -LFTs are improving  Myositis -Recently started on prednisone 20 mg p.o. 3 times daily -Follow-up rheumatology as outpatient  Normocytic anemia -Monitor hemoglobin and hematocrit  Moderate protein calorie malnutrition -Protein supplementation -Dietitian consulted    Medications     azelastine  2 spray Each Nare BID   fluticasone  2 spray Each Nare Daily   furosemide  20 mg Intravenous BID   lisinopril  2.5 mg Oral Daily    loratadine  10 mg Oral QHS   montelukast  10 mg Oral QHS   pantoprazole  20 mg Oral BID AC   potassium chloride  20 mEq Oral Daily   predniSONE  20 mg Oral TID WC     Data Reviewed:   CBG:  No results for input(s): "GLUCAP" in the last 168 hours.  SpO2: 100 %    Vitals:   02/19/23 2219 02/20/23 0135 02/20/23 0556 02/20/23 0600  BP: (!) 146/84 (!) 150/87  (!) 157/92  Pulse: (!) 47 (!) 51  (!) 52  Resp: 18 17  15   Temp: 98.2 F (36.8 C) 98.1 F (36.7 C)  98.3 F (36.8 C)  TempSrc: Oral Oral  Oral  SpO2: 98% 94%  100%  Weight:   69.5 kg   Height:          Data Reviewed:  Basic Metabolic Panel: Recent Labs  Lab 02/13/23 1633 02/14/23 0409 02/15/23 1152 02/19/23 1052 02/20/23 0426  NA 124* 125* 131* 133* 131*  K 4.7 3.9 4.1 3.9 4.2  CL 97* 99 99 106 106  CO2 22 20*  --  20* 19*  GLUCOSE 82 79 77 110* 113*  BUN 26* 28* 23* 43* 39*  CREATININE 0.96 0.98 0.90 0.97 0.87  CALCIUM 8.4* 8.0*  --  8.8* 8.6*  MG  --  1.7  --   --   --   PHOS  --  5.2*  --   --   --     CBC: Recent Labs  Lab 02/14/23 0409 02/15/23 1152 02/19/23 1052 02/20/23 0426  WBC 3.4*  --  10.6* 9.1  NEUTROABS  --   --  6.3  --  HGB 9.5* 9.2* 8.9* 8.4*  HCT 27.1* 27.0* 27.9* 25.5*  MCV 88.6  --  93.6 92.7  PLT 192  --  253 199    LFT Recent Labs  Lab 02/14/23 0403 02/19/23 1052  AST  --  151*  ALT  --  158*  ALKPHOS  --  80  BILITOT  --  0.4  PROT  --  8.1  ALBUMIN 1.8* 2.7*     Antibiotics: Anti-infectives (From admission, onward)    None        DVT prophylaxis: Lovenox  Code Status: Full code  Family Communication: Discussed with family at bedside   CONSULTS cardiology   Subjective   Patient seen, denies shortness of breath.   Objective    Physical Examination:  General-appears in no acute distress Heart-S1-S2, regular, no murmur auscultated Lungs-clear to auscultation bilaterally, no wheezing or crackles auscultated Abdomen-soft, nontender,  no organomegaly Extremities-no edema in the lower extremities Neuro-alert, oriented x3, no focal deficit noted  Status is: Inpatient:             Meredeth Ide   [facility] If 7PM-7AM, please contact night-coverage at [email or web address], Office  [phone]   02/20/2023, 8:24 AM  LOS: 0 days

## 2023-02-21 DIAGNOSIS — E871 Hypo-osmolality and hyponatremia: Secondary | ICD-10-CM | POA: Diagnosis not present

## 2023-02-21 DIAGNOSIS — I272 Pulmonary hypertension, unspecified: Secondary | ICD-10-CM

## 2023-02-21 DIAGNOSIS — R601 Generalized edema: Secondary | ICD-10-CM | POA: Diagnosis not present

## 2023-02-21 DIAGNOSIS — E8770 Fluid overload, unspecified: Secondary | ICD-10-CM | POA: Diagnosis not present

## 2023-02-21 LAB — CBC
HCT: 26 % — ABNORMAL LOW (ref 39.0–52.0)
Hemoglobin: 8.5 g/dL — ABNORMAL LOW (ref 13.0–17.0)
MCH: 30.5 pg (ref 26.0–34.0)
MCHC: 32.7 g/dL (ref 30.0–36.0)
MCV: 93.2 fL (ref 80.0–100.0)
Platelets: 193 10*3/uL (ref 150–400)
RBC: 2.79 MIL/uL — ABNORMAL LOW (ref 4.22–5.81)
RDW: 16.5 % — ABNORMAL HIGH (ref 11.5–15.5)
WBC: 8.3 10*3/uL (ref 4.0–10.5)
nRBC: 0.4 % — ABNORMAL HIGH (ref 0.0–0.2)

## 2023-02-21 LAB — COMPREHENSIVE METABOLIC PANEL
ALT: 123 U/L — ABNORMAL HIGH (ref 0–44)
AST: 87 U/L — ABNORMAL HIGH (ref 15–41)
Albumin: 2.5 g/dL — ABNORMAL LOW (ref 3.5–5.0)
Alkaline Phosphatase: 60 U/L (ref 38–126)
Anion gap: 7 (ref 5–15)
BUN: 40 mg/dL — ABNORMAL HIGH (ref 6–20)
CO2: 20 mmol/L — ABNORMAL LOW (ref 22–32)
Calcium: 8.4 mg/dL — ABNORMAL LOW (ref 8.9–10.3)
Chloride: 103 mmol/L (ref 98–111)
Creatinine, Ser: 0.97 mg/dL (ref 0.61–1.24)
GFR, Estimated: >60 mL/min (ref >60)
Glucose, Bld: 120 mg/dL — ABNORMAL HIGH (ref 70–99)
Potassium: 3.7 mmol/L (ref 3.5–5.1)
Sodium: 130 mmol/L — ABNORMAL LOW (ref 135–145)
Total Bilirubin: 0.4 mg/dL (ref 0.3–1.2)
Total Protein: 7.4 g/dL (ref 6.5–8.1)

## 2023-02-21 MED ORDER — ENSURE ENLIVE PO LIQD: 237.0000 mL | Freq: Two times a day (BID) | ORAL | Status: DC

## 2023-02-21 MED ORDER — FUROSEMIDE 40 MG PO TABS: 40.0000 mg | ORAL_TABLET | Freq: Every morning | ORAL | 2 refills | Status: DC

## 2023-02-21 MED ORDER — SPIRONOLACTONE 25 MG PO TABS: 12.5000 mg | ORAL_TABLET | Freq: Every day | ORAL | 3 refills | Status: DC

## 2023-02-21 NOTE — Discharge Summary (Addendum)
Physician Discharge Summary   Patient: Stuart Collins MRN: [id number] DOB: [birth year]  Admit date:     02/19/2023  Discharge date: 02/21/23  Discharge Physician: Meredeth Ide   PCP: Ralene Ok, MD   Recommendations at discharge:   Follow-up PCP in 1 week Follow-up with cardiology on 02/28/2023 at 2:45 PM  Discharge Diagnoses: Principal Problem:   Volume overload Active Problems:   Asthma   Hyperlipidemia   Allergic rhinitis   Hyponatremia   Transaminitis   Normocytic anemia   Elevated troponin   Sinus bradycardia   Moderate protein malnutrition (HCC)   Acute on chronic diastolic congestive heart failure (HCC)   Myositis   Anasarca  Resolved Problems:   * No resolved hospital problems. *  Hospital Course:  57 y.o. male with medical history significant of allergic rhinitis, mild intermittent asthma, CAD, transaminitis, NASH, GERD, basal cell carcinoma, nephrolithiasis, hyperlipidemia, rheumatoid arthritis, Raynaud's disease, right sciatica who presented to the emergency department with complaints of progressively worse dyspnea since yesterday.  He also has a nonproductive cough, lower extremity edema and orthopnea.  [facility] and was advised to come to the emergency department.   BNP 700.8 pg/mL.    Imaging: CTA chest with no evidence of PE.  There is small bilateral pleural effusions.  Assessment and Plan:  Volume overload -Started on IV Lasix; BNP was elevated 700 -Cardiology consulted; do not feel this is from CHF exacerbation -Likely from hypoalbuminemia and active inflammatory condition -Also has pulmonary hypertension -Patient was recently started on prednisone for polymyositis -Cardiology plan to repeat echo as outpatient -Will discharge on Lasix 40 mg p.o. twice daily for 3 days then start taking 40 mg daily -Started on 112.5 mg p.o. daily -Follow-up cardiology as outpatient   Troponin elevation -Troponin 67, 48 -Likely in setting of  demand ischemia   Sinus bradycardia -Asymptomatic -Avoid AV nodal blocking agents   Asthma -continue albuterol as needed   Hyperlipidemia -Not on statin at this time  Hyponatremia -Sodium is 130 -Likely in setting of hypervolemic hyponatremia -Has been chronically low since last 1 month -Follow-up PCP, check BMP as outpatient after diuresis   Allergic rhinitis -Continue Flonase as needed   Transaminitis -Chronic -Has seen GI in the past -LFTs are improving   Myositis -Recently started on prednisone 20 mg p.o. 3 times daily -Follow-up rheumatology as outpatient   Normocytic anemia -Monitor hemoglobin and hematocrit   Moderate protein calorie malnutrition -Protein supplementation         Consultants: Cardiology Procedures performed: Echocardiogram Disposition: Home Diet recommendation:  Discharge Diet Orders (From admission, onward)     Start     Ordered   02/21/23 0000  Diet - low sodium heart healthy        02/21/23 1155           Regular diet DISCHARGE MEDICATION: Allergies as of 02/21/2023       Reactions   Mushroom Extract Complex Shortness Of Breath, Other (See Comments)   All types mushrooms   Other Shortness Of Breath, Other (See Comments)   "ALL NUTS"   Peanut-containing Drug Products Shortness Of Breath   Shellfish Allergy Hives, Shortness Of Breath, Other (See Comments)   All shellfish   Tape Rash, Other (See Comments)   The CLEAR, PLASTIC tape used in the hospital BREAKS OUT THE SKIN!!   Morphine Other (See Comments)    "makes my head feel like its stuffed; like it's going to pop"  Codeine Other (See Comments)    "makes my head feel like its stuffed; like it's going to pop"        Medication List     TAKE these medications    albuterol 108 (90 Base) MCG/ACT inhaler Commonly known as: VENTOLIN HFA Inhale 1-2 puffs into the lungs every 4 (four) hours as needed for wheezing or shortness of breath.   Auvi-Q 0.3 mg/0.3 mL  Soaj injection Generic drug: EPINEPHrine Inject 0.3 mg into the muscle as needed for anaphylaxis.   azelastine 0.1 % nasal spray Commonly known as: ASTELIN Place 2 sprays into the nose 2 (two) times daily.   Bepreve 1.5 % Soln Generic drug: Bepotastine Besilate Place 1 drop into both eyes 2 (two) times daily as needed for allergies.   cetirizine 10 MG tablet Commonly known as: ZYRTEC Take 10 mg by mouth at bedtime.   fluticasone 50 MCG/ACT nasal spray Commonly known as: FLONASE Place 2 sprays into the nose in the morning.   furosemide 40 MG tablet Commonly known as: LASIX Take 1 tablet (40 mg total) by mouth in the morning. Take Lasix 40 mg po twice daily for 3 days then start taking 40 mg once daily from 02/24/23 What changed:  additional instructions Another medication with the same name was removed. Continue taking this medication, and follow the directions you see here.   lansoprazole 30 MG capsule Commonly known as: PREVACID Take 30 mg by mouth 2 (two) times daily before a meal.   montelukast 10 MG tablet Commonly known as: SINGULAIR Take 10 mg by mouth at bedtime.   multivitamin with minerals Tabs tablet Take 1 tablet by mouth daily with breakfast.   Orencia 125 MG/ML Sosy Generic drug: Abatacept Inject 125 mg into the skin every Sunday.   Potassium Citrate 15 MEQ (1620 MG) Tbcr Take 15 mEq by mouth daily.   predniSONE 20 MG tablet Commonly known as: DELTASONE Take 20 mg by mouth 3 (three) times daily with meals.   ramelteon 8 MG tablet Commonly known as: ROZEREM Take 8 mg by mouth at bedtime as needed for sleep.   spironolactone 25 MG tablet Commonly known as: ALDACTONE Take 0.5 tablets (12.5 mg total) by mouth daily. Start taking on: February 22, 2023   sucralfate 1 g tablet Commonly known as: CARAFATE Take 1 g by mouth 2 (two) times daily.   triamcinolone cream 0.1 % Commonly known as: KENALOG Apply topically daily. What changed:  how much to  take when to take this reasons to take this        Follow-up Information     Monge, Petra Kuba, NP Follow up.   Specialties: Cardiology, Family Medicine Why: Hospital follow-up with Cardiology scheduled for 02/28/2023 at 2:45pm. Please arrive 15 minutes early for check-in. If this date/time does not work for you, please call our office to reschedule. Contact information: [address] [place] [address] [phone]                Discharge Exam: Ceasar Mons Weights   02/19/23 1026 02/20/23 0556 02/21/23 0511  Weight: 71.7 kg 69.5 kg 67.1 kg   General-appears in no acute distress Heart-S1-S2, regular, no murmur auscultated Lungs-clear to auscultation bilaterally, no wheezing or crackles auscultated Abdomen-soft, nontender, no organomegaly Extremities-no edema in the lower extremities Neuro-alert, oriented x3, no focal deficit noted  Condition at discharge: good  The results of significant diagnostics from this hospitalization (including imaging, microbiology, ancillary and laboratory) are listed below for  reference.   Imaging Studies: ECHOCARDIOGRAM LIMITED  Result Date: 02/20/2023    ECHOCARDIOGRAM LIMITED REPORT   Patient Name:   Stuart Collins Date of Exam: 02/20/2023 Medical Rec #:  [id number]    Height:       69.0 in Accession #:    [id number]   Weight:       153.2 lb Date of Birth:  03-Nov-1965     BSA:          1.845 m Patient Age:    57 years     BP:           157/92 mmHg Patient Gender: M            HR:           58 bpm. Exam Location:  Inpatient Procedure: 3D Echo, Limited Echo, Cardiac Doppler and Color Doppler Indications:    I50.40* Unspecified combined systolic (congestive) and diastolic                 (congestive) heart failure  History:        Patient has prior history of Echocardiogram examinations, most                 recent 02/13/2023. CHF, CAD, Abnormal ECG,                 Arrythmias:Bradycardia, Signs/Symptoms:Dyspnea and Shortness of                  Breath; Risk Factors:Dyslipidemia.  Sonographer:    Sheralyn Boatman RDCS Referring Phys: [id number] Ronnald Ramp O'NEAL IMPRESSIONS  1. Left ventricular ejection fraction, by estimation, is 65 to 70%. Left ventricular ejection fraction by 3D volume is 68 %. The left ventricle has normal function. The left ventricle has no regional wall motion abnormalities.  2. Right ventricular systolic function is normal. The right ventricular size is mildly enlarged. A Prominent moderator band is visualized. There is moderately elevated pulmonary artery systolic pressure. The estimated right ventricular systolic pressure  is 59.4 mmHg.  3. The mitral valve is normal in structure. Mild mitral valve regurgitation. No evidence of mitral stenosis.  4. The aortic valve is tricuspid. Aortic valve regurgitation is not visualized. Aortic valve sclerosis/calcification is present, without any evidence of aortic stenosis.  5. The inferior vena cava is dilated in size with <50% respiratory variability, suggesting right atrial pressure of 15 mmHg. FINDINGS  Left Ventricle: Left ventricular ejection fraction, by estimation, is 65 to 70%. Left ventricular ejection fraction by 3D volume is 68 %. The left ventricle has normal function. The left ventricle has no regional wall motion abnormalities. The left ventricular internal cavity size was normal in size. There is no left ventricular hypertrophy. Right Ventricle: The right ventricular size is mildly enlarged. No increase in right ventricular wall thickness. Right ventricular systolic function is normal. There is moderately elevated pulmonary artery systolic pressure. The tricuspid regurgitant velocity is 3.33 m/s, and with an assumed right atrial pressure of 15 mmHg, the estimated right ventricular systolic pressure is 59.4 mmHg. Left Atrium: Left atrial size was normal in size. Right Atrium: Right atrial size was normal in size. Pericardium: There is no evidence of pericardial effusion. Mitral Valve:  The mitral valve is normal in structure. Mild mitral valve regurgitation. No evidence of mitral valve stenosis. Tricuspid Valve: The tricuspid valve is normal in structure. Tricuspid valve regurgitation is mild . No evidence of tricuspid stenosis. Aortic Valve: The aortic valve is tricuspid. Aortic valve  regurgitation is not visualized. Aortic valve sclerosis/calcification is present, without any evidence of aortic stenosis. Pulmonic Valve: The pulmonic valve was normal in structure. Pulmonic valve regurgitation is trivial. No evidence of pulmonic stenosis. Aorta: The aortic root is normal in size and structure. Venous: The inferior vena cava is dilated in size with less than 50% respiratory variability, suggesting right atrial pressure of 15 mmHg. IAS/Shunts: No atrial level shunt detected by color flow Doppler. Additional Comments: A Prominent moderator band is visualized. Spectral Doppler performed. Color Doppler performed.  LEFT VENTRICLE PLAX 2D LVIDd:         5.10 cm LVIDs:         2.80 cm LV PW:         0.90 cm         3D Volume EF LV IVS:        0.90 cm         LV 3D EF:    Left LVOT diam:     2.30 cm                      ventricul LVOT Area:     4.15 cm                     ar                                             ejection                                             fraction LV Volumes (MOD)                            by 3D LV vol d, MOD    98.7 ml                    volume is A2C:                                        68 %. LV vol d, MOD    74.2 ml A4C: LV vol s, MOD    24.0 ml       3D Volume EF: A2C:                           3D EF:        68 % LV vol s, MOD    24.8 ml A4C: LV SV MOD A2C:   74.7 ml LV SV MOD A4C:   74.2 ml LV SV MOD BP:    64.0 ml IVC IVC diam: 2.60 cm LEFT ATRIUM         Index LA diam:    4.60 cm 2.49 cm/m                        PULMONIC VALVE AORTA                 PR End Diast Vel: 1.79 msec Ao Root diam: 3.40 cm Ao Asc  diam:  2.80 cm MITRAL VALVE               TRICUSPID VALVE  MV Area (PHT): 3.72 cm    TR Peak grad:   44.4 mmHg MV Decel Time: 204 msec    TR Vmax:        333.00 cm/s MV E velocity: 93.35 cm/s MV A velocity: 48.95 cm/s  SHUNTS MV E/A ratio:  1.91        Systemic Diam: 2.30 cm Armanda Magic MD Electronically signed by Armanda Magic MD Signature Date/Time: 02/20/2023/9:27:11 AM    Final    CT Angio Chest PE W and/or Wo Contrast  Result Date: 02/19/2023 CLINICAL DATA:  Pulmonary embolism (PE) suspected, high prob EXAM: CT ANGIOGRAPHY CHEST WITH CONTRAST TECHNIQUE: Multidetector CT imaging of the chest was performed using the standard protocol during bolus administration of intravenous contrast. Multiplanar CT image reconstructions and MIPs were obtained to evaluate the vascular anatomy. RADIATION DOSE REDUCTION: This exam was performed according to the departmental dose-optimization program which includes automated exposure control, adjustment of the mA and/or kV according to patient size and/or use of iterative reconstruction technique. CONTRAST:  75mL OMNIPAQUE IOHEXOL 350 MG/ML SOLN COMPARISON:  03/31/2014 FINDINGS: Cardiovascular: No filling defects in the pulmonary arteries to suggest pulmonary emboli. Heart is normal size. Aorta is normal caliber. Coronary artery calcifications in the left coronary arteries. Mediastinum/Nodes: No mediastinal, hilar, or axillary adenopathy. Trachea and esophagus are unremarkable. Thyroid unremarkable. Lungs/Pleura: Small bilateral pleural effusions. Bibasilar airspace opacities could reflect atelectasis or infiltrates. Upper Abdomen: No acute findings 11 mm gallstone within the gallbladder. Musculoskeletal: Chest wall soft tissues are unremarkable. No acute bony abnormality. Review of the MIP images confirms the above findings. IMPRESSION: No evidence of pulmonary embolus. Small bilateral pleural effusions. Bibasilar atelectasis or infiltrates. Coronary artery disease. Cholelithiasis. Electronically Signed   By: Charlett Nose M.D.   On:  02/19/2023 13:33   ECHOCARDIOGRAM COMPLETE  Result Date: 02/13/2023    ECHOCARDIOGRAM REPORT   Patient Name:   Stuart Collins Date of Exam: 02/13/2023 Medical Rec #:  [id number]    Height:       69.0 in Accession #:    [id number]   Weight:       163.1 lb Date of Birth:  1966-06-01     BSA:          1.895 m Patient Age:    56 years     BP:           120/75 mmHg Patient Gender: M            HR:           86 bpm. Exam Location:  Inpatient Procedure: 2D Echo, Cardiac Doppler and Color Doppler Indications:    Other abnormalities of the heart.  History:        Patient has prior history of Echocardiogram examinations, most                 recent 12/02/2011.  Sonographer:    Lucy Antigua Referring Phys: [phone] A CALDWELL POWELL JR IMPRESSIONS  1. Left ventricular ejection fraction, by estimation, is 65 to 70%. The left ventricle has normal function. The left ventricle has no regional wall motion abnormalities. Left ventricular diastolic parameters were normal.  2. Right ventricular systolic function is normal. The right ventricular size is mildly enlarged.  3. The mitral valve is normal in structure. Mild mitral valve regurgitation. No evidence of mitral stenosis.  4.  The aortic valve is tricuspid. Aortic valve regurgitation is not visualized. No aortic stenosis is present.  5. The inferior vena cava is normal in size with greater than 50% respiratory variability, suggesting right atrial pressure of 3 mmHg. Comparison(s): Function is more dynamic from 2013 report. FINDINGS  Left Ventricle: Left ventricular ejection fraction, by estimation, is 65 to 70%. The left ventricle has normal function. The left ventricle has no regional wall motion abnormalities. The left ventricular internal cavity size was normal in size. There is  no left ventricular hypertrophy. Left ventricular diastolic parameters were normal. Right Ventricle: The right ventricular size is mildly enlarged. No increase in right ventricular wall thickness. Right  ventricular systolic function is normal. Left Atrium: Left atrial size was normal in size. Right Atrium: Right atrial size was normal in size. Pericardium: There is no evidence of pericardial effusion. Mitral Valve: The mitral valve is normal in structure. Mild mitral valve regurgitation. No evidence of mitral valve stenosis. Tricuspid Valve: The tricuspid valve is normal in structure. Tricuspid valve regurgitation is mild . No evidence of tricuspid stenosis. Aortic Valve: The aortic valve is tricuspid. Aortic valve regurgitation is not visualized. No aortic stenosis is present. Aortic valve mean gradient measures 4.0 mmHg. Aortic valve peak gradient measures 7.5 mmHg. Aortic valve area, by VTI measures 2.80 cm. Pulmonic Valve: The pulmonic valve was not well visualized. Pulmonic valve regurgitation is not visualized. No evidence of pulmonic stenosis. Aorta: The aortic root and ascending aorta are structurally normal, with no evidence of dilitation. Venous: The inferior vena cava is normal in size with greater than 50% respiratory variability, suggesting right atrial pressure of 3 mmHg. IAS/Shunts: No atrial level shunt detected by color flow Doppler.  LEFT VENTRICLE PLAX 2D LVIDd:         5.20 cm     Diastology LVIDs:         3.10 cm     LV e' medial:    8.49 cm/s LV PW:         0.90 cm     LV E/e' medial:  11.8 LV IVS:        0.80 cm     LV e' lateral:   11.30 cm/s LVOT diam:     2.10 cm     LV E/e' lateral: 8.8 LV SV:         86 LV SV Index:   45 LVOT Area:     3.46 cm  LV Volumes (MOD) LV vol d, MOD A4C: 74.5 ml LV vol s, MOD A4C: 19.6 ml LV SV MOD A4C:     74.5 ml RIGHT VENTRICLE RV S prime:     11.00 cm/s TAPSE (M-mode): 3.0 cm LEFT ATRIUM             Index        RIGHT ATRIUM           Index LA Vol (A2C):   49.8 ml 26.28 ml/m  RA Area:     13.30 cm LA Vol (A4C):   61.7 ml 32.57 ml/m  RA Volume:   30.50 ml  16.10 ml/m LA Biplane Vol: 57.8 ml 30.51 ml/m  AORTIC VALVE AV Area (Vmax):    2.63 cm AV Area  (Vmean):   2.66 cm AV Area (VTI):     2.80 cm AV Vmax:           137.00 cm/s AV Vmean:          94.900 cm/s AV  VTI:            0.307 m AV Peak Grad:      7.5 mmHg AV Mean Grad:      4.0 mmHg LVOT Vmax:         104.00 cm/s LVOT Vmean:        72.900 cm/s LVOT VTI:          0.248 m LVOT/AV VTI ratio: 0.81  AORTA Ao Root diam: 3.20 cm Ao Asc diam:  2.80 cm MITRAL VALVE                TRICUSPID VALVE MV Area (PHT): 5.97 cm     TR Peak grad:   26.4 mmHg MV Decel Time: 127 msec     TR Vmax:        257.00 cm/s MV E velocity: 100.00 cm/s MV A velocity: 66.40 cm/s   SHUNTS MV E/A ratio:  1.51         Systemic VTI:  0.25 m                             Systemic Diam: 2.10 cm Riley Lam MD Electronically signed by Riley Lam MD Signature Date/Time: 02/13/2023/4:40:29 PM    Final    CT Head Wo Contrast  Result Date: 02/12/2023 CLINICAL DATA:  [name] fall. Hit head on the concrete floor with hematoma and abrasion of the back of the head. EXAM: CT HEAD WITHOUT CONTRAST TECHNIQUE: Contiguous axial images were obtained from the base of the skull through the vertex without intravenous contrast. RADIATION DOSE REDUCTION: This exam was performed according to the departmental dose-optimization program which includes automated exposure control, adjustment of the mA and/or kV according to patient size and/or use of iterative reconstruction technique. COMPARISON:  None Available. FINDINGS: Brain: No evidence of acute infarction, hemorrhage, hydrocephalus, extra-axial collection or mass lesion/mass effect. Vascular: No hyperdense vessel or unexpected calcification. Skull: Right parietal scalp hematoma. No calvarial fracture focal bone lesion. Sinuses/Orbits: No acute finding. Other: None. IMPRESSION: 1. No acute intracranial abnormality. 2. Right parietal scalp hematoma. No calvarial fracture. Electronically Signed   By: Larose Hires D.O.   On: 02/12/2023 09:58   CT ABDOMEN PELVIS W CONTRAST  Result Date:  01/28/2023 CLINICAL DATA:  Left lower quadrant abdominal pain EXAM: CT ABDOMEN AND PELVIS WITH CONTRAST TECHNIQUE: Multidetector CT imaging of the abdomen and pelvis was performed using the standard protocol following bolus administration of intravenous contrast. RADIATION DOSE REDUCTION: This exam was performed according to the departmental dose-optimization program which includes automated exposure control, adjustment of the mA and/or kV according to patient size and/or use of iterative reconstruction technique. CONTRAST:  85mL OMNIPAQUE IOHEXOL 300 MG/ML  SOLN COMPARISON:  07/25/2022 FINDINGS: Lower chest:  No contributory findings. Hepatobiliary: Similar large caudate lobe and fissures although no surface lobulation. Cholelithiasis.No evidence of biliary inflammation Pancreas: Unremarkable. Spleen: Unremarkable. Adrenals/Urinary Tract: Negative adrenals. No hydronephrosis or ureteral stone. Numerous left renal calculi with early branching stone at the upper pole measuring up to 1 cm in length. Punctate right lower pole calculus. Unremarkable bladder. Stomach/Bowel:  No obstruction. No appendicitis. Vascular/Lymphatic: No acute vascular abnormality. Atheromatous calcification of the aorta and iliacs. No mass or adenopathy. Reproductive:Enlarged prostate projecting into the lower bladder. Other: No ascites or pneumoperitoneum. Musculoskeletal: No acute abnormalities. IMPRESSION: 1. No acute finding or specific cause for symptoms. 2. Numerous left more than right renal calculi.  Cholelithiasis. Electronically Signed  By: Tiburcio Pea M.D.   On: 01/28/2023 04:25    Microbiology: Results for orders placed or performed during the hospital encounter of 07/17/15  Urine culture     Status: None   Collection Time: 07/17/15  5:18 AM   Specimen: Urine, Clean Catch  Result Value Ref Range Status   Specimen Description URINE, CLEAN CATCH  Final   Special Requests NONE  Final   Culture   Final    NO GROWTH 1  DAY Performed at [facility]    Report Status 07/18/2015 FINAL  Final    Labs: CBC: Recent Labs  Lab 02/15/23 1152 02/19/23 1052 02/20/23 0426 02/21/23 0439  WBC  --  10.6* 9.1 8.3  NEUTROABS  --  6.3  --   --   HGB 9.2* 8.9* 8.4* 8.5*  HCT 27.0* 27.9* 25.5* 26.0*  MCV  --  93.6 92.7 93.2  PLT  --  253 199 193   Basic Metabolic Panel: Recent Labs  Lab 02/15/23 1152 02/19/23 1052 02/20/23 0426 02/21/23 0439  NA 131* 133* 131* 130*  K 4.1 3.9 4.2 3.7  CL 99 106 106 103  CO2  --  20* 19* 20*  GLUCOSE 77 110* 113* 120*  BUN 23* 43* 39* 40*  CREATININE 0.90 0.97 0.87 0.97  CALCIUM  --  8.8* 8.6* 8.4*   Liver Function Tests: Recent Labs  Lab 02/19/23 1052 02/21/23 0439  AST 151* 87*  ALT 158* 123*  ALKPHOS 80 60  BILITOT 0.4 0.4  PROT 8.1 7.4  ALBUMIN 2.7* 2.5*   CBG: No results for input(s): "GLUCAP" in the last 168 hours.  Discharge time spent: greater than 30 minutes.  Signed: Meredeth Ide, MD [facility] 02/21/2023

## 2023-02-21 NOTE — Progress Notes (Signed)
.  Transition of Care [facility]) - Inpatient Brief Assessment   Patient Details  Name: Stuart Collins MRN: [id number] Date of Birth: 12-Jul-1966  Transition of Care [facility]) CM/SW Contact:    Larrie Kass, LCSW Phone Number: 02/21/2023, 12:09 PM   Clinical Narrative:  Transition of Care Department [facility]) has reviewed patient and no TOC needs have been identified at this time. We will continue to monitor patient advancement through interdisciplinary progression rounds. If new patient transition needs arise, please place a TOC consult.  Transition of Care Asessment: Insurance and Status: Insurance coverage has been reviewed Patient has primary care physician: Yes     Prior/Current Home Services: No current home services Social Determinants of Health Reivew: SDOH reviewed no interventions necessary Readmission risk has been reviewed: Yes Transition of care needs: no transition of care needs at this time

## 2023-02-21 NOTE — Progress Notes (Signed)
Heart Failure Navigator Progress Note  Assessed for Heart & Vascular TOC clinic readiness.  Patient does not meet criteria due to EF 65-70%, has a scheduled [facility] follow up appointment on 02/28/2023. .   Navigator available for reassessment of patient.   Rhae Hammock, BSN, Scientist, clinical (histocompatibility and immunogenetics) Only

## 2023-02-21 NOTE — Progress Notes (Signed)
Cardiology Progress Note  Patient ID: Stuart Collins MRN: [id number] DOB: 01-03-1966 Date of Encounter: 02/21/2023  Primary Cardiologist: Chrystie Nose, MD  Subjective   Chief Complaint: none.   HPI: Still with lower extremity edema.  Good urine output.  No chest pain or shortness of breath reported.  ROS:  All other ROS reviewed and negative. Pertinent positives noted in the HPI.     Inpatient Medications  Scheduled Meds:  azelastine  2 spray Each Nare BID   enoxaparin (LOVENOX) injection  40 mg Subcutaneous Q24H   fluticasone  2 spray Each Nare Daily   furosemide  40 mg Intravenous BID   loratadine  10 mg Oral QHS   montelukast  10 mg Oral QHS   pantoprazole  20 mg Oral BID AC   potassium chloride  20 mEq Oral Daily   predniSONE  20 mg Oral TID WC   spironolactone  12.5 mg Oral Daily   Continuous Infusions:  PRN Meds: acetaminophen **OR** acetaminophen, ondansetron **OR** ondansetron (ZOFRAN) IV, ramelteon   Vital Signs   Vitals:   02/20/23 1219 02/20/23 2039 02/21/23 0511 02/21/23 0616  BP: (!) 164/91 (!) 142/87  139/82  Pulse: (!) 53 (!) 50  (!) 51  Resp: 16 18  19   Temp: 98.9 F (37.2 C) 98.3 F (36.8 C)  97.9 F (36.6 C)  TempSrc: Oral Oral  Oral  SpO2: 100% 99%  100%  Weight:   67.1 kg   Height:        Intake/Output Summary (Last 24 hours) at 02/21/2023 0955 Last data filed at 02/21/2023 0800 Gross per 24 hour  Intake 1440 ml  Output 3750 ml  Net -2310 ml      02/21/2023    5:11 AM 02/20/2023    5:56 AM 02/19/2023   10:26 AM  Last 3 Weights  Weight (lbs) 147 lb 14.4 oz 153 lb 3.5 oz 158 lb  Weight (kg) 67.087 kg 69.5 kg 71.668 kg      Telemetry  Overnight telemetry shows sinus bradycardia 40 to 50 bpm, which I personally reviewed.   Physical Exam   Vitals:   02/20/23 1219 02/20/23 2039 02/21/23 0511 02/21/23 0616  BP: (!) 164/91 (!) 142/87  139/82  Pulse: (!) 53 (!) 50  (!) 51  Resp: 16 18  19   Temp: 98.9 F (37.2 C) 98.3 F (36.8 C)  97.9 F  (36.6 C)  TempSrc: Oral Oral  Oral  SpO2: 100% 99%  100%  Weight:   67.1 kg   Height:        Intake/Output Summary (Last 24 hours) at 02/21/2023 0955 Last data filed at 02/21/2023 0800 Gross per 24 hour  Intake 1440 ml  Output 3750 ml  Net -2310 ml       02/21/2023    5:11 AM 02/20/2023    5:56 AM 02/19/2023   10:26 AM  Last 3 Weights  Weight (lbs) 147 lb 14.4 oz 153 lb 3.5 oz 158 lb  Weight (kg) 67.087 kg 69.5 kg 71.668 kg    Body mass index is 21.84 kg/m.  General: Well nourished, well developed, in no acute distress Head: Atraumatic, normal size  Eyes: PEERLA, EOMI  Neck: Supple, no JVD Endocrine: No thryomegaly Cardiac: Normal S1, S2; RRR; no murmurs, rubs, or gallops Lungs: Clear to auscultation bilaterally, no wheezing, rhonchi or rales  Abd: Soft, nontender, no hepatomegaly  Ext: 2+ pitting edema up to mid shins Musculoskeletal: No deformities, BUE and BLE strength normal  and equal Skin: Warm and dry, no rashes   Neuro: Alert and oriented to person, place, time, and situation, CNII-XII grossly intact, no focal deficits  Psych: Normal mood and affect   Labs  High Sensitivity Troponin:   Recent Labs  Lab 02/19/23 1052 02/19/23 1236  TROPONINIHS 67* 48*     Cardiac EnzymesNo results for input(s): "TROPONINI" in the last 168 hours. No results for input(s): "TROPIPOC" in the last 168 hours.  Chemistry Recent Labs  Lab 02/19/23 1052 02/20/23 0426 02/21/23 0439  NA 133* 131* 130*  K 3.9 4.2 3.7  CL 106 106 103  CO2 20* 19* 20*  GLUCOSE 110* 113* 120*  BUN 43* 39* 40*  CREATININE 0.97 0.87 0.97  CALCIUM 8.8* 8.6* 8.4*  PROT 8.1  --  7.4  ALBUMIN 2.7*  --  2.5*  AST 151*  --  87*  ALT 158*  --  123*  ALKPHOS 80  --  60  BILITOT 0.4  --  0.4  GFRNONAA >60 >60 >60  ANIONGAP 7 6 7     Hematology Recent Labs  Lab 02/19/23 1052 02/20/23 0426 02/21/23 0439  WBC 10.6* 9.1 8.3  RBC 2.98* 2.75* 2.79*  HGB 8.9* 8.4* 8.5*  HCT 27.9* 25.5* 26.0*  MCV 93.6 92.7  93.2  MCH 29.9 30.5 30.5  MCHC 31.9 32.9 32.7  RDW 16.1* 16.1* 16.5*  PLT 253 199 193   BNP Recent Labs  Lab 02/19/23 1052  BNP 700.8*    DDimer No results for input(s): "DDIMER" in the last 168 hours.   Radiology  ECHOCARDIOGRAM LIMITED  Result Date: 02/20/2023    ECHOCARDIOGRAM LIMITED REPORT   Patient Name:   [name] Date of Exam: 02/20/2023 Medical Rec #:  [id number]    Height:       69.0 in Accession #:    [id number]   Weight:       153.2 lb Date of Birth:  10/24/1965     BSA:          1.845 m Patient Age:    57 years     BP:           157/92 mmHg Patient Gender: M            HR:           58 bpm. Exam Location:  Inpatient Procedure: 3D Echo, Limited Echo, Cardiac Doppler and Color Doppler Indications:    I50.40* Unspecified combined systolic (congestive) and diastolic                 (congestive) heart failure  History:        Patient has prior history of Echocardiogram examinations, most                 recent 02/13/2023. CHF, CAD, Abnormal ECG,                 Arrythmias:Bradycardia, Signs/Symptoms:Dyspnea and Shortness of                 Breath; Risk Factors:Dyslipidemia.  Sonographer:    Sheralyn Boatman RDCS Referring Phys: [id number] Ronnald Ramp O'NEAL IMPRESSIONS  1. Left ventricular ejection fraction, by estimation, is 65 to 70%. Left ventricular ejection fraction by 3D volume is 68 %. The left ventricle has normal function. The left ventricle has no regional wall motion abnormalities.  2. Right ventricular systolic function is normal. The right ventricular size is mildly enlarged. A Prominent moderator band is visualized. There  is moderately elevated pulmonary artery systolic pressure. The estimated right ventricular systolic pressure  is 59.4 mmHg.  3. The mitral valve is normal in structure. Mild mitral valve regurgitation. No evidence of mitral stenosis.  4. The aortic valve is tricuspid. Aortic valve regurgitation is not visualized. Aortic valve sclerosis/calcification is present, without  any evidence of aortic stenosis.  5. The inferior vena cava is dilated in size with <50% respiratory variability, suggesting right atrial pressure of 15 mmHg. FINDINGS  Left Ventricle: Left ventricular ejection fraction, by estimation, is 65 to 70%. Left ventricular ejection fraction by 3D volume is 68 %. The left ventricle has normal function. The left ventricle has no regional wall motion abnormalities. The left ventricular internal cavity size was normal in size. There is no left ventricular hypertrophy. Right Ventricle: The right ventricular size is mildly enlarged. No increase in right ventricular wall thickness. Right ventricular systolic function is normal. There is moderately elevated pulmonary artery systolic pressure. The tricuspid regurgitant velocity is 3.33 m/s, and with an assumed right atrial pressure of 15 mmHg, the estimated right ventricular systolic pressure is 59.4 mmHg. Left Atrium: Left atrial size was normal in size. Right Atrium: Right atrial size was normal in size. Pericardium: There is no evidence of pericardial effusion. Mitral Valve: The mitral valve is normal in structure. Mild mitral valve regurgitation. No evidence of mitral valve stenosis. Tricuspid Valve: The tricuspid valve is normal in structure. Tricuspid valve regurgitation is mild . No evidence of tricuspid stenosis. Aortic Valve: The aortic valve is tricuspid. Aortic valve regurgitation is not visualized. Aortic valve sclerosis/calcification is present, without any evidence of aortic stenosis. Pulmonic Valve: The pulmonic valve was normal in structure. Pulmonic valve regurgitation is trivial. No evidence of pulmonic stenosis. Aorta: The aortic root is normal in size and structure. Venous: The inferior vena cava is dilated in size with less than 50% respiratory variability, suggesting right atrial pressure of 15 mmHg. IAS/Shunts: No atrial level shunt detected by color flow Doppler. Additional Comments: A Prominent moderator  band is visualized. Spectral Doppler performed. Color Doppler performed.  LEFT VENTRICLE PLAX 2D LVIDd:         5.10 cm LVIDs:         2.80 cm LV PW:         0.90 cm         3D Volume EF LV IVS:        0.90 cm         LV 3D EF:    Left LVOT diam:     2.30 cm                      ventricul LVOT Area:     4.15 cm                     ar                                             ejection                                             fraction LV Volumes (MOD)  by 3D LV vol d, MOD    98.7 ml                    volume is A2C:                                        68 %. LV vol d, MOD    74.2 ml A4C: LV vol s, MOD    24.0 ml       3D Volume EF: A2C:                           3D EF:        68 % LV vol s, MOD    24.8 ml A4C: LV SV MOD A2C:   74.7 ml LV SV MOD A4C:   74.2 ml LV SV MOD BP:    64.0 ml IVC IVC diam: 2.60 cm LEFT ATRIUM         Index LA diam:    4.60 cm 2.49 cm/m                        PULMONIC VALVE AORTA                 PR End Diast Vel: 1.79 msec Ao Root diam: 3.40 cm Ao Asc diam:  2.80 cm MITRAL VALVE               TRICUSPID VALVE MV Area (PHT): 3.72 cm    TR Peak grad:   44.4 mmHg MV Decel Time: 204 msec    TR Vmax:        333.00 cm/s MV E velocity: 93.35 cm/s MV A velocity: 48.95 cm/s  SHUNTS MV E/A ratio:  1.91        Systemic Diam: 2.30 cm Armanda Magic MD Electronically signed by Armanda Magic MD Signature Date/Time: 02/20/2023/9:27:11 AM    Final    CT Angio Chest PE W and/or Wo Contrast  Result Date: 02/19/2023 CLINICAL DATA:  Pulmonary embolism (PE) suspected, high prob EXAM: CT ANGIOGRAPHY CHEST WITH CONTRAST TECHNIQUE: Multidetector CT imaging of the chest was performed using the standard protocol during bolus administration of intravenous contrast. Multiplanar CT image reconstructions and MIPs were obtained to evaluate the vascular anatomy. RADIATION DOSE REDUCTION: This exam was performed according to the departmental dose-optimization program which includes automated  exposure control, adjustment of the mA and/or kV according to patient size and/or use of iterative reconstruction technique. CONTRAST:  75mL OMNIPAQUE IOHEXOL 350 MG/ML SOLN COMPARISON:  03/31/2014 FINDINGS: Cardiovascular: No filling defects in the pulmonary arteries to suggest pulmonary emboli. Heart is normal size. Aorta is normal caliber. Coronary artery calcifications in the left coronary arteries. Mediastinum/Nodes: No mediastinal, hilar, or axillary adenopathy. Trachea and esophagus are unremarkable. Thyroid unremarkable. Lungs/Pleura: Small bilateral pleural effusions. Bibasilar airspace opacities could reflect atelectasis or infiltrates. Upper Abdomen: No acute findings 11 mm gallstone within the gallbladder. Musculoskeletal: Chest wall soft tissues are unremarkable. No acute bony abnormality. Review of the MIP images confirms the above findings. IMPRESSION: No evidence of pulmonary embolus. Small bilateral pleural effusions. Bibasilar atelectasis or infiltrates. Coronary artery disease. Cholelithiasis. Electronically Signed   By: Charlett Nose M.D.   On: 02/19/2023 13:33    Cardiac Studies  TTE 02/20/2023  1. Left ventricular ejection fraction, by estimation, is  65 to 70%. Left  ventricular ejection fraction by 3D volume is 68 %. The left ventricle has  normal function. The left ventricle has no regional wall motion  abnormalities.   2. Right ventricular systolic function is normal. The right ventricular  size is mildly enlarged. A Prominent moderator band is visualized. There  is moderately elevated pulmonary artery systolic pressure. The estimated  right ventricular systolic pressure   is 59.4 mmHg.   3. The mitral valve is normal in structure. Mild mitral valve  regurgitation. No evidence of mitral stenosis.   4. The aortic valve is tricuspid. Aortic valve regurgitation is not  visualized. Aortic valve sclerosis/calcification is present, without any  evidence of aortic stenosis.   5. The  inferior vena cava is dilated in size with <50% respiratory  variability, suggesting right atrial pressure of 15 mmHg.   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 Cardiology consulted for questionable heart failure.   Assessment & Plan   # Volume overload # Polymyositis? # Pulmonary hypertension # Transaminitis # Elevated troponin, demand # Hypoalbuminemia -Admitted with volume overload.  Echocardiogram shows normal diastolic parameters but now has some element of pulmonary hypertension.  Awaiting final rheumatologic diagnosis but there are concerns for polymyositis.  Albumin is low.  Diastolic parameters are normal.  Suspect this is multifactorial.  No cirrhosis on CT. -Good diuresis.  Still slightly volume up.  He reports he wants to go home.  Will give him an additional 40 mg of IV Lasix today.  He can then be discharged home on 40 mg p.o. twice daily for 3 days.  After 3 days he will go to once daily dosing.  Continue Aldactone 12.5 mg daily.  We will arrange close follow-up. -He will need repeat echo to evaluate pulmonary pressures.  He could end up having some element of primary pulmonary hypertension if he is diagnosed with an autoimmune disorder that is associated with this.  Would await the formal diagnosis.  He has a muscle biopsy that is pending.  This can be worked up as an outpatient. -Troponin elevation is secondary to volume overload.  Nonischemic.  No symptoms of angina.  # Sinus bradycardia -Asymptomatic.  Avoid AV nodal agents.  [facility] will sign off.   Medication Recommendations: As above Other recommendations (labs, testing, etc): None Follow up as an outpatient: We will arrange close follow-up in 1 to 2 weeks  For questions or updates, please contact [facility] Please consult [email or web address] for contact info under         Signed, Gerri Spore T. Flora Lipps, MD, [facility]  [facility]  02/21/2023 9:55 AM

## 2023-02-21 NOTE — Progress Notes (Signed)
Initial Nutrition Assessment  DOCUMENTATION CODES:   Non-severe (moderate) malnutrition in context of chronic illness  INTERVENTION:  - Heart Healthy diet with fluid restriction per MD. - Ensure Plus High Protein po BID, each supplement provides 350 kcal and 20 grams of protein. - Monitor weight trends.   NUTRITION DIAGNOSIS:   Moderate Malnutrition related to chronic illness as evidenced by moderate fat depletion, moderate muscle depletion.  GOAL:   Patient will meet greater than or equal to 90% of their needs  MONITOR:   PO intake, Supplement acceptance, Weight trends, Labs  REASON FOR ASSESSMENT:   Consult Assessment of nutrition requirement/status  ASSESSMENT:   57 y.o. male with PMH of CAD, transaminitis, NASH, GERD, basal cell carcinoma, nephrolithiasis, HLD, rheumatoid arthritis, Raynaud's disease who presented with complaints of progressively worse dyspnea. Admitted for volume overload.   Patient reports a UBW of 155# and endorses weight gain with subsequent loss from fluid over the past few days. No changes prior to this.   He endorses eating at least 2 meals a day at home, up to 3 depending on the day. Appetite has been good recently.   Current appetite is also good and patient endorses eating well since admission. Documented to be consuming 100% of meals since admit.  He has never tried nutrition supplements but agreeable to try some. Notes he should be discharging today.   Medications reviewed and include: Lasix  Labs reviewed:  Na 130   NUTRITION - FOCUSED PHYSICAL EXAM:  Flowsheet Row Most Recent Value  Orbital Region Mild depletion  Upper Arm Region Severe depletion  Thoracic and Lumbar Region Moderate depletion  Buccal Region Moderate depletion  Temple Region Moderate depletion  Clavicle Bone Region Mild depletion  Clavicle and Acromion Bone Region Moderate depletion  Scapular Bone Region Unable to assess  Dorsal Hand No depletion   Patellar Region Mild depletion  Anterior Thigh Region Mild depletion  Posterior Calf Region No depletion  Edema (RD Assessment) None  Hair Reviewed  Eyes Reviewed  Mouth Reviewed  Skin Reviewed  Nails Reviewed       Diet Order:   Diet Order             Diet - low sodium heart healthy           Diet Heart Room service appropriate? Yes; Fluid consistency: Thin; Fluid restriction: 1200 mL Fluid  Diet effective now                   EDUCATION NEEDS:  Education needs have been addressed  Skin:  Skin Assessment: Reviewed RN Assessment  Last BM:  PTA  Height:  Ht Readings from Last 1 Encounters:  02/19/23 5\' 9"  (1.753 m)   Weight:  Wt Readings from Last 1 Encounters:  02/21/23 67.1 kg    BMI:  Body mass index is 21.84 kg/m.  Estimated Nutritional Needs:  Kcal:  2000-2200 kcals Protein:  85-100 grams Fluid:  >/= 2L    Shelle Iron RD, LDN For contact information, refer to [facility].

## 2023-02-25 ENCOUNTER — Inpatient Hospital Stay: Payer: 59 | Admitting: Internal Medicine

## 2023-02-28 ENCOUNTER — Encounter: Payer: Self-pay | Admitting: Nurse Practitioner

## 2023-02-28 ENCOUNTER — Other Ambulatory Visit: Payer: Self-pay

## 2023-02-28 ENCOUNTER — Ambulatory Visit: Payer: Managed Care, Other (non HMO) | Attending: Nurse Practitioner | Admitting: Nurse Practitioner

## 2023-02-28 VITALS — BP 154/82 | HR 76 | Ht 69.0 in | Wt 153.6 lb

## 2023-02-28 DIAGNOSIS — I10 Essential (primary) hypertension: Secondary | ICD-10-CM | POA: Diagnosis not present

## 2023-02-28 DIAGNOSIS — E785 Hyperlipidemia, unspecified: Secondary | ICD-10-CM

## 2023-02-28 DIAGNOSIS — Z9189 Other specified personal risk factors, not elsewhere classified: Secondary | ICD-10-CM

## 2023-02-28 DIAGNOSIS — R931 Abnormal findings on diagnostic imaging of heart and coronary circulation: Secondary | ICD-10-CM

## 2023-02-28 DIAGNOSIS — M3320 Polymyositis, organ involvement unspecified: Secondary | ICD-10-CM

## 2023-02-28 DIAGNOSIS — R001 Bradycardia, unspecified: Secondary | ICD-10-CM

## 2023-02-28 DIAGNOSIS — E8809 Other disorders of plasma-protein metabolism, not elsewhere classified: Secondary | ICD-10-CM

## 2023-02-28 DIAGNOSIS — E440 Moderate protein-calorie malnutrition: Secondary | ICD-10-CM

## 2023-02-28 DIAGNOSIS — I272 Pulmonary hypertension, unspecified: Secondary | ICD-10-CM

## 2023-02-28 DIAGNOSIS — R7401 Elevation of levels of liver transaminase levels: Secondary | ICD-10-CM

## 2023-02-28 MED ORDER — OLMESARTAN MEDOXOMIL 20 MG PO TABS: 20.0000 mg | ORAL_TABLET | Freq: Every day | ORAL | 3 refills | Status: DC

## 2023-02-28 NOTE — Progress Notes (Signed)
Office Visit    Patient Name: Stuart Collins Date of Encounter: 02/28/2023  Primary Care Provider:  Ralene Ok, MD Primary Cardiologist:  Chrystie Nose, MD  Chief Complaint    57 year old male with a history of elevated coronary calcium score, sinus bradycardia, hypertension, hyperlipidemia, myositis, protein calorie malnutrition, rheumatoid arthritis, NASH, transaminitis, asthma, GERD, renal cell carcinoma, Raynaud's disease, and sciatica who presents for hospital follow-up related to fluid volume overload.  Past Medical History    Past Medical History:  Diagnosis Date   Allergic arthritis    Asthma, mild intermittent    followed by dr Elvera Lennox. Irena Cords ([place] [facility])   Coronary artery calcification seen on CAT scan 03/2014   cardiologist--- dr Rennis Golden;  calcium score=65.5 involving LAD   Dyspnea    due to weakness   Elevated liver enzymes    referred to [facility]--- dr Marca Ancona by pcp   Family history of adverse reaction to anesthesia    mother and father--- ponv   Family history of premature CAD    NUCLEAR STRESS TEST, 12/30/2002 - no evidence of ischemia   GERD (gastroesophageal reflux disease)    Hepatic steatosis determined by biopsy of liver 2003   History of basal cell carcinoma (BCC) excision 08/18/2014   12/ 2018 nodular right chest tx; cx3 50fu;   03/ 2023  right breast (curet and 5FU)   History of kidney stones    urologist--- dr Mena Goes   Hyperlipidemia, mixed    followed by dr hilty   Muscle weakness (generalized)    02-05-2023  per pt using cane   NASH (nonalcoholic steatohepatitis)    followed by dr Marca Ancona  (GI)   Nocturia    RA (rheumatoid arthritis) [facility])    rheumatologist--- dr Dierdre Forth;  multiple sites   Raynaud's disease    Sciatica, right side    Wears glasses    Past Surgical History:  Procedure Laterality Date   CYSTOSCOPY W/ URETERAL STENT PLACEMENT  12/02/2011   Procedure: CYSTOSCOPY WITH RETROGRADE PYELOGRAM/URETERAL STENT  PLACEMENT;  Surgeon: Kathi Ludwig, MD;  Location: [facility];  Service: Urology;  Laterality: Right;   CYSTOSCOPY W/ URETERAL STENT PLACEMENT  12/15/2011   Procedure: CYSTOSCOPY WITH STENT REPLACEMENT;  Surgeon: Milford Cage, MD;  Location: [facility];  Service: Urology;  Laterality: Right;  right reter stent removal and stent placement   CYSTOSCOPY/RETROGRADE/URETEROSCOPY  12/15/2011   Procedure: CYSTOSCOPY/RETROGRADE/URETEROSCOPY;  Surgeon: Milford Cage, MD;  Location: [facility];  Service: Urology;  Laterality: Right;  no retrograde   LIVER BIOPSY  05/29/2002   MUSCLE BIOPSY Left 02/15/2023   Procedure: LEFT THIGH MUSCLE BIOPSY;  Surgeon: Fritzi Mandes, MD;  Location: [facility] [place];  Service: General;  Laterality: Left;   ORCHIOPEXY  1970   TONSILLECTOMY  1970   UPPER GI ENDOSCOPY  03/17/2002    Allergies  Allergies  Allergen Reactions   Mushroom Extract Complex Shortness Of Breath and Other (See Comments)    All types mushrooms   Other Shortness Of Breath and Other (See Comments)    "ALL NUTS"   Peanut-Containing Drug Products Shortness Of Breath   Shellfish Allergy Hives, Shortness Of Breath and Other (See Comments)    All shellfish   Tape Rash and Other (See Comments)    The CLEAR, PLASTIC tape used in the hospital BREAKS OUT THE SKIN!!   Morphine Other (See Comments)     "makes my head feel like its stuffed; like  it's going to pop"   Codeine Other (See Comments)     "makes my head feel like its stuffed; like it's going to pop"     Labs/Other Studies Reviewed    The following studies were reviewed today:  Cardiac Studies & Procedures     STRESS TESTS  NM MYOCAR MULTI W/SPECT W 02/27/2013   ECHOCARDIOGRAM  ECHOCARDIOGRAM LIMITED 02/20/2023  Narrative ECHOCARDIOGRAM LIMITED REPORT    Patient Name:   Stuart Collins Date of Exam: 02/20/2023 Medical Rec #:  [id number]    Height:       69.0 in Accession #:    [id number]   Weight:       153.2  lb Date of Birth:  January 07, 1966     BSA:          1.845 m Patient Age:    57 years     BP:           157/92 mmHg Patient Gender: M            HR:           58 bpm. Exam Location:  Inpatient  Procedure: 3D Echo, Limited Echo, Cardiac Doppler and Color Doppler  Indications:    I50.40* Unspecified combined systolic (congestive) and diastolic (congestive) heart failure  History:        Patient has prior history of Echocardiogram examinations, most recent 02/13/2023. CHF, CAD, Abnormal ECG, Arrythmias:Bradycardia, Signs/Symptoms:Dyspnea and Shortness of Breath; Risk Factors:Dyslipidemia.  Sonographer:    Sheralyn Boatman RDCS Referring Phys: [id number] Ronnald Ramp O'NEAL  IMPRESSIONS   1. Left ventricular ejection fraction, by estimation, is 65 to 70%. Left ventricular ejection fraction by 3D volume is 68 %. The left ventricle has normal function. The left ventricle has no regional wall motion abnormalities. 2. Right ventricular systolic function is normal. The right ventricular size is mildly enlarged. A Prominent moderator band is visualized. There is moderately elevated pulmonary artery systolic pressure. The estimated right ventricular systolic pressure is 59.4 mmHg. 3. The mitral valve is normal in structure. Mild mitral valve regurgitation. No evidence of mitral stenosis. 4. The aortic valve is tricuspid. Aortic valve regurgitation is not visualized. Aortic valve sclerosis/calcification is present, without any evidence of aortic stenosis. 5. The inferior vena cava is dilated in size with <50% respiratory variability, suggesting right atrial pressure of 15 mmHg.  FINDINGS Left Ventricle: Left ventricular ejection fraction, by estimation, is 65 to 70%. Left ventricular ejection fraction by 3D volume is 68 %. The left ventricle has normal function. The left ventricle has no regional wall motion abnormalities. The left ventricular internal cavity size was normal in size. There is no left ventricular  hypertrophy.  Right Ventricle: The right ventricular size is mildly enlarged. No increase in right ventricular wall thickness. Right ventricular systolic function is normal. There is moderately elevated pulmonary artery systolic pressure. The tricuspid regurgitant velocity is 3.33 m/s, and with an assumed right atrial pressure of 15 mmHg, the estimated right ventricular systolic pressure is 59.4 mmHg.  Left Atrium: Left atrial size was normal in size.  Right Atrium: Right atrial size was normal in size.  Pericardium: There is no evidence of pericardial effusion.  Mitral Valve: The mitral valve is normal in structure. Mild mitral valve regurgitation. No evidence of mitral valve stenosis.  Tricuspid Valve: The tricuspid valve is normal in structure. Tricuspid valve regurgitation is mild . No evidence of tricuspid stenosis.  Aortic Valve: The aortic valve is tricuspid. Aortic valve regurgitation is  not visualized. Aortic valve sclerosis/calcification is present, without any evidence of aortic stenosis.  Pulmonic Valve: The pulmonic valve was normal in structure. Pulmonic valve regurgitation is trivial. No evidence of pulmonic stenosis.  Aorta: The aortic root is normal in size and structure.  Venous: The inferior vena cava is dilated in size with less than 50% respiratory variability, suggesting right atrial pressure of 15 mmHg.  IAS/Shunts: No atrial level shunt detected by color flow Doppler.  Additional Comments: A Prominent moderator band is visualized. Spectral Doppler performed. Color Doppler performed.  LEFT VENTRICLE PLAX 2D LVIDd:         5.10 cm LVIDs:         2.80 cm LV PW:         0.90 cm         3D Volume EF LV IVS:        0.90 cm         LV 3D EF:    Left LVOT diam:     2.30 cm                      ventricul LVOT Area:     4.15 cm                     ar ejection fraction LV Volumes (MOD)                            by 3D LV vol d, MOD    98.7 ml                     volume is A2C:                                        68 %. LV vol d, MOD    74.2 ml A4C: LV vol s, MOD    24.0 ml       3D Volume EF: A2C:                           3D EF:        68 % LV vol s, MOD    24.8 ml A4C: LV SV MOD A2C:   74.7 ml LV SV MOD A4C:   74.2 ml LV SV MOD BP:    64.0 ml  IVC IVC diam: 2.60 cm  LEFT ATRIUM         Index LA diam:    4.60 cm 2.49 cm/m PULMONIC VALVE AORTA                 PR End Diast Vel: 1.79 msec Ao Root diam: 3.40 cm Ao Asc diam:  2.80 cm  MITRAL VALVE               TRICUSPID VALVE MV Area (PHT): 3.72 cm    TR Peak grad:   44.4 mmHg MV Decel Time: 204 msec    TR Vmax:        333.00 cm/s MV E velocity: 93.35 cm/s MV A velocity: 48.95 cm/s  SHUNTS MV E/A ratio:  1.91        Systemic Diam: 2.30 cm  Armanda Magic MD Electronically signed by Armanda Magic MD Signature Date/Time: 02/20/2023/9:27:11 AM    Final  CT SCANS  CT CARDIAC SCORING (SELF PAY ONLY) 04/08/2014  Addendum 04/08/2014  9:28 AM ADDENDUM REPORT: 04/08/2014 09:25  CLINICAL DATA:  Risk stratification  EXAM: Coronary Calcium Score  TECHNIQUE: The patient was scanned on a Siemens Sensation 16 slice scanner. Axial non-contrast 3mm slices were carried out through the heart. The data set was analyzed on a dedicated work station and scored using the Agatson method.  FINDINGS: Non-cardiac: No significant non cardiac findings on limited lung and soft tissue windows. See separate report from [facility].  Ascending Aorta:  2.8 cm  Pericardium: Normal  Coronary arteries: Small amount of linear calcium in the proximal to mid LAD 65.5  IMPRESSION: Coronary calcium score of 65.5 . This was 75th percentile for age and sex matched control.  Charlton Haws   Electronically Signed By: Charlton Haws M.D. On: 04/08/2014 09:25  Narrative EXAM: OVER-READ INTERPRETATION  CT CHEST  The following report is an over-read performed by radiologist Dr. Arliss Journey [facility] on 03/31/2014. This over-read does not include interpretation of cardiac or coronary anatomy or pathology. The coronary calcium score/coronary CTA interpretation by the cardiologist is attached.  COMPARISON:  Chest radiograph of 04/15/2013.  No prior CT.  FINDINGS: Lung windows demonstrate no nodules or airspace opacities.  Soft tissue windows demonstrate no pleural fluid. No image thoracic adenopathy.  Limited abdominal imaging demonstrates suspicion of moderate hepatic steatosis, incompletely imaged.  No acute osseous abnormality.  IMPRESSION: 1. No significant extracardiac findings within the imaged chest. 2. Incompletely imaged hepatic steatosis.  Electronically Signed: By: Jeronimo Greaves M.D. On: 03/31/2014 10:58         Recent Labs: 02/14/2023: Magnesium 1.7 02/19/2023: B Natriuretic Peptide 700.8; TSH 5.595 02/21/2023: ALT 123; BUN 40; Creatinine, Ser 0.97; Hemoglobin 8.5; Platelets 193; Potassium 3.7; Sodium 130  Recent Lipid Panel    Component Value Date/Time   CHOL 165 12/21/2022 0756   CHOL 142 07/16/2014 0801   TRIG 228 (H) 12/21/2022 0756   TRIG 135 07/16/2014 0801   HDL 25 (L) 12/21/2022 0756   HDL 42 07/16/2014 0801   CHOLHDL 6.6 (H) 12/21/2022 0756   LDLCALC 101 (H) 12/21/2022 0756   LDLCALC 73 07/16/2014 0801    History of Present Illness    57 year old male with the above past medical history including elevated coronary calcium score, sinus bradycardia, hypertension, hyperlipidemia, myositis, protein calorie malnutrition, rheumatoid arthritis, NASH, transaminitis, asthma, GERD, renal cell carcinoma, Raynaud's disease, and sciatica.  CT calcium scoring in 03/2014 showed coronary calcium score of 65.5 (75th percentile).  He was last seen in the office on 07/27/2022 and was stable overall from a cardiac standpoint.  Liver enzymes were elevated.  He was hospitalized in May 2024 in the setting of hypervolemic hyponatremia,  hypoalbuminemia, concern for myositis (biopsy pending).  More recently he presented to the ED on 02/19/2023 with complaints of dyspnea on exertion.  Troponin was elevated-this was felt to be in the setting of demand ischemia secondary to fluid volume overload.  Cardiology was consulted.  Echocardiogram revealed EF 65 to 70%, normal LV function, no RWMA, normal RV systolic function, mild RV enlargement, moderately elevated PASP, mild mitral valve regurgitation.  It was felt that his fluid volume overload was not likely the result of acute CHF exacerbation, but rather  multifactorial in the setting of low albumin, active inflammatory condition (possible polymyositis).  Correction of protein deficiency, ongoing diuresis were recommended.  Additionally, repeat echocardiogram was recommended as a outpatient to evaluate pulmonary  pressures. He was discharged home in stable condition.   He presents today for follow-up.  Since his hospitalization he has done well from a cardiac standpoint.  He denies any chest pain, dyspnea, edema, PND, orthopnea, weight gain.   He is still awaiting the results of his biopsy to determine whether or not he has myositis.  BP is elevated.  He has developed mouth sores since starting spironolactone and prednisone.  He wonders if this could be related to on these medications. Overall, he reportsfeeling well.   Home Medications    Current Outpatient Medications  Medication Sig Dispense Refill   albuterol (VENTOLIN HFA) 108 (90 Base) MCG/ACT inhaler Inhale 1-2 puffs into the lungs every 4 (four) hours as needed for wheezing or shortness of breath.     AUVI-Q 0.3 MG/0.3ML SOAJ injection Inject 0.3 mg into the muscle as needed for anaphylaxis.     azelastine (ASTELIN) 137 MCG/SPRAY nasal spray Place 2 sprays into the nose 2 (two) times daily.     BEPREVE 1.5 % SOLN Place 1 drop into both eyes 2 (two) times daily as needed for allergies.  5   cetirizine (ZYRTEC) 10 MG tablet Take 10 mg by  mouth at bedtime.      fluticasone (FLONASE) 50 MCG/ACT nasal spray Place 2 sprays into the nose in the morning.     furosemide (LASIX) 40 MG tablet Take 1 tablet (40 mg total) by mouth in the morning. Take Lasix 40 mg po twice daily for 3 days then start taking 40 mg once daily from 02/24/23 30 tablet 2   lansoprazole (PREVACID) 30 MG capsule Take 30 mg by mouth 2 (two) times daily before a meal.     montelukast (SINGULAIR) 10 MG tablet Take 10 mg by mouth at bedtime.     Multiple Vitamin (MULITIVITAMIN WITH MINERALS) TABS Take 1 tablet by mouth daily with breakfast.     ORENCIA 125 MG/ML SOSY Inject 125 mg into the skin every Sunday.     Potassium Citrate 15 MEQ (1620 MG) TBCR Take 15 mEq by mouth daily.     predniSONE (DELTASONE) 20 MG tablet Take 20 mg by mouth 3 (three) times daily with meals.     ramelteon (ROZEREM) 8 MG tablet Take 8 mg by mouth at bedtime as needed for sleep.     spironolactone (ALDACTONE) 25 MG tablet Take 0.5 tablets (12.5 mg total) by mouth daily. 30 tablet 3   sucralfate (CARAFATE) 1 g tablet Take 1 g by mouth 2 (two) times daily.     triamcinolone cream (KENALOG) 0.1 % Apply topically daily. (Patient taking differently: Apply 1 Application topically daily as needed (for itching).) 454 g 6   No current facility-administered medications for this visit.     Review of Systems    He denies chest pain, palpitations, dyspnea, pnd, orthopnea, n, v, dizziness, syncope, edema, weight gain, or early satiety. All other systems reviewed and are otherwise negative except as noted above.   Physical Exam    VS:  BP (!) 154/82   Pulse 76   Ht 5\' 9"  (1.753 m)   Wt 153 lb 9.6 oz (69.7 kg)   SpO2 100%   BMI 22.68 kg/m   GEN: Well nourished, well developed, in no acute distress. HEENT: normal. Neck: Supple, no JVD, carotid bruits, or masses. Cardiac: RRR, no murmurs, rubs, or gallops. No clubbing, cyanosis, edema.  Radials/DP/PT 2+ and equal bilaterally.  Respiratory:   Respirations regular and unlabored, clear to  auscultation bilaterally. GI: Soft, nontender, nondistended, BS + x 4. MS: no deformity or atrophy. Skin: warm and dry, no rash. Neuro:  Strength and sensation are intact. Psych: Normal affect.  Accessory Clinical Findings    ECG personally reviewed by me today -NSR, 76 bpm - no acute changes.   Lab Results  Component Value Date   WBC 8.3 02/21/2023   HGB 8.5 (L) 02/21/2023   HCT 26.0 (L) 02/21/2023   MCV 93.2 02/21/2023   PLT 193 02/21/2023   Lab Results  Component Value Date   CREATININE 0.97 02/21/2023   BUN 40 (H) 02/21/2023   NA 130 (L) 02/21/2023   K 3.7 02/21/2023   CL 103 02/21/2023   CO2 20 (L) 02/21/2023   Lab Results  Component Value Date   ALT 123 (H) 02/21/2023   AST 87 (H) 02/21/2023   ALKPHOS 60 02/21/2023   BILITOT 0.4 02/21/2023   Lab Results  Component Value Date   CHOL 165 12/21/2022   HDL 25 (L) 12/21/2022   LDLCALC 101 (H) 12/21/2022   TRIG 228 (H) 12/21/2022   CHOLHDL 6.6 (H) 12/21/2022    No results found for: "HGBA1C"  Assessment & Plan    1. Fluid volume overload/pulmonary hypertension: Echo during recent hospitalization revealed EF 65 to 70%, normal LV function, no RWMA, normal RV systolic function, mild RV enlargement, moderately elevated PASP, mild mitral valve regurgitation.  It was felt that his fluid volume overload was not likely the result of acute CHF exacerbation, but rather multifactorial in the setting of low albumin, active inflammatory condition (possible polymyositis).  Correction of protein deficiency, ongoing diuresis were recommended.  Euvolemic and well compensated on exam.  He has developed mild sore since starting spironolactone.  Out of abundance of caution, will discontinue spironolactone.  Will repeat BMET today.  Will repeat echo to evaluate pulmonary pressures.  Continue Lasix.  2. Elevated coronary calcium score: CT calcium scoring in 03/2014 showed coronary calcium score  of 65.5 (75th percentile).  Stable with no anginal symptoms.  No indication for ischemic evaluation.  3. Sinus bradycardia: EKG today shows NSR, 76 bpm.  He denies any palpitations, dizziness, presyncope, syncope.  Avoid AV nodal blocking agents.  4. Hypertension: BP is elevated in office today.  Will discontinue spironolactone as above.  Will start olmesartan 20 mg daily.  Continue to monitor BP and report BP consistently greater than 140 30/80.  Will check BMET today and in 2 weeks.  5. Hyperlipidemia: LDL was 101 in 12/2022.  He is no longer on statin therapy in the setting of transaminitis.  Can discuss further at next follow-up visit with Dr. Rennis Golden.  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  Following with rheumatology.  On prednisone.  7. Protein calorie malnutrition/Hypoalbuminemia: Albumin was 2.5 at hospital discharge.  Managed per PCP.  Continue protein supplementation.  8. Transaminitis: Ongoing.  Has seen GI in the past.  LFTs have improved slightly.  9. Disposition: Follow-up as scheduled with Dr. Rennis Golden in 06/2023.  Joylene Grapes, NP 02/28/2023, 2:43 PM

## 2023-02-28 NOTE — Patient Instructions (Signed)
Medication Instructions:  Stop Spironolactone as directed Start Olmesartan 20 mg daily  *If you need a refill on your cardiac medications before your next appointment, please call your pharmacy*   Lab Work: BMET today and repeat in 2 weeks.   Testing/Procedures: Your physician has requested that you have an echocardiogram. Echocardiography is a painless test that uses sound waves to create images of your heart. It provides your doctor with information about the size and shape of your heart and how well your heart's chambers and valves are working. This procedure takes approximately one hour. There are no restrictions for this procedure. Please do NOT wear cologne, perfume, aftershave, or lotions (deodorant is allowed). Please arrive 15 minutes prior to your appointment time.    Follow-Up: At [facility], you and your health needs are our priority.  As part of our continuing mission to provide you with exceptional heart care, we have created designated Provider Care Teams.  These Care Teams include your primary Cardiologist (physician) and Advanced Practice Providers (APPs -  Physician Assistants and Nurse Practitioners) who all work together to provide you with the care you need, when you need it.  We recommend signing up for the patient portal called "MyChart".  Sign up information is provided on this After Visit Summary.  MyChart is used to connect with patients for Virtual Visits (Telemedicine).  Patients are able to view lab/test results, encounter notes, upcoming appointments, etc.  Non-urgent messages can be sent to your provider as well.   To learn more about what you can do with MyChart, go to [email or web address].    Your next appointment:    Keep follow  up   Provider:   Chrystie Nose, MD     Other Instructions Monitor Blood pressure. Report BP consistently greater than 130/80

## 2023-03-01 LAB — BASIC METABOLIC PANEL
BUN/Creatinine Ratio: 29 — ABNORMAL HIGH (ref 9–20)
BUN: 20 mg/dL (ref 6–24)
CO2: 21 mmol/L (ref 20–29)
Calcium: 8.5 mg/dL — ABNORMAL LOW (ref 8.7–10.2)
Chloride: 95 mmol/L — ABNORMAL LOW (ref 96–106)
Creatinine, Ser: 0.68 mg/dL — ABNORMAL LOW (ref 0.76–1.27)
Glucose: 111 mg/dL — ABNORMAL HIGH (ref 70–99)
Potassium: 5.1 mmol/L (ref 3.5–5.2)
Sodium: 126 mmol/L — ABNORMAL LOW (ref 134–144)
eGFR: 107 mL/min/{1.73_m2} (ref >59)

## 2023-03-05 ENCOUNTER — Encounter (HOSPITAL_COMMUNITY): Payer: Self-pay

## 2023-03-05 ENCOUNTER — Telehealth: Payer: Self-pay

## 2023-03-05 LAB — SURGICAL PATHOLOGY

## 2023-03-05 MED ORDER — FUROSEMIDE 20 MG PO TABS: 20.0000 mg | ORAL_TABLET | Freq: Every day | ORAL | 3 refills | Status: DC

## 2023-03-05 NOTE — Telephone Encounter (Signed)
Spoke with pt. Pt was notified of lab results. Pt will decrease Lasix 20 mg daily as directed and f/u with PCP for low sodium levels.

## 2023-03-15 LAB — BASIC METABOLIC PANEL
BUN/Creatinine Ratio: 23 — ABNORMAL HIGH (ref 9–20)
BUN: 16 mg/dL (ref 6–24)
CO2: 20 mmol/L (ref 20–29)
Calcium: 8.3 mg/dL — ABNORMAL LOW (ref 8.7–10.2)
Chloride: 88 mmol/L — ABNORMAL LOW (ref 96–106)
Creatinine, Ser: 0.7 mg/dL — ABNORMAL LOW (ref 0.76–1.27)
Glucose: 115 mg/dL — ABNORMAL HIGH (ref 70–99)
Potassium: 4.3 mmol/L (ref 3.5–5.2)
Sodium: 122 mmol/L — ABNORMAL LOW (ref 134–144)
eGFR: 106 mL/min/{1.73_m2} (ref >59)

## 2023-03-19 NOTE — Telephone Encounter (Signed)
Spoke with pt. Pt was notified of lab results and recommendations to f/u with PCP about continued low sodium/electrolyte abnormalities. Low sodium levels could be contributing to pts hand cramping. Pt voiced understanding and will f/u with PCP.

## 2023-03-24 DIAGNOSIS — Z79899 Other long term (current) drug therapy: Secondary | ICD-10-CM

## 2023-03-25 ENCOUNTER — Encounter (HOSPITAL_COMMUNITY): Payer: Self-pay

## 2023-03-26 ENCOUNTER — Ambulatory Visit (HOSPITAL_COMMUNITY): Payer: 59 | Attending: Cardiology

## 2023-03-26 DIAGNOSIS — I272 Pulmonary hypertension, unspecified: Secondary | ICD-10-CM | POA: Insufficient documentation

## 2023-03-26 LAB — ECHOCARDIOGRAM COMPLETE
Area-P 1/2: 4.93 cm2
S' Lateral: 3 cm

## 2023-04-01 ENCOUNTER — Telehealth: Payer: Self-pay

## 2023-04-01 NOTE — Telephone Encounter (Signed)
Spoke with pt. Pt was notified of echo results. Pt will continue current medication and f/u as planned. Pt voiced understanding.

## 2023-04-02 ENCOUNTER — Other Ambulatory Visit (HOSPITAL_COMMUNITY): Payer: 59

## 2023-05-06 ENCOUNTER — Other Ambulatory Visit: Payer: Self-pay

## 2023-05-06 MED ORDER — FUROSEMIDE 20 MG PO TABS: 20.0000 mg | ORAL_TABLET | Freq: Every day | ORAL | 3 refills | Status: DC

## 2023-05-10 ENCOUNTER — Other Ambulatory Visit: Payer: Self-pay

## 2023-05-10 MED ORDER — OLMESARTAN MEDOXOMIL 20 MG PO TABS: 20.0000 mg | ORAL_TABLET | Freq: Every day | ORAL | 3 refills | Status: DC

## 2023-05-10 NOTE — Telephone Encounter (Signed)
Refill request for mail order.

## 2023-06-19 ENCOUNTER — Ambulatory Visit: Payer: Managed Care, Other (non HMO) | Attending: Internal Medicine | Admitting: Internal Medicine

## 2023-06-19 VITALS — BP 138/80 | HR 83 | Ht 69.0 in | Wt 168.0 lb

## 2023-06-19 DIAGNOSIS — E785 Hyperlipidemia, unspecified: Secondary | ICD-10-CM

## 2023-06-19 DIAGNOSIS — M3320 Polymyositis, organ involvement unspecified: Secondary | ICD-10-CM

## 2023-06-19 DIAGNOSIS — I5033 Acute on chronic diastolic (congestive) heart failure: Secondary | ICD-10-CM | POA: Diagnosis not present

## 2023-06-19 DIAGNOSIS — R001 Bradycardia, unspecified: Secondary | ICD-10-CM

## 2023-06-19 DIAGNOSIS — R931 Abnormal findings on diagnostic imaging of heart and coronary circulation: Secondary | ICD-10-CM

## 2023-06-19 NOTE — Patient Instructions (Signed)
Medication Instructions:  NO CHANGES today   Dr. Rennis Golden is considering a PCSK9i -- Repatha or Praluent. This is self-administered once every 14 days.  *If you need a refill on your cardiac medications before your next appointment, please call your pharmacy*   Lab Work: FASTING lab work as soon as able -- NMR lipoprofile, LPa  If you have labs (blood work) drawn today and your tests are completely normal, you will receive your results only by: MyChart Message (if you have MyChart) OR A paper copy in the mail If you have any lab test that is abnormal or we need to change your treatment, we will call you to review the results.   Follow-Up: At [facility], you and your health needs are our priority.  As part of our continuing mission to provide you with exceptional heart care, we have created designated Provider Care Teams.  These Care Teams include your primary Cardiologist (physician) and Advanced Practice Providers (APPs -  Physician Assistants and Nurse Practitioners) who all work together to provide you with the care you need, when you need it.  We recommend signing up for the patient portal called "MyChart".  Sign up information is provided on this After Visit Summary.  MyChart is used to connect with patients for Virtual Visits (Telemedicine).  Patients are able to view lab/test results, encounter notes, upcoming appointments, etc.  Non-urgent messages can be sent to your provider as well.   To learn more about what you can do with MyChart, go to [email or web address].    Your next appointment:   6 months with Dr. Rennis Golden

## 2023-06-19 NOTE — Progress Notes (Signed)
OFFICE NOTE  Chief Complaint:  Routine follow-up  Primary Care Physician: Ralene Ok, MD  HPI:  Stuart Collins is a 57 year old gentleman with a history of rheumatoid arthritis, on methotrexate and Orencia. He also has dyslipidemia, for which we are following. From a cholesterol standpoint, he follows a very low-cholesterol diet; however, does have a history of steatohepatitis and is not currently on a statin, and is taking Zetia and fenofibrate for management of this. From what I could tell, his last lipid profile was in April of 2012 and would bear repeating at this time. Otherwise, he is asymptomatic. When following his lipid profile, we saw that he did have a lower LDL cholesterol when he was exercising more. Recently spent taking care of his mother who had spinal surgery and his fallen off of his exercise routine. He wishes to get back to that, to modify his risk factors for coronary disease.  Stuart Collins returns for followup today and is doing well. His cholesterol maintains good control with an LDL of 95. He is not on a statin due to a history of steatohepatitis.  He is curious as to whether or not he has any coronary artery disease at this point or not.  I saw Stuart Collins back in the office today. Overall he is doing well. He reports his arthritis is well controlled on the Orencia. He's currently on Zetia 10 mg, fenofibrate 160 and Livalo 2 mg daily. Livalo was specifically chosen because of his history of steatohepatitis. There is clear evidence that this medicine is very unlikely to cause liver enzyme abnormalities, therefore there is no generic statin alternative for this medication. He reports his exercise has been less than he would desire. Weight is fairly stable. Blood pressure is well-controlled.  03/15/2016  Stuart Collins is seen today for follow-up. Over the past year he said no new events or complaints. He reports his arthritis is fairly well-controlled. He is on that area,  fenofibrate and atorvastatin for cholesterol. He recently had lab work including a metabolic profile and lipid profile drawn by her primary care provider. I will request those records today.  03/15/2017  Stuart Collins is seen today in follow-up. He reports good control over his rheumatoid arthritis. Recently his rheumatologist retired but he's switched over to see Dr. Dierdre Forth. His dyslipidemia was recently assessed by his primary care provider will obtain those results to review. He is on A statin, ezetimibe and fenofibrate. His goal LDL-C is less than 70 with his history of mid LAD coronary artery calcification seen on calcium scoring. He is asymptomatic and denies any chest pain or worsening shortness of breath. He reports his liver enzymes have been stable. He exercises 2-3 times a week on a treadmill.  02/17/2018  Stuart Collins returns today for routine follow-up.  Overall he is well and denies any chest pain or worsening shortness of breath.  He continues to have some rheumatologic pain.  He is on Orencia.  He takes ezetimibe, fenofibrate and Livalo, and has a history of mild LAD coronary artery calcification.  He occasionally has some kidney stones and seems to have side effects with narcotics.  I mentioned he could consider taking Toradol for the pain.  We do not have recent lab work.  In the past his LDL had been in the low 80s.  He may benefit from a slight increase in his Livalo.  I mentioned that there is a little supportive evidence for over-the-counter fish oil, in fact it  may be harmful in some instances.  02/23/2018  Stuart Collins returns today for follow-up now a few days after his birthday.  Overall he is doing very well.  He again reports good control over his rheumatoid arthritis.  He said cholesterol was just assessed yesterday and those lab results were not available.  He thinks he might have some adverse symptoms related to ezetimibe, reporting that he has had some GI upset which could be caused by  that medication.  Ultimately, we might be able to increase his Livalo further and decrease or stop the ezetimibe to see if his symptoms improve.  03/02/2021  Stuart Collins returns today for annual follow-up.  Overall he is doing well.  Denies any chest pain or shortness of breath.  He recently got a treadmill and intends to start using it.  He is lost a little weight.  His cholesterol looks excellent.  This is been assessed by his PCP.  Last checked about a week ago.  Total cholesterol is 127, triglycerides 123, HDL 36 and LDL of 66.  His LDL is trended downwards from 83-77 is 66.  He is now at target.  Liver enzymes are elevated which has been somewhat persistent.  ALT of 104 and AST 86, however these are less than 3 times upper limit of normal which were 55 and 46 respectively based on this lab.  He also continues to follow with rheumatology.  EKGs from Dr. Rennie Plowman office shows sinus rhythm and here again also shows sinus rhythm.  Blood pressure is well controlled today at 110/62.  07/27/2022  Stuart Collins is seen today in follow-up.  He tells me that recently liver enzymes have gone up somewhat.  He is followed closely at [facility] but does not see a hematologist currently.  Last numbers I saw showed an AST 105 ALT 124.  He is on 3 lipid-lowering agents including Livalo 2 mg daily, fenofibrate 160 mg daily and ezetimibe 10 mg daily.  These medicines have not been adjusted.  He tells me he was taken off of the Uroxatrol.  He has not had repeat lipids recently.  His last labs in June showed total cholesterol 127, triglycerides 136, HDL 37 and LDL 68.  06/19/2023  Stuart Collins is seen today in follow-up.  He was recently seen in follow-up by Bernadene Person, DNP for hospital follow-up of hypervolemia.  This occurred in May 2024.  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  He has been on steroid therapy for this and recently had increases in his azathioprine to try to minimize  the steroid dosing.  Cardiology was consulted and echo showed normal LVEF with mild RV enlargement and some pulmonary hypertension.  It was not thought that this was due to acute heart failure exacerbation.  Follow-up echo in July 2024 showed normal LVEF, mild LVH and normal RV systolic function with moderate left atrial enlargement, mild mitral regurgitation, but normal diastolic function.  Subsequently he went off of daily Lasix and maintains a prescription to use as needed.  He was also added on with olmesartan hand.  He is taking that medication with good control of her blood pressure today 130/80.  His lipids however have been not controlled.  He has been off of statin therapy due to elevated liver enzymes and now with a diagnosis of polymyositis I would be very hesitant to use a statin infected contraindicated.  He is recent lipids in April showed total cholesterol 165,  HDL 25, triglycerides 228 and LDL 101.  He does have underlying coronary artery calcification with a calcium score of 65 involving the LAD back in 2015, but he has not had any recent imaging.  PMHx:  Past Medical History:  Diagnosis Date   Allergic arthritis    Asthma, mild intermittent    followed by dr Elvera Lennox. Irena Cords ([facility])   Coronary artery calcification seen on CAT scan 03/2014   cardiologist--- dr Rennis Golden;  calcium score=65.5 involving LAD   Dyspnea    due to weakness   Elevated liver enzymes    referred to [facility]--- dr Marca Ancona by pcp   Family history of adverse reaction to anesthesia    mother and father--- ponv   Family history of premature CAD    NUCLEAR STRESS TEST, 12/30/2002 - no evidence of ischemia   GERD (gastroesophageal reflux disease)    Hepatic steatosis determined by biopsy of liver 2003   History of basal cell carcinoma (BCC) excision 08/18/2014   12/ 2018 nodular right chest tx; cx3 53fu;   03/ 2023  right breast (curet and 5FU)   History of kidney stones    urologist--- dr  Mena Goes   Hyperlipidemia, mixed    followed by dr Jo Cerone   Muscle weakness (generalized)    02-05-2023  per pt using cane   NASH (nonalcoholic steatohepatitis)    followed by dr Marca Ancona  (GI)   Nocturia    RA (rheumatoid arthritis) [facility])    rheumatologist--- dr Dierdre Forth;  multiple sites   Raynaud's disease    Sciatica, right side    Wears glasses     Past Surgical History:  Procedure Laterality Date   CYSTOSCOPY W/ URETERAL STENT PLACEMENT  12/02/2011   Procedure: CYSTOSCOPY WITH RETROGRADE PYELOGRAM/URETERAL STENT PLACEMENT;  Surgeon: Kathi Ludwig, MD;  Location: [facility];  Service: Urology;  Laterality: Right;   CYSTOSCOPY W/ URETERAL STENT PLACEMENT  12/15/2011   Procedure: CYSTOSCOPY WITH STENT REPLACEMENT;  Surgeon: Milford Cage, MD;  Location: [facility];  Service: Urology;  Laterality: Right;  right reter stent removal and stent placement   CYSTOSCOPY/RETROGRADE/URETEROSCOPY  12/15/2011   Procedure: CYSTOSCOPY/RETROGRADE/URETEROSCOPY;  Surgeon: Milford Cage, MD;  Location: [facility];  Service: Urology;  Laterality: Right;  no retrograde   LIVER BIOPSY  05/29/2002   MUSCLE BIOPSY Left 02/15/2023   Procedure: LEFT THIGH MUSCLE BIOPSY;  Surgeon: Fritzi Mandes, MD;  Location: [facility];  Service: General;  Laterality: Left;   ORCHIOPEXY  1970   TONSILLECTOMY  1970   UPPER GI ENDOSCOPY  03/17/2002    FAMHx:  Family History  Problem Relation Age of Onset   Kidney Stones Mother    Arthritis Mother 58       Osteo   Heart disease Father 52   Heart attack Maternal Grandfather 79   Hyperlipidemia Brother     SOCHx:   reports that he quit smoking about 44 years ago. His smoking use included cigarettes. He started smoking about 48 years ago. He has never used smokeless tobacco. He reports that he does not currently use alcohol. He reports that he does not use drugs.  ALLERGIES:  Allergies  Allergen Reactions   Mushroom Extract Complex  Shortness Of Breath and Other (See Comments)    All types mushrooms   Other Shortness Of Breath and Other (See Comments)    "ALL NUTS"   Peanut-Containing Drug Products Shortness Of Breath   Shellfish Allergy Hives,  Shortness Of Breath and Other (See Comments)    All shellfish   Tape Rash and Other (See Comments)    The CLEAR, PLASTIC tape used in the hospital BREAKS OUT THE SKIN!!   Morphine Other (See Comments)     "makes my head feel like its stuffed; like it's going to pop"   Codeine Other (See Comments)     "makes my head feel like its stuffed; like it's going to pop"    ROS: Pertinent items noted in HPI and remainder of comprehensive ROS otherwise negative.  HOME MEDS: Current Outpatient Medications  Medication Sig Dispense Refill   albuterol (VENTOLIN HFA) 108 (90 Base) MCG/ACT inhaler Inhale 1-2 puffs into the lungs every 4 (four) hours as needed for wheezing or shortness of breath.     AUVI-Q 0.3 MG/0.3ML SOAJ injection Inject 0.3 mg into the muscle as needed for anaphylaxis.     azaTHIOprine (IMURAN) 50 MG tablet Take 50 mg by mouth daily.     azelastine (ASTELIN) 137 MCG/SPRAY nasal spray Place 2 sprays into the nose 2 (two) times daily.     BEPREVE 1.5 % SOLN Place 1 drop into both eyes 2 (two) times daily as needed for allergies.  5   cetirizine (ZYRTEC) 10 MG tablet Take 10 mg by mouth at bedtime.      fluticasone (FLONASE) 50 MCG/ACT nasal spray Place 2 sprays into the nose in the morning.     lansoprazole (PREVACID) 30 MG capsule Take 30 mg by mouth 2 (two) times daily before a meal.     montelukast (SINGULAIR) 10 MG tablet Take 10 mg by mouth at bedtime.     Multiple Vitamin (MULITIVITAMIN WITH MINERALS) TABS Take 1 tablet by mouth daily with breakfast.     olmesartan (BENICAR) 20 MG tablet Take 1 tablet (20 mg total) by mouth daily. 90 tablet 3   ORENCIA 125 MG/ML SOSY Inject 125 mg into the skin every Sunday.     Potassium Citrate 15 MEQ (1620 MG) TBCR Take 15 mEq  by mouth daily.     predniSONE (DELTASONE) 10 MG tablet Take 10 mg by mouth daily with breakfast.     ramelteon (ROZEREM) 8 MG tablet Take 8 mg by mouth at bedtime as needed for sleep.     sucralfate (CARAFATE) 1 g tablet Take 1 g by mouth 2 (two) times daily.     triamcinolone cream (KENALOG) 0.1 % Apply topically daily. (Patient taking differently: Apply 1 Application topically daily as needed (for itching).) 454 g 6   furosemide (LASIX) 20 MG tablet Take 1 tablet (20 mg total) by mouth daily. (Patient not taking: Reported on 06/19/2023) 90 tablet 3   No current facility-administered medications for this visit.    LABS/IMAGING: No results found for this or any previous visit (from the past 48 hour(s)). No results found.  VITALS: Pulse 83   Ht 5\' 9"  (1.753 m)   Wt 168 lb (76.2 kg)   BMI 24.81 kg/m   EXAM: General appearance: alert and no distress Neck: no carotid bruit, no JVD and thyroid not enlarged, symmetric, no tenderness/mass/nodules Lungs: clear to auscultation bilaterally Heart: regular rate and rhythm, S1, S2 normal, no murmur, click, rub or gallop Abdomen: soft, non-tender; bowel sounds normal; no masses,  no organomegaly Extremities: extremities normal, atraumatic, no cyanosis or edema Pulses: 2+ and symmetric Skin: Skin color, texture, turgor normal. No rashes or lesions Neurologic: Grossly normal Psych: Pleasant  EKG: EKG Interpretation Date/Time:  Wednesday  June 19 2023 12:04:41 EDT Ventricular Rate:  83 PR Interval:  152 QRS Duration:  86 QT Interval:  346 QTC Calculation: 406 R Axis:   8  Text Interpretation: Normal sinus rhythm Normal ECG Compared to previous tracing there has been no change Confirmed by Zoila Shutter [phone]) on 06/19/2023 12:19:39 PM    ASSESSMENT: Rheumatoid arthritis Recently diagnosed polymyositis Hospitalization for hypervolemia (01/2023) Dyslipidemia Elevated liver function tests - steatohepatitis Coronary artery  disease-mid LAD coronary calcium (CAC 65 - 2015)  PLAN: 1.   Mr. Krutz was hospitalized this year for volume overload but felt to have noncardiac pulmonary edema.  He was diuresed and repeat echo showed normal systolic and diastolic function.  He does have some moderate left atrial enlargement with mild LVH and mild MR.  He has not been on statin therapy due to elevated liver enzymes which are persistent and now with his polymyositis, I would be very hesitant to use a statin.  This would actually be contraindicated.  The only option I think would be possibly tolerated would be a PCSK9 inhibitor.  I would recommend we reassess his lipids including an LP(a) and consider reaching out for prior authorization for this.  He may need updated coronary evaluation at some point.  His calcium score was elevated back in 2015 although that is close to 10 years ago.  Plan follow-up with me in about 6 months or sooner as necessary.  Chrystie Nose, MD, [facility], FACP  [place]  Medical Director of the [facility]  Cardiovascular Risk Reduction Clinic Diplomate of the American Board of Clinical Lipidology Attending Cardiologist  Direct Dial: [phone]  Fax: [phone]  Website:  [email or web address] Angelin Cutrone 06/19/2023, 12:19 PM

## 2023-06-25 LAB — NMR, LIPOPROFILE
Cholesterol, Total: 265 mg/dL — ABNORMAL HIGH (ref 100–199)
HDL Particle Number: 22.4 umol/L — ABNORMAL LOW (ref >=30.5)
HDL-C: 39 mg/dL — ABNORMAL LOW (ref >39)
LDL Particle Number: 2465 nmol/L — ABNORMAL HIGH (ref <1000)
LDL Size: 20.9 nmol (ref >20.5)
LDL-C (NIH Calc): 169 mg/dL — ABNORMAL HIGH (ref 0–99)
LP-IR Score: 60 — ABNORMAL HIGH (ref <=45)
Small LDL Particle Number: 1232 nmol/L — ABNORMAL HIGH (ref <=527)
Triglycerides: 300 mg/dL — ABNORMAL HIGH (ref 0–149)

## 2023-06-25 LAB — LIPOPROTEIN A (LPA): Lipoprotein (a): 29.2 nmol/L (ref <75.0)

## 2023-06-27 ENCOUNTER — Other Ambulatory Visit: Payer: Self-pay | Admitting: Urology

## 2023-06-28 ENCOUNTER — Telehealth: Payer: Self-pay | Admitting: Pharmacy Technician

## 2023-06-28 ENCOUNTER — Other Ambulatory Visit (HOSPITAL_COMMUNITY): Payer: Self-pay

## 2023-06-28 DIAGNOSIS — E785 Hyperlipidemia, unspecified: Secondary | ICD-10-CM

## 2023-06-28 IMAGING — US US ABDOMEN COMPLETE W/ ELASTOGRAPHY
1 series · 12 of 25 positions shown · non-contrast
Comparison: MRI November 21, 2016

CLINICAL DATA: Hepatic steatosis

EXAM:
ULTRASOUND ABDOMEN
ULTRASOUND HEPATIC ELASTOGRAPHY
TECHNIQUE: Sonography of the upper abdomen was performed. In addition,
ultrasound elastography evaluation of the liver was performed. A
region of interest was placed within the right lobe of the liver.
Following application of a compressive sonographic pulse, tissue
compressibility was assessed. Multiple assessments were performed at
the selected site. Median tissue compressibility was determined.
Previously, hepatic stiffness was assessed by shear wave velocity.
Based on recently published Society of Radiologists in Ultrasound
consensus article, reporting is now recommended to be performed in
the SI units of pressure (kiloPascals) representing hepatic
stiffness/elasticity. The obtained result is compared to the
published reference standards. (cACLD = compensated Advanced Chronic
Liver Disease)

[Series 1: us abdomen complete w/ elastography · 0.14mm/px · 12 of 86 slices shown]
[im 4/86]
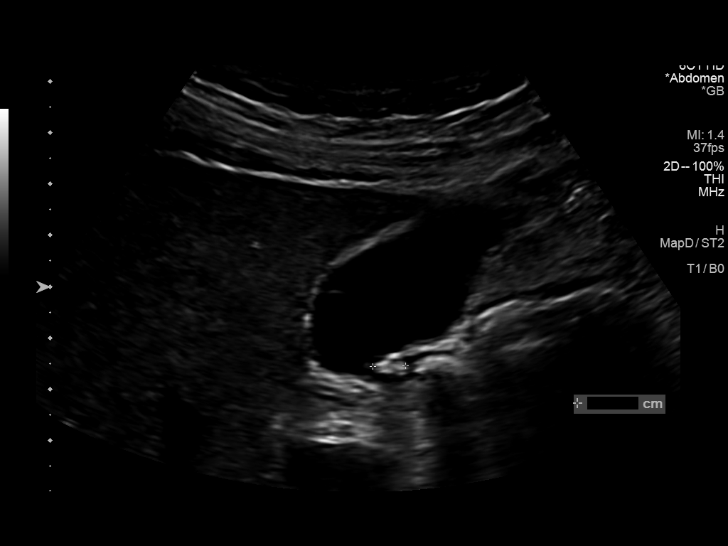
[im 11/86]
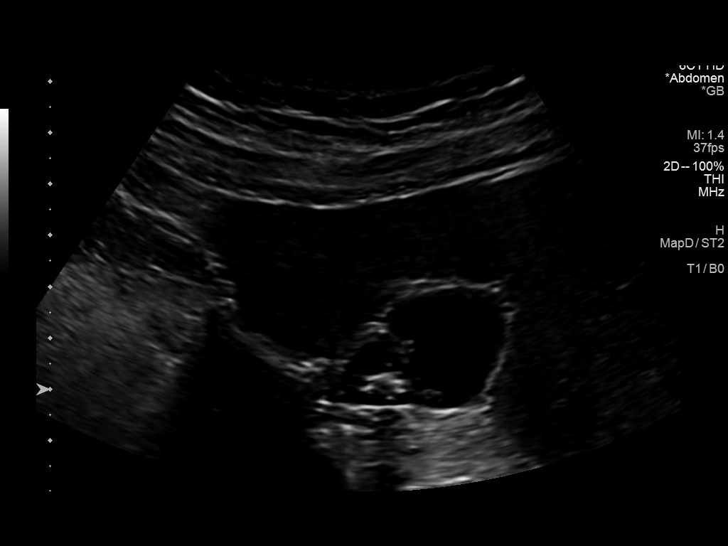
[im 18/86]
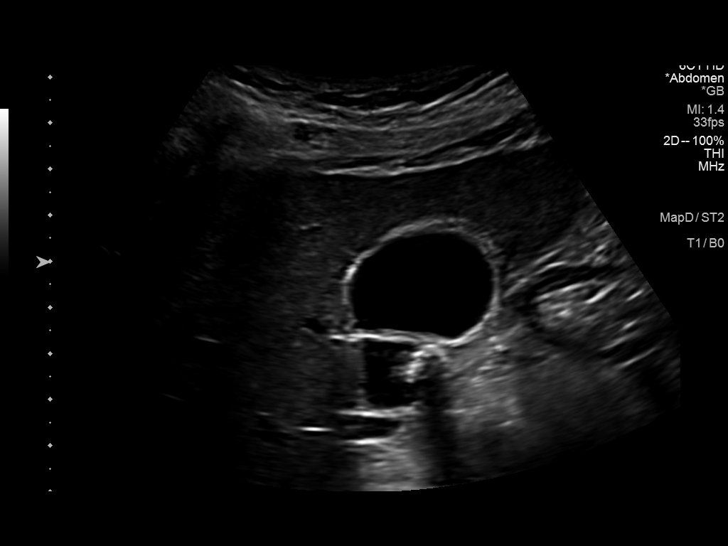
[im 25/86]
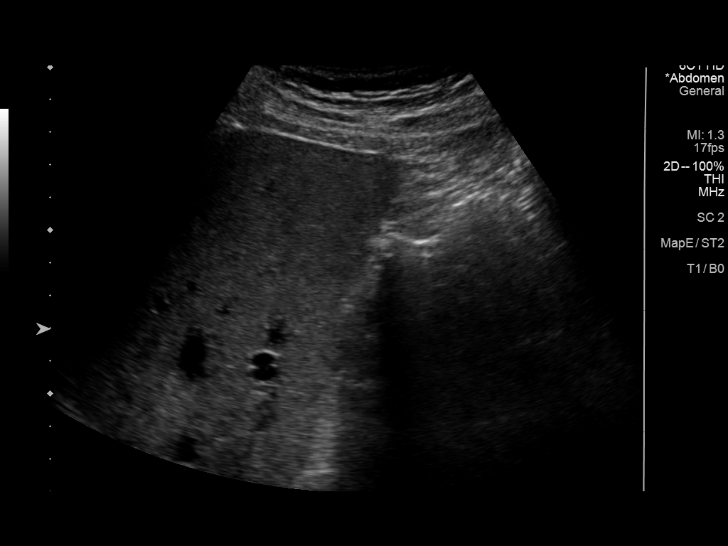
[im 32/86]
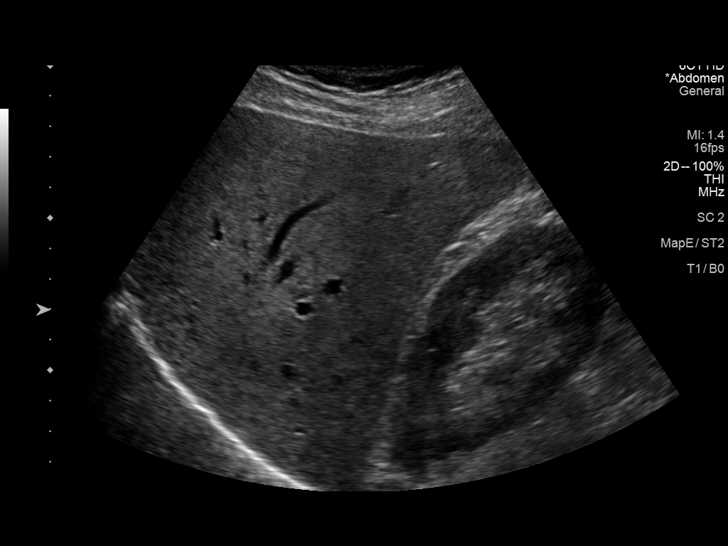
[im 39/86]
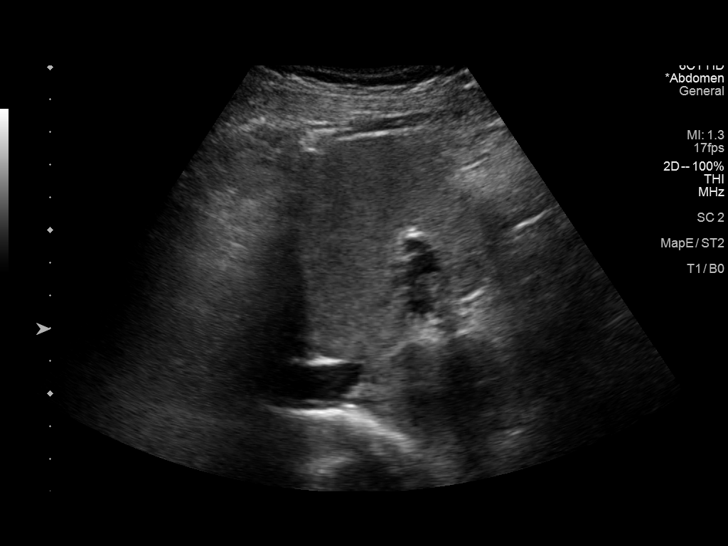
[im 47/86]
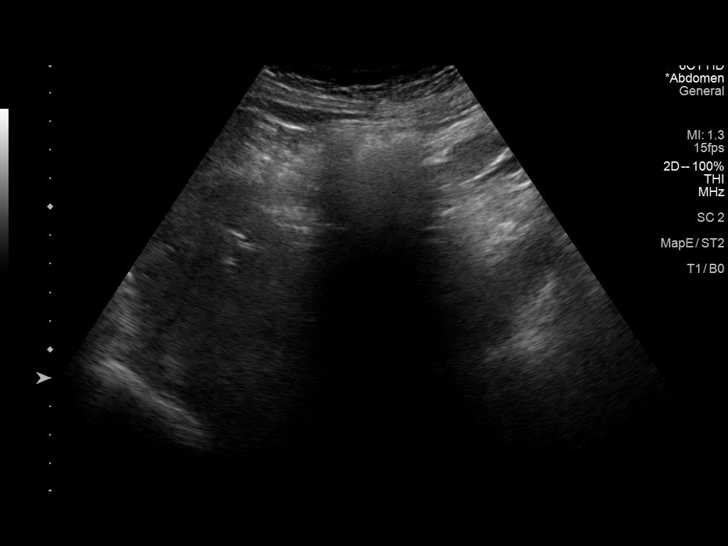
[im 54/86]
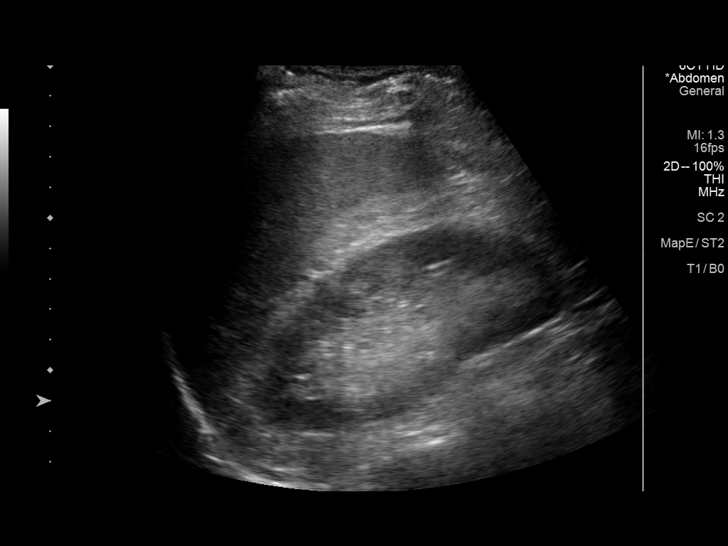
[im 61/86]
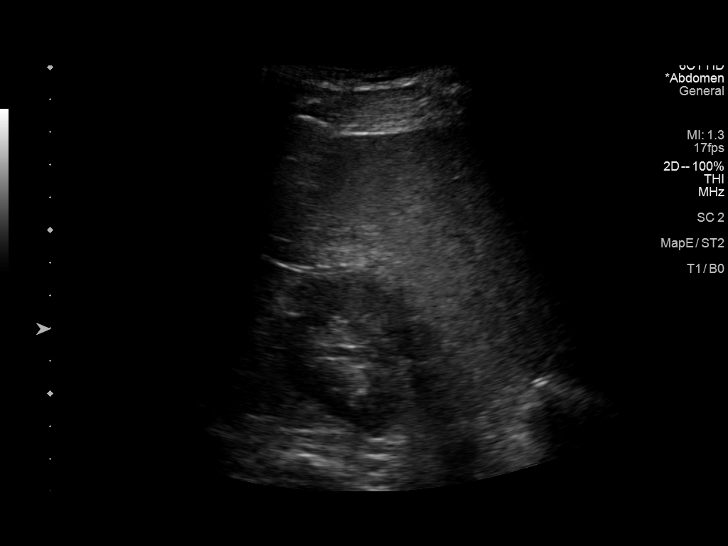
[im 68/86]
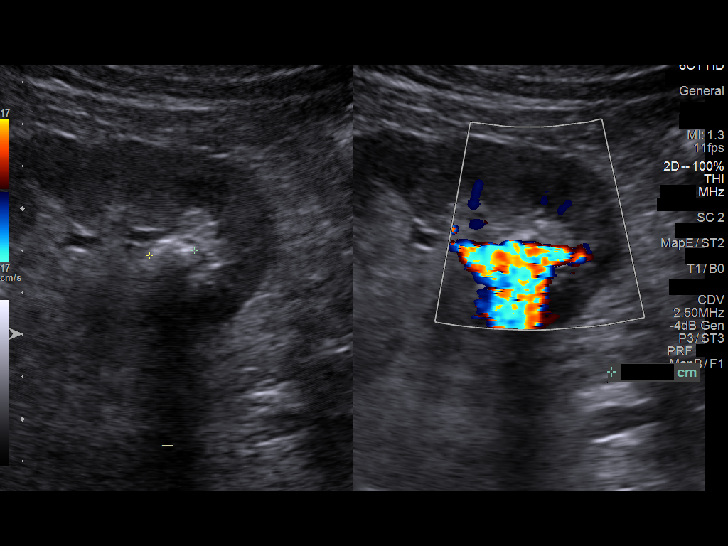
[im 75/86]
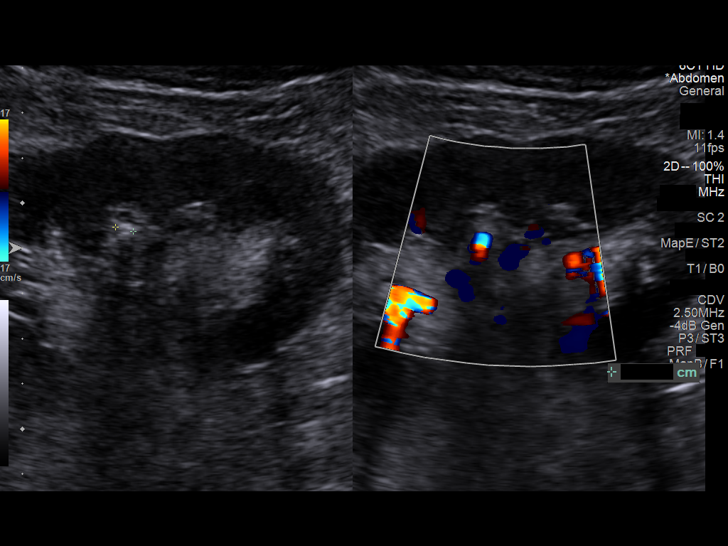
[im 82/86]
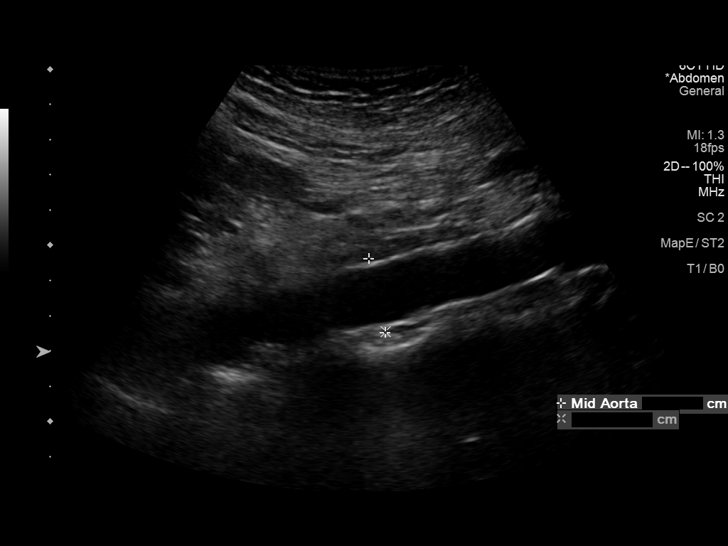

[12 of 25 positions shown; findings below may reference images not displayed]

FINDINGS: ULTRASOUND ABDOMEN

Gallbladder: Cholelithiasis measuring up to 1 cm. No pericholecystic
fluid or wall thickening visualized. No sonographic Murphy sign
noted by sonographer.

Common bile duct: Diameter: 3 mm

Liver: No focal lesion identified. Heterogeneously increased
parenchymal echogenicity. Portal vein is patent on color Doppler
imaging with normal direction of blood flow towards the liver.

IVC: No abnormality visualized.

Pancreas: Visualized portion unremarkable.

Spleen: Size and appearance within normal limits.

Right Kidney: Length: 11.3 cm. Echogenicity within normal limits. No
mass or hydronephrosis visualized.

Left Kidney: Length: 10.9 cm. Echogenicity within normal limits.
Nonobstructive renal stones measuring up to 1.1 cm. Exophytic left
lower pole renal cyst measuring 1.8 cm. No mass or hydronephrosis
visualized.

Abdominal aorta: No aneurysm visualized.

Other findings: None.

ULTRASOUND HEPATIC ELASTOGRAPHY

Device: Siemens Helix VTQ

Patient position: Oblique

Transducer [id number]

Number of measurements: 10

Hepatic segment:  8

Median kPa:

IQR:

IQR/Median kPa ratio:

Data quality:  Goes

Diagnostic category: < or = 9 kPa: in the absence of other known
clinical signs, rules out cACLD

The use of hepatic elastography is applicable to patients with viral
hepatitis and non-alcoholic fatty liver disease. At this time, there
is insufficient data for the referenced cut-off values and use in
other causes of liver disease, including alcoholic liver disease.
Patients, however, may be assessed by elastography and serve as
their own reference standard/baseline.

In patients with non-alcoholic liver disease, the values suggesting
compensated advanced chronic liver disease (cACLD) may be lower, and
patients may need additional testing with elasticity results of [DATE]
kPa.

Please note that abnormal hepatic elasticity and shear wave
velocities may also be identified in clinical settings other than
with hepatic fibrosis, such as: acute hepatitis, elevated right
heart and central venous pressures including use of beta blockers,
Cooper disease (J Pando), infiltrative processes such as
mastocytosis/amyloidosis/infiltrative tumor/lymphoma, extrahepatic
cholestasis, with hyperemia in the post-prandial state, and with
liver transplantation. Correlation with patient history, laboratory
data, and clinical condition recommended.

Diagnostic Categories:

< or =5 kPa: high probability of being normal

< or =9 kPa: in the absence of other known clinical signs, rules [DATE] kPa and ?13 kPa: suggestive of cACLD, but needs further testing

>13 kPa: highly suggestive of cACLD

> or =17 kPa: highly suggestive of cACLD with an increased
probability of clinically significant portal hypertension
IMPRESSION: ULTRASOUND ABDOMEN:

Hepatic steatosis without discrete focal hepatic lesion visualized.

Cholelithiasis without findings of acute cholecystitis.

ULTRASOUND HEPATIC ELASTOGRAPHY:

Median kPa:

Diagnostic category: < or = 9 kPa: in the absence of other known
clinical signs, rules out cACLD

## 2023-06-28 NOTE — Telephone Encounter (Signed)
-----   Message from Nurse Eileen Stanford E sent at 06/27/2023  1:45 PM EDT ----- Regarding: PCSK9 PA Hey team!  This patient needs a PA for Repatha Sureclick 140 or Praluent 150 -- depending on insurance.   Dx:  Polymyositis Elevated LFTs - on Livalo Coronary calcium score of 65.5. This was 75th percentile for age and sex matched control

## 2023-06-28 NOTE — Telephone Encounter (Signed)
Pharmacy Patient Advocate Encounter  Insurance verification completed.    The patient is insured through [employer] test claim for repatha. Currently a quantity of 2 ml is a 28 day supply and the co-pay is $0.00-no charge .   This test claim was processed through [facility]- copay amounts may vary at other pharmacies due to pharmacy/plan contracts, or as the patient moves through the different stages of their insurance plan.

## 2023-07-01 MED ORDER — REPATHA SURECLICK 140 MG/ML ~~LOC~~ SOAJ: 1.0000 mL | SUBCUTANEOUS | 3 refills | Status: DC

## 2023-07-01 NOTE — Telephone Encounter (Signed)
Update sent to patient in MyChart

## 2023-07-01 NOTE — Addendum Note (Signed)
Addended by: Lindell Spar on: 07/01/2023 09:36 AM   Modules accepted: Orders

## 2023-07-01 NOTE — Telephone Encounter (Signed)
Rx(s) sent to pharmacy electronically. NMR order mailed - to be done early 2025

## 2023-08-07 NOTE — Progress Notes (Addendum)
COVID Vaccine Completed: yes  Date of COVID positive in last 90 days: no  PCP - Ralene Ok, MD Cardiologist - Zoila Shutter, MD LOV 06/19/23  CT- 02/19/23 Epic Chest x-ray - 02/19/23 [facility] EKG - 06/19/23  Epic Stress Test - 2004 ECHO - 03/26/23 Epic Cardiac Cath - n/a Pacemaker/ICD device last checked: n/a Spinal Cord Stimulator: n/a  Bowel Prep - no  Sleep Study - n/a CPAP -   Fasting Blood Sugar - n/a Checks Blood Sugar _____ times a day  Last dose of GLP1 agonist-  N/A GLP1 instructions:  Hold 7 days before surgery    Last dose of SGLT-2 inhibitors-  N/A SGLT-2 instructions:  Hold 3 days before surgery    Blood Thinner Instructions:  Time Aspirin Instructions: ASA 81, hold 5 days Last Dose:  Activity level: Can go up a flight of stairs and perform activities of daily living without stopping and without symptoms of chest pain or shortness of breath.   Anesthesia review: CHF, anemia, raynauds, coronary artery calcification, AST 114 and ALT 122  Patient denies shortness of breath, fever, cough and chest pain at PAT appointment  Patient verbalized understanding of instructions that were given to them at the PAT appointment. Patient was also instructed that they will need to review over the PAT instructions again at home before surgery.

## 2023-08-07 NOTE — Patient Instructions (Addendum)
SURGICAL WAITING ROOM VISITATION  Patients having surgery or a procedure may have no more than 2 support people in the waiting area - these visitors may rotate.    Children under the age of 57 must have an adult with them who is not the patient.  Due to an increase in RSV and influenza rates and associated hospitalizations, children ages 52 and under may not visit patients in [facility] hospitals.  If the patient needs to stay at the hospital during part of their recovery, the visitor guidelines for inpatient rooms apply. Pre-op nurse will coordinate an appropriate time for 1 support person to accompany patient in pre-op.  This support person may not rotate.    Please refer to the [facility] website for the visitor guidelines for Inpatients (after your surgery is over and you are in a regular room).    Your procedure is scheduled on: 08/30/23   Report to [facility] Main Entrance    Report to admitting at 5:15 AM   Call this number if you have problems the morning of surgery [phone]   Do not eat food or drink liquids :After Midnight.          If you have questions, please contact your surgeon's office.   FOLLOW BOWEL PREP AND ANY ADDITIONAL PRE OP INSTRUCTIONS YOU RECEIVED FROM YOUR SURGEON'S OFFICE!!!     Oral Hygiene is also important to reduce your risk of infection.                                    Remember - BRUSH YOUR TEETH THE MORNING OF SURGERY WITH YOUR REGULAR TOOTHPASTE  DENTURES WILL BE REMOVED PRIOR TO SURGERY PLEASE DO NOT APPLY "Poly grip" OR ADHESIVES!!!   Stop all vitamins and herbal supplements 7 days before surgery.   Take these medicines the morning of surgery with A SIP OF WATER: Albuterol ,Prevacid, Flonase, Eye drops              You may not have any metal on your body including jewelry, and body piercing             Do not wear lotions, powders, cologne, or deodorant              Men may shave face and neck.   Do not bring valuables  to the hospital. [facility] IS NOT             RESPONSIBLE   FOR VALUABLES.   Contacts, glasses, dentures or bridgework may not be worn into surgery.  DO NOT BRING YOUR HOME MEDICATIONS TO THE HOSPITAL. PHARMACY WILL DISPENSE MEDICATIONS LISTED ON YOUR MEDICATION LIST TO YOU DURING YOUR ADMISSION IN THE HOSPITAL!    Patients discharged on the day of surgery will not be allowed to drive home.  Someone NEEDS to stay with you for the first 24 hours after anesthesia.   Special Instructions: Bring a copy of your healthcare power of attorney and living will documents the day of surgery if you haven't scanned them before.              Please read over the following fact sheets you were given: IF YOU HAVE QUESTIONS ABOUT YOUR PRE-OP INSTRUCTIONS PLEASE CALL [phone] Contras    If you received a COVID test during your pre-op visit  it is requested that you wear a mask when out in public, stay  away from anyone that may not be feeling well and notify your surgeon if you develop symptoms. If you test positive for Covid or have been in contact with anyone that has tested positive in the last 10 days please notify you surgeon.    [facility] - Preparing for Surgery Before surgery, you can play an important role.  Because skin is not sterile, your skin needs to be as free of germs as possible.  You can reduce the number of germs on your skin by washing with CHG (chlorahexidine gluconate) soap before surgery.  CHG is an antiseptic cleaner which kills germs and bonds with the skin to continue killing germs even after washing. Please DO NOT use if you have an allergy to CHG or antibacterial soaps.  If your skin becomes reddened/irritated stop using the CHG and inform your nurse when you arrive at Short Stay. Do not shave (including legs and underarms) for at least 48 hours prior to the first CHG shower.  You may shave your face/neck.  Please follow these instructions carefully:  1.  Shower with CHG Soap  the night before surgery and the  morning of surgery.  2.  If you choose to wash your hair, wash your hair first as usual with your normal  shampoo.  3.  After you shampoo, rinse your hair and body thoroughly to remove the shampoo.                             4.  Use CHG as you would any other liquid soap.  You can apply chg directly to the skin and wash.  Gently with a scrungie or clean washcloth.  5.  Apply the CHG Soap to your body ONLY FROM THE NECK DOWN.   Do   not use on face/ open                           Wound or open sores. Avoid contact with eyes, ears mouth and   genitals (private parts).                       Wash face,  Genitals (private parts) with your normal soap.             6.  Wash thoroughly, paying special attention to the area where your    surgery  will be performed.  7.  Thoroughly rinse your body with warm water from the neck down.  8.  DO NOT shower/wash with your normal soap after using and rinsing off the CHG Soap.                9.  Pat yourself dry with a clean towel.            10.  Wear clean pajamas.            11.  Place clean sheets on your bed the night of your first shower and do not  sleep with pets. Day of Surgery : Do not apply any lotions/deodorants the morning of surgery.  Please wear clean clothes to the hospital/surgery center.  FAILURE TO FOLLOW THESE INSTRUCTIONS MAY RESULT IN THE CANCELLATION OF YOUR SURGERY  PATIENT SIGNATURE_________________________________  NURSE SIGNATURE__________________________________  ________________________________________________________________________

## 2023-08-09 ENCOUNTER — Encounter (HOSPITAL_COMMUNITY)
Admission: RE | Admit: 2023-08-09 | Discharge: 2023-08-09 | Disposition: A | Discharge status: Home / Self Care | Payer: Managed Care, Other (non HMO) | Source: Ambulatory Visit | Attending: Urology | Admitting: Urology

## 2023-08-09 ENCOUNTER — Other Ambulatory Visit: Payer: Self-pay

## 2023-08-09 ENCOUNTER — Encounter (HOSPITAL_COMMUNITY): Payer: Self-pay

## 2023-08-09 VITALS — BP 128/87 | HR 78 | Temp 97.7°F | Resp 16 | Ht 69.0 in | Wt 168.0 lb

## 2023-08-09 DIAGNOSIS — I5033 Acute on chronic diastolic (congestive) heart failure: Secondary | ICD-10-CM | POA: Diagnosis not present

## 2023-08-09 DIAGNOSIS — M069 Rheumatoid arthritis, unspecified: Secondary | ICD-10-CM | POA: Insufficient documentation

## 2023-08-09 DIAGNOSIS — I2510 Atherosclerotic heart disease of native coronary artery without angina pectoris: Secondary | ICD-10-CM | POA: Insufficient documentation

## 2023-08-09 DIAGNOSIS — K219 Gastro-esophageal reflux disease without esophagitis: Secondary | ICD-10-CM | POA: Diagnosis not present

## 2023-08-09 DIAGNOSIS — K7581 Nonalcoholic steatohepatitis (NASH): Secondary | ICD-10-CM | POA: Insufficient documentation

## 2023-08-09 DIAGNOSIS — Z01812 Encounter for preprocedural laboratory examination: Secondary | ICD-10-CM | POA: Insufficient documentation

## 2023-08-09 DIAGNOSIS — R7401 Elevation of levels of liver transaminase levels: Secondary | ICD-10-CM | POA: Diagnosis not present

## 2023-08-09 DIAGNOSIS — Z87891 Personal history of nicotine dependence: Secondary | ICD-10-CM | POA: Diagnosis not present

## 2023-08-09 DIAGNOSIS — J4520 Mild intermittent asthma, uncomplicated: Secondary | ICD-10-CM | POA: Diagnosis not present

## 2023-08-09 HISTORY — DX: Heart failure, unspecified: I50.9

## 2023-08-09 LAB — CBC
HCT: 40.8 % (ref 39.0–52.0)
Hemoglobin: 13 g/dL (ref 13.0–17.0)
MCH: 31.6 pg (ref 26.0–34.0)
MCHC: 31.9 g/dL (ref 30.0–36.0)
MCV: 99.3 fL (ref 80.0–100.0)
Platelets: 205 10*3/uL (ref 150–400)
RBC: 4.11 MIL/uL — ABNORMAL LOW (ref 4.22–5.81)
RDW: 15 % (ref 11.5–15.5)
WBC: 5.8 10*3/uL (ref 4.0–10.5)
nRBC: 0 % (ref 0.0–0.2)

## 2023-08-09 LAB — COMPREHENSIVE METABOLIC PANEL
ALT: 102 U/L — ABNORMAL HIGH (ref 0–44)
AST: 114 U/L — ABNORMAL HIGH (ref 15–41)
Albumin: 3.5 g/dL (ref 3.5–5.0)
Alkaline Phosphatase: 60 U/L (ref 38–126)
Anion gap: 8 (ref 5–15)
BUN: 14 mg/dL (ref 6–20)
CO2: 26 mmol/L (ref 22–32)
Calcium: 9.5 mg/dL (ref 8.9–10.3)
Chloride: 105 mmol/L (ref 98–111)
Creatinine, Ser: 0.58 mg/dL — ABNORMAL LOW (ref 0.61–1.24)
GFR, Estimated: >60 mL/min (ref >60)
Glucose, Bld: 86 mg/dL (ref 70–99)
Potassium: 3.7 mmol/L (ref 3.5–5.1)
Sodium: 139 mmol/L (ref 135–145)
Total Bilirubin: 0.8 mg/dL (ref <1.2)
Total Protein: 6.5 g/dL (ref 6.5–8.1)

## 2023-08-09 NOTE — Progress Notes (Signed)
AST 114 and ALT 122, results routed to Dr. Mena Goes

## 2023-08-12 ENCOUNTER — Encounter (HOSPITAL_COMMUNITY): Payer: Self-pay

## 2023-08-12 NOTE — Progress Notes (Signed)
DISCUSSION: Stuart Collins is a 57 yo male who presents to PAT prior to CYSTOSCOPY LEFT RETROGRADE PYELOGRAM URETEROSCOPY/HOLMIUM LASER/STENT PLACEMENT on 08/30/23 with Dr. Mena Goes. PMH of former smoking, mild CAD by CT, HLD, asthma, allergies, GERD, hepatic steatosis, kidney stones, RA, polymyositis.  Patient follows with Cardiology for hyperlipidemia and mild CAD (by CT). He was last seen in clinic on 06/19/23 and noted to be doing well but can no longer be on a statin due to transaminitis  Pt was admitted for shortness of breath from 6/4-6/6 which was thought to be due to volume overload due to noncardiac pulmonary edema. Cardiology was consulted while inpatient. Echo done while inpatient showed normal LV/RV function. Follow-up echo in July 2024 showed normal LVEF, mild LVH and normal RV systolic function with moderate left atrial enlargement, mild mitral regurgitation, but normal diastolic function. Subsequently he went off of daily Lasix and maintains a prescription to use as needed.    Patient follows with Rheumatology for polymyositis. Last seen on 10/1. He is on chronic prednisone and also takes azathioprine. At last office visit it was felt that he was clinically improving.  Patient has known transaminitis as well. Recommendation from Rheumatology is to avoid Tylenol. He has been referred to GI in the past and had a liver biopsy.   VS: BP 128/87   Pulse 78   Temp 36.5 C (Oral)   Resp 16   Ht 5\' 9"  (1.753 m)   Wt 76.2 kg   SpO2 100%   BMI 24.81 kg/m   PROVIDERS: Ralene Ok, MD Cardiology: Zoila Shutter, MD   LABS: Labs reviewed: Acceptable for surgery. Chronic transaminitis (all labs ordered are listed, but only abnormal results are displayed)  Labs Reviewed  COMPREHENSIVE METABOLIC PANEL - Abnormal; Notable for the following components:      Result Value   Creatinine, Ser 0.58 (*)    AST 114 (*)    ALT 102 (*)    All other components within normal limits  CBC - Abnormal;  Notable for the following components:   RBC 4.11 (*)    All other components within normal limits     IMAGES:  CTA Chest 02/19/23:  IMPRESSION: No evidence of pulmonary embolus.   Small bilateral pleural effusions. Bibasilar atelectasis or infiltrates.   Coronary artery disease.   Cholelithiasis.   EKG 06/19/23:  NSR, rate 83   CV: Echo  03/26/23:  IMPRESSIONS    1. Left ventricular ejection fraction, by estimation, is 60 to 65%. The left ventricle has normal function. The left ventricle has no regional wall motion abnormalities. There is mild concentric left ventricular hypertrophy. Left ventricular diastolic parameters were normal.  2. Right ventricular systolic function is normal. The right ventricular size is normal. There is mildly elevated pulmonary artery systolic pressure.  3. Left atrial size was moderately dilated.  4. The mitral valve is normal in structure. Mild mitral valve regurgitation. No evidence of mitral stenosis.  5. The aortic valve is tricuspid. Aortic valve regurgitation is not visualized. No aortic stenosis is present.  6. The inferior vena cava is normal in size with greater than 50% respiratory variability, suggesting right atrial pressure of 3 mmHg.  CT Coronary Calcium score 03/31/2014   IMPRESSION: Coronary calcium score of 65.5 . This was 75th percentile for age and sex matched control.   Past Medical History:  Diagnosis Date   Allergic arthritis    Asthma, mild intermittent    followed by dr Elvera Lennox. Irena Cords ([place] [facility])   CHF (congestive heart failure) (HCC)    Coronary artery calcification seen on CAT scan 03/2014   cardiologist--- dr Rennis Golden;  calcium score=65.5 involving LAD   Dyspnea    due to weakness   Elevated liver enzymes    referred to [facility] GI--- dr Marca Ancona by pcp   Family history of adverse reaction to anesthesia    mother and father--- ponv   Family history of premature CAD    NUCLEAR STRESS TEST,  12/30/2002 - no evidence of ischemia   GERD (gastroesophageal reflux disease)    Hepatic steatosis determined by biopsy of liver 2003   History of basal cell carcinoma (BCC) excision 08/18/2014   12/ 2018 nodular right chest tx; cx3 65fu;   03/ 2023  right breast (curet and 5FU)   History of kidney stones    urologist--- dr Mena Goes   Hyperlipidemia, mixed    followed by dr hilty   Muscle weakness (generalized)    02-05-2023  per pt using cane   NASH (nonalcoholic steatohepatitis)    followed by dr Marca Ancona  (GI)   Nocturia    RA (rheumatoid arthritis) [facility])    rheumatologist--- dr Dierdre Forth;  multiple sites   Raynaud's disease    Sciatica, right side    Wears glasses     Past Surgical History:  Procedure Laterality Date   COLONOSCOPY     CYSTOSCOPY W/ URETERAL STENT PLACEMENT  12/02/2011   Procedure: CYSTOSCOPY WITH RETROGRADE PYELOGRAM/URETERAL STENT PLACEMENT;  Surgeon: Kathi Ludwig, MD;  Location: [facility];  Service: Urology;  Laterality: Right;   CYSTOSCOPY W/ URETERAL STENT PLACEMENT  12/15/2011   Procedure: CYSTOSCOPY WITH STENT REPLACEMENT;  Surgeon: Milford Cage, MD;  Location: [facility];  Service: Urology;  Laterality: Right;  right reter stent removal and stent placement   CYSTOSCOPY/RETROGRADE/URETEROSCOPY  12/15/2011   Procedure: CYSTOSCOPY/RETROGRADE/URETEROSCOPY;  Surgeon: Milford Cage, MD;  Location: [facility];  Service: Urology;  Laterality: Right;  no retrograde   LIVER BIOPSY  05/29/2002   MUSCLE BIOPSY Left 02/15/2023   Procedure: LEFT THIGH MUSCLE BIOPSY;  Surgeon: Fritzi Mandes, MD;  Location: [facility];  Service: General;  Laterality: Left;   ORCHIOPEXY  1970   TONSILLECTOMY  1970   UPPER GI ENDOSCOPY  03/17/2002    MEDICATIONS:  albuterol (VENTOLIN HFA) 108 (90 Base) MCG/ACT inhaler   aspirin EC 81 MG tablet   AUVI-Q 0.3 MG/0.3ML SOAJ injection   azaTHIOprine (IMURAN) 50 MG tablet   azelastine (ASTELIN) 137 MCG/SPRAY  nasal spray   b complex vitamins capsule   BEPREVE 1.5 % SOLN   cetirizine (ZYRTEC) 10 MG tablet   Cholecalciferol (VITAMIN D) 50 MCG (2000 UT) tablet   Evolocumab (REPATHA SURECLICK) 140 MG/ML SOAJ   fluticasone (FLONASE) 50 MCG/ACT nasal spray   furosemide (LASIX) 20 MG tablet   lansoprazole (PREVACID) 30 MG capsule   loteprednol (LOTEMAX) 0.2 % SUSP   montelukast (SINGULAIR) 10 MG tablet   Multiple Vitamin (MULITIVITAMIN WITH MINERALS) TABS   olmesartan (BENICAR) 20 MG tablet   ORENCIA 125 MG/ML SOSY   Potassium Citrate 15 MEQ (1620 MG) TBCR   predniSONE (DELTASONE) 10 MG tablet   triamcinolone cream (KENALOG) 0.1 %   No current facility-administered medications for this encounter.   Marcille Blanco [facility] Short Stay/Anesthesiology [facility] Phone [phone] 08/12/2023 2:49 PM

## 2023-08-12 NOTE — Anesthesia Preprocedure Evaluation (Addendum)
Anesthesia Evaluation  Patient identified by MRN, date of birth, ID band Patient awake    Reviewed: Allergy & Precautions, NPO status , Patient's Chart, lab work & pertinent test results  Airway Mallampati: III  TM Distance: >3 FB Neck ROM: Full    Dental no notable dental hx. (+) Dental Advisory Given, Teeth Intact   Pulmonary shortness of breath, asthma , former smoker   Pulmonary exam normal breath sounds clear to auscultation       Cardiovascular + CAD and +CHF  Normal cardiovascular exam Rhythm:Regular Rate:Normal  Echo 03/2023  1. Left ventricular ejection fraction, by estimation, is 60 to 65%. The left ventricle has normal function. The left ventricle has no regional wall motion abnormalities. There is mild concentric left ventricular hypertrophy. Left ventricular diastolic parameters were normal.   2. Right ventricular systolic function is normal. The right ventricular size is normal. There is mildly elevated pulmonary artery systolic pressure.   3. Left atrial size was moderately dilated.   4. The mitral valve is normal in structure. Mild mitral valve regurgitation. No evidence of mitral stenosis.   5. The aortic valve is tricuspid. Aortic valve regurgitation is not \\visualized . No aortic stenosis is present.   6. The inferior vena cava is normal in size with greater than 50% respiratory variability, suggesting right atrial pressure of 3 mmHg.     Neuro/Psych  Headaches Muscle weakness in bilateral lower extremeties [place]   Neuromuscular disease  negative psych ROS   GI/Hepatic ,GERD  Medicated and Controlled,,(+) Hepatitis -  Endo/Other  Hyponatremia   Renal/GU Renal disease     Musculoskeletal  (+) Arthritis ,    Abdominal   Peds  Hematology  (+) Blood dyscrasia, anemia   Anesthesia Other Findings MYOSITIS  Reproductive/Obstetrics                             Anesthesia  Physical Anesthesia Plan  ASA: 3  Anesthesia Plan: General   Post-op Pain Management: Minimal or no pain anticipated   Induction: Intravenous  PONV Risk Score and Plan: 1 and Ondansetron, Dexamethasone, Midazolam, Propofol infusion and Treatment may vary due to age or medical condition  Airway Management Planned: LMA  Additional Equipment:   Intra-op Plan:   Post-operative Plan: Extubation in OR  Informed Consent: I have reviewed the patients History and Physical, chart, labs and discussed the procedure including the risks, benefits and alternatives for the proposed anesthesia with the patient or authorized representative who has indicated his/her understanding and acceptance.     Dental advisory given  Plan Discussed with: CRNA  Anesthesia Plan Comments: (Risks of anesthesia explained at length. This includes, but is not limited to, sore throat, damage to teeth, lips gums, tongue and vocal cords, nausea and vomiting, reactions to medications, stroke, heart attack, and death. All patient questions were answered and the patient wishes to proceed.  See PAT note from 11/22 by Sherlie Ban PA-C )       Anesthesia Quick Evaluation

## 2023-08-30 ENCOUNTER — Encounter (HOSPITAL_COMMUNITY)
Admission: RE | Disposition: A | Discharge status: Home / Self Care | Payer: Self-pay | Source: Home / Self Care | Attending: Urology

## 2023-08-30 ENCOUNTER — Ambulatory Visit (HOSPITAL_COMMUNITY): Payer: Managed Care, Other (non HMO) | Admitting: Medical

## 2023-08-30 ENCOUNTER — Encounter (HOSPITAL_COMMUNITY): Payer: Self-pay | Admitting: Urology

## 2023-08-30 ENCOUNTER — Other Ambulatory Visit: Payer: Self-pay

## 2023-08-30 ENCOUNTER — Ambulatory Visit (HOSPITAL_COMMUNITY): Payer: Managed Care, Other (non HMO)

## 2023-08-30 ENCOUNTER — Ambulatory Visit (HOSPITAL_BASED_OUTPATIENT_CLINIC_OR_DEPARTMENT_OTHER): Payer: Managed Care, Other (non HMO) | Admitting: Medical

## 2023-08-30 ENCOUNTER — Ambulatory Visit (HOSPITAL_COMMUNITY)
Admission: RE | Admit: 2023-08-30 | Discharge: 2023-08-30 | Disposition: A | Discharge status: Home / Self Care | Payer: Managed Care, Other (non HMO) | Attending: Urology | Admitting: Urology

## 2023-08-30 DIAGNOSIS — N200 Calculus of kidney: Secondary | ICD-10-CM | POA: Diagnosis present

## 2023-08-30 DIAGNOSIS — K219 Gastro-esophageal reflux disease without esophagitis: Secondary | ICD-10-CM | POA: Insufficient documentation

## 2023-08-30 DIAGNOSIS — J4520 Mild intermittent asthma, uncomplicated: Secondary | ICD-10-CM | POA: Insufficient documentation

## 2023-08-30 DIAGNOSIS — I509 Heart failure, unspecified: Secondary | ICD-10-CM | POA: Insufficient documentation

## 2023-08-30 DIAGNOSIS — Z79899 Other long term (current) drug therapy: Secondary | ICD-10-CM | POA: Insufficient documentation

## 2023-08-30 DIAGNOSIS — Z87891 Personal history of nicotine dependence: Secondary | ICD-10-CM | POA: Diagnosis not present

## 2023-08-30 DIAGNOSIS — I2510 Atherosclerotic heart disease of native coronary artery without angina pectoris: Secondary | ICD-10-CM | POA: Insufficient documentation

## 2023-08-30 HISTORY — PX: CYSTOSCOPY/URETEROSCOPY/HOLMIUM LASER/STENT PLACEMENT: SHX6546

## 2023-08-30 SURGERY — CYSTOSCOPY/URETEROSCOPY/HOLMIUM LASER/STENT PLACEMENT
Anesthesia: General | Site: Ureter | Laterality: Left

## 2023-08-30 MED ORDER — CHLORHEXIDINE GLUCONATE 0.12 % MT SOLN
15.0000 mL | Freq: Once | OROMUCOSAL | Status: AC
  Administered 2023-08-30: 15 mL via OROMUCOSAL

## 2023-08-30 MED ORDER — LIDOCAINE HCL (CARDIAC) PF 100 MG/5ML IV SOSY
PREFILLED_SYRINGE | INTRAVENOUS | Status: DC | PRN
  Administered 2023-08-30: 80 mg via INTRAVENOUS

## 2023-08-30 MED ORDER — MIDAZOLAM HCL 5 MG/5ML IJ SOLN
INTRAMUSCULAR | Status: DC | PRN
  Administered 2023-08-30: 2 mg via INTRAVENOUS

## 2023-08-30 MED ORDER — IOHEXOL 300 MG/ML  SOLN
INTRAMUSCULAR | Status: DC | PRN
  Administered 2023-08-30: 4 mL

## 2023-08-30 MED ORDER — PROPOFOL 10 MG/ML IV BOLUS
INTRAVENOUS | Status: AC
  Filled 2023-08-30: qty 20

## 2023-08-30 MED ORDER — MIDAZOLAM HCL 2 MG/2ML IJ SOLN
INTRAMUSCULAR | Status: AC
  Filled 2023-08-30: qty 2

## 2023-08-30 MED ORDER — SODIUM CHLORIDE 0.9 % IR SOLN
Status: DC | PRN
  Administered 2023-08-30: 3000 mL

## 2023-08-30 MED ORDER — ORAL CARE MOUTH RINSE: 15.0000 mL | Freq: Once | OROMUCOSAL | Status: AC

## 2023-08-30 MED ORDER — METHYLPREDNISOLONE SODIUM SUCC 125 MG IJ SOLR
INTRAMUSCULAR | Status: AC
  Filled 2023-08-30: qty 2

## 2023-08-30 MED ORDER — PHENYLEPHRINE HCL-NACL 20-0.9 MG/250ML-% IV SOLN
INTRAVENOUS | Status: AC
  Filled 2023-08-30: qty 250

## 2023-08-30 MED ORDER — PROPOFOL 10 MG/ML IV BOLUS
INTRAVENOUS | Status: DC | PRN
  Administered 2023-08-30: 150 mg via INTRAVENOUS

## 2023-08-30 MED ORDER — FENTANYL CITRATE PF 50 MCG/ML IJ SOSY
25.0000 ug | PREFILLED_SYRINGE | INTRAMUSCULAR | Status: DC | PRN
  Administered 2023-08-30: 25 ug via INTRAVENOUS

## 2023-08-30 MED ORDER — FENTANYL CITRATE (PF) 100 MCG/2ML IJ SOLN
INTRAMUSCULAR | Status: AC
  Filled 2023-08-30: qty 2

## 2023-08-30 MED ORDER — SODIUM CHLORIDE 0.9 % IV SOLN
2.0000 g | INTRAVENOUS | Status: AC
  Administered 2023-08-30: 2 g via INTRAVENOUS
  Filled 2023-08-30: qty 20

## 2023-08-30 MED ORDER — FENTANYL CITRATE (PF) 100 MCG/2ML IJ SOLN
INTRAMUSCULAR | Status: DC | PRN
  Administered 2023-08-30 (×4): 25 ug via INTRAVENOUS

## 2023-08-30 MED ORDER — DEXAMETHASONE SODIUM PHOSPHATE 10 MG/ML IJ SOLN
INTRAMUSCULAR | Status: DC | PRN
  Administered 2023-08-30: 10 mg via INTRAVENOUS

## 2023-08-30 MED ORDER — ONDANSETRON HCL 4 MG/2ML IJ SOLN
INTRAMUSCULAR | Status: AC
Start: 2023-08-30 — End: ?
  Filled 2023-08-30: qty 2

## 2023-08-30 MED ORDER — DEXAMETHASONE SODIUM PHOSPHATE 10 MG/ML IJ SOLN
INTRAMUSCULAR | Status: AC
  Filled 2023-08-30: qty 1

## 2023-08-30 MED ORDER — SODIUM CHLORIDE 0.9 % IV SOLN: INTRAVENOUS | Status: DC

## 2023-08-30 MED ORDER — LACTATED RINGERS IV SOLN: INTRAVENOUS | Status: DC

## 2023-08-30 MED ORDER — LIDOCAINE HCL (PF) 2 % IJ SOLN
INTRAMUSCULAR | Status: AC
  Filled 2023-08-30: qty 5

## 2023-08-30 MED ORDER — ONDANSETRON HCL 4 MG/2ML IJ SOLN
INTRAMUSCULAR | Status: DC | PRN
  Administered 2023-08-30: 4 mg via INTRAVENOUS

## 2023-08-30 MED ORDER — DROPERIDOL 2.5 MG/ML IJ SOLN
INTRAMUSCULAR | Status: AC
  Filled 2023-08-30: qty 2

## 2023-08-30 MED ORDER — FENTANYL CITRATE PF 50 MCG/ML IJ SOSY
PREFILLED_SYRINGE | INTRAMUSCULAR | Status: AC
  Administered 2023-08-30: 25 ug via INTRAVENOUS
  Filled 2023-08-30: qty 2

## 2023-08-30 MED ORDER — DROPERIDOL 2.5 MG/ML IJ SOLN
0.6250 mg | Freq: Once | INTRAMUSCULAR | Status: AC | PRN
  Administered 2023-08-30: 0.625 mg via INTRAVENOUS

## 2023-08-30 SURGICAL SUPPLY — 19 items
BAG URO CATCHER STRL LF (MISCELLANEOUS) ×1 IMPLANT
BASKET STONE 1.7 NGAGE (UROLOGICAL SUPPLIES) IMPLANT
BASKET ZERO TIP NITINOL 2.4FR (BASKET) IMPLANT
CATH URETL OPEN END 6FR 70 (CATHETERS) ×1 IMPLANT
CLOTH BEACON ORANGE TIMEOUT ST (SAFETY) ×1 IMPLANT
FIBER LASER MOSES 200 DFL (Laser) IMPLANT
FIBER LASER MOSES 365 DFL (Laser) IMPLANT
GLOVE BIO SURGEON STRL SZ7.5 (GLOVE) ×1 IMPLANT
GOWN STRL REUS W/ TWL XL LVL3 (GOWN DISPOSABLE) ×1 IMPLANT
GUIDEWIRE STR DUAL SENSOR (WIRE) ×1 IMPLANT
GUIDEWIRE ZIPWRE .038 STRAIGHT (WIRE) IMPLANT
KIT TURNOVER KIT A (KITS) IMPLANT
MANIFOLD NEPTUNE II (INSTRUMENTS) ×1 IMPLANT
PACK CYSTO (CUSTOM PROCEDURE TRAY) ×1 IMPLANT
SHEATH NAVIGATOR HD 11/13X28 (SHEATH) IMPLANT
SHEATH NAVIGATOR HD 11/13X36 (SHEATH) IMPLANT
STENT URET 6FRX26 CONTOUR (STENTS) IMPLANT
TUBING CONNECTING 10 (TUBING) ×1 IMPLANT
TUBING UROLOGY SET (TUBING) ×1 IMPLANT

## 2023-08-30 NOTE — Anesthesia Postprocedure Evaluation (Signed)
Anesthesia Post Note  Patient: Stuart Collins  Procedure(s) Performed: CYSTOSCOPY LEFT RETROGRADE PYELOGRAM URETEROSCOPY/HOLMIUM LASER/STENT PLACEMENT (Left: Ureter)     Patient location during evaluation: PACU Anesthesia Type: General Level of consciousness: sedated and patient cooperative Pain management: pain level controlled Vital Signs Assessment: post-procedure vital signs reviewed and stable Respiratory status: spontaneous breathing Cardiovascular status: stable Anesthetic complications: no   No notable events documented.  Last Vitals:  Vitals:   08/30/23 1019 08/30/23 1025  BP: (!) 149/85   Pulse: 71   Resp:    Temp:  36.6 C  SpO2: 98%     Last Pain:  Vitals:   08/30/23 1025  TempSrc: Oral  PainSc:                  Lewie Loron

## 2023-08-30 NOTE — Op Note (Signed)
Preoperative diagnosis: Left renal stones Postoperative diagnosis: Same  Procedure: Cystoscopy with left retrograde pyelogram, left ureteroscopy laser lithotripsy, left ureteral stent placement  Surgeon: Mena Goes  Anesthesia: General  Indication for procedure: Send 57 year old male with enlarging stones in the left kidney.  There was about a 12 mm stone in the left upper pole and a 12 mm stone in the left lower pole.  Findings: On exam the penis was uncircumcised, foreskin without lesion.  Glans and meatus appeared normal.  On cystoscopy the urethra, prostatic urethra and bladder were unremarkable.  No stone or foreign body.  No mucosal lesion.  Left retrograde pyelogram-this outlined a single ureter single collecting system unit with filling defects in the upper pole and lower pole consistent with the stones.  On left ureteroscopy stones were noted in the lower pole and 2 separate calyces and stones were noted in the upper pole branching into calyces.  All stone was dusted.  There was some fragments remaining on fluoroscopy but no significant fragments noted on ureteroscopy.  Description of procedure: After consent was obtained patient brought to the operating room.  After adequate anesthesia he is placed lithotomy position and prepped and draped in the usual sterile fashion.  Timeout was performed to confirm the patient and procedure.  The cystoscope was passed per urethra and the bladder inspected.  The left ureteral orifice was cannulated with a 5 [place] open-ended catheter and retrograde injection of contrast was performed.  A sensor wire was then advanced and coiled in the upper calyx.  I then used an access sheath to get 2 wires in place and went adjacent to the zip wire leaving it as the safety.  The access sheath went easily with minimal resistance.  The ureteroscope was then advanced up into the kidney and I approach the lower pole stones first.  The more posterior calyx was dusted.  And  then the more inferior calyx was fragmented.  1 fragment remained and it was grasped with an engage basket and brought up into the upper pole 1 drop for ease of fragmentation.  It was dusted here.  There was some fragments in the renal pelvis that were dusted.  I then went up into the upper pole and was able to dust and fragment the upper calyceal stones.  Inspection of the collecting system noted there to be no other significant stone fragments visually.  There were some scattered fragments on fluoroscopy but again these areas were inspected and everything appeared to be adequately fragmented.  The access sheath was backed down on the cystoscope and the collecting system renal pelvis and ureter again inspected on the way out and no significant stone fragment was noted and no injury.  There was no mucosal disruption in the ureter.  The wire was backloaded on the cystoscope and the scope inserted.  A 626 cm stent was advanced.  The wire was removed with a good coil seen in the kidney and a good coil in the bladder.  The bladder was drained and the scope removed.  He was awakened taken the cover room in stable condition.  Complications: None  Blood loss: Minimal  Specimens: None  Drains: 6 x 26 cm left ureteral stent  Disposition: Patient stable to PACU-will have him follow-up in the next 7 to 10 days with a KUB for likely stent removal.  We could consider follow-up ureteroscopy or shockwave if needed.

## 2023-08-30 NOTE — Anesthesia Procedure Notes (Signed)
Procedure Name: LMA Insertion Date/Time: 08/30/2023 7:38 AM  Performed by: Sampson Goon, CRNAPre-anesthesia Checklist: Patient identified, Emergency Drugs available, Suction available and Patient being monitored Patient Re-evaluated:Patient Re-evaluated prior to induction Oxygen Delivery Method: Circle System Utilized Preoxygenation: Pre-oxygenation with 100% oxygen Induction Type: IV induction LMA: LMA inserted LMA Size: 4.0 Number of attempts: 1 Placement Confirmation: positive ETCO2 Tube secured with: Tape Dental Injury: Teeth and Oropharynx as per pre-operative assessment

## 2023-08-30 NOTE — H&P (Signed)
H&P  Chief Complaint: left renal stones  History of Present Illness:  57 yo with h/o stones. Enlarging left UP - 12 mm - and LP - 12 mm - stones. He presents for left URS. No cough, congestion, fever. No dysuria or gross hematuria.    Past Medical History:  Diagnosis Date   Allergic arthritis    Asthma, mild intermittent    followed by dr Elvera Lennox. [facility] Cords ( allergy/ asthma center)   CHF (congestive heart failure) (HCC)    Coronary artery calcification seen on CAT scan 03/2014   cardiologist--- dr Rennis Golden;  calcium score=65.5 involving LAD   Dyspnea    due to weakness   Elevated liver enzymes    referred to [facility]--- dr Marca Ancona by pcp   Family history of adverse reaction to anesthesia    mother and father--- ponv   Family history of premature CAD    NUCLEAR STRESS TEST, 12/30/2002 - no evidence of ischemia   GERD (gastroesophageal reflux disease)    Hepatic steatosis determined by biopsy of liver 2003   History of basal cell carcinoma (BCC) excision 08/18/2014   12/ 2018 nodular right chest tx; cx3 3fu;   03/ 2023  right breast (curet and 5FU)   History of kidney stones    urologist--- dr Mena Goes   Hyperlipidemia, mixed    followed by dr hilty   Muscle weakness (generalized)    02-05-2023  per pt using cane   NASH (nonalcoholic steatohepatitis)    followed by dr Marca Ancona  (GI)   Nocturia    RA (rheumatoid arthritis) [facility])    rheumatologist--- dr Dierdre Forth;  multiple sites   Raynaud's disease    Sciatica, right side    Wears glasses    Past Surgical History:  Procedure Laterality Date   COLONOSCOPY     CYSTOSCOPY W/ URETERAL STENT PLACEMENT  12/02/2011   Procedure: CYSTOSCOPY WITH RETROGRADE PYELOGRAM/URETERAL STENT PLACEMENT;  Surgeon: Kathi Ludwig, MD;  Location: [facility];  Service: Urology;  Laterality: Right;   CYSTOSCOPY W/ URETERAL STENT PLACEMENT  12/15/2011   Procedure: CYSTOSCOPY WITH STENT REPLACEMENT;  Surgeon: Milford Cage, MD;  Location: [facility];   Service: Urology;  Laterality: Right;  right reter stent removal and stent placement   CYSTOSCOPY/RETROGRADE/URETEROSCOPY  12/15/2011   Procedure: CYSTOSCOPY/RETROGRADE/URETEROSCOPY;  Surgeon: Milford Cage, MD;  Location: [facility];  Service: Urology;  Laterality: Right;  no retrograde   LIVER BIOPSY  05/29/2002   MUSCLE BIOPSY Left 02/15/2023   Procedure: LEFT THIGH MUSCLE BIOPSY;  Surgeon: Fritzi Mandes, MD;  Location: [facility];  Service: General;  Laterality: Left;   ORCHIOPEXY  1970   TONSILLECTOMY  1970   UPPER GI ENDOSCOPY  03/17/2002    Home Medications:  Medications Prior to Admission  Medication Sig Dispense Refill Last Dose/Taking   albuterol (VENTOLIN HFA) 108 (90 Base) MCG/ACT inhaler Inhale 1-2 puffs into the lungs every 4 (four) hours as needed for wheezing or shortness of breath.   Taking As Needed   aspirin EC 81 MG tablet Take 81 mg by mouth every other day. Swallow whole.   Past Week   AUVI-Q 0.3 MG/0.3ML SOAJ injection Inject 0.3 mg into the muscle as needed for anaphylaxis.   Taking As Needed   azaTHIOprine (IMURAN) 50 MG tablet Take 100 mg by mouth 2 (two) times daily.   08/29/2023   azelastine (ASTELIN) 137 MCG/SPRAY nasal spray Place 1 spray into the nose 2 (two) times daily.  08/29/2023   b complex vitamins capsule Take 1 capsule by mouth daily.   Past Week   BEPREVE 1.5 % SOLN Place 1 drop into both eyes 2 (two) times daily as needed for allergies.  5 08/29/2023   cetirizine (ZYRTEC) 10 MG tablet Take 10 mg by mouth at bedtime.    08/29/2023   Cholecalciferol (VITAMIN D) 50 MCG (2000 UT) tablet Take 2,000 Units by mouth daily.   Past Week   Evolocumab (REPATHA SURECLICK) 140 MG/ML SOAJ Inject 140 mg into the skin every 14 (fourteen) days. 6 mL 3 Past Month   fluticasone (FLONASE) 50 MCG/ACT nasal spray Place 1 spray into the nose 2 (two) times daily.   08/29/2023   furosemide (LASIX) 20 MG tablet Take 1 tablet (20 mg total) by mouth daily.  (Patient taking differently: Take 20 mg by mouth daily as needed for edema.) 90 tablet 3 Taking Differently   lansoprazole (PREVACID) 30 MG capsule Take 30 mg by mouth 2 (two) times daily before a meal.   08/30/2023 at  3:45 AM   loteprednol (LOTEMAX) 0.2 % SUSP Place 1 drop into both eyes 4 (four) times daily as needed (allergies).   08/29/2023   montelukast (SINGULAIR) 10 MG tablet Take 10 mg by mouth at bedtime.   08/29/2023   Multiple Vitamin (MULITIVITAMIN WITH MINERALS) TABS Take 1 tablet by mouth daily with breakfast.   Past Week   olmesartan (BENICAR) 20 MG tablet Take 1 tablet (20 mg total) by mouth daily. 90 tablet 3 08/29/2023   ORENCIA 125 MG/ML SOSY Inject 125 mg into the skin every Sunday.   Past Week   Potassium Citrate 15 MEQ (1620 MG) TBCR Take 15 mEq by mouth daily.   Past Week   predniSONE (DELTASONE) 10 MG tablet Take 10 mg by mouth daily with breakfast.   08/29/2023   triamcinolone cream (KENALOG) 0.1 % Apply topically daily. (Patient taking differently: Apply 1 Application topically daily as needed (for itching).) 454 g 6 Taking   Allergies:  Allergies  Allergen Reactions   Mushroom Extract Complex (Do Not Select) Shortness Of Breath and Other (See Comments)    All types mushrooms   Other Shortness Of Breath and Other (See Comments)    "ALL NUTS"   Peanut-Containing Drug Products Shortness Of Breath   Shellfish Allergy Hives, Shortness Of Breath and Other (See Comments)    All shellfish   Tape Rash and Other (See Comments)    The CLEAR, PLASTIC tape used in the hospital BREAKS OUT THE SKIN!!   Morphine Other (See Comments)     "makes my head feel like its stuffed; like it's going to pop"   Codeine Other (See Comments)     "makes my head feel like its stuffed; like it's going to pop"    Family History  Problem Relation Age of Onset   Kidney Stones Mother    Arthritis Mother 31       Osteo   Heart disease Father 24   Heart attack Maternal Grandfather 28    Hyperlipidemia Brother    Social History:  reports that he quit smoking about 44 years ago. His smoking use included cigarettes. He started smoking about 48 years ago. He has never used smokeless tobacco. He reports that he does not currently use alcohol. He reports that he does not use drugs.  ROS: A complete review of systems was performed.  All systems are negative except for pertinent findings as noted. Review of Systems  All other systems reviewed and are negative.    Physical Exam:  Vital signs in last 24 hours: Temp:  [98.1 F (36.7 C)] 98.1 F (36.7 C) (12/13 0547) Pulse Rate:  [65] 65 (12/13 0547) Resp:  [16] 16 (12/13 0547) BP: (146)/(87) 146/87 (12/13 0547) SpO2:  [100 %] 100 % (12/13 0547) Weight:  [76.2 kg] 76.2 kg (12/13 0540) General:  Alert and oriented, No acute distress HEENT: Normocephalic, atraumatic Cardiovascular: Regular rate and rhythm Lungs: Regular rate and effort Abdomen: Soft, nontender, nondistended, no abdominal masses Back: No CVA tenderness Extremities: No edema Neurologic: Grossly intact  Laboratory Data:  No results found for this or any previous visit (from the past 24 hours). No results found for this or any previous visit (from the past 240 hours). Creatinine: No results for input(s): "CREATININE" in the last 168 hours.  Impression/Assessment:  Left renal stones -   Plan:  I discussed with the patient the nature, potential benefits, risks and alternatives to cystoscopy, left ureteroscopy, laser and stent, including side effects of the proposed treatment, the likelihood of the patient achieving the goals of the procedure, and any potential problems that might occur during the procedure or recuperation. Discussed he may need a staged procedure. All questions answered. Patient elects to proceed.    Jerilee Field 08/30/2023, 7:19 AM

## 2023-08-30 NOTE — Transfer of Care (Signed)
Immediate Anesthesia Transfer of Care Note  Patient: Stuart Collins  Procedure(s) Performed: CYSTOSCOPY LEFT RETROGRADE PYELOGRAM URETEROSCOPY/HOLMIUM LASER/STENT PLACEMENT (Left: Ureter)  Patient Location: PACU  Anesthesia Type:General  Level of Consciousness: awake  Airway & Oxygen Therapy: Patient Spontanous Breathing  Post-op Assessment: Report given to RN and Post -op Vital signs reviewed and stable  Post vital signs: Reviewed and stable  Last Vitals:  Vitals Value Taken Time  BP 139/97 08/30/23 0922  Temp    Pulse 93 08/30/23 0924  Resp 17 08/30/23 0924  SpO2 95 % 08/30/23 0924  Vitals shown include unfiled device data.  Last Pain:  Vitals:   08/30/23 0547  TempSrc: Oral  PainSc:          Complications: No notable events documented.

## 2023-08-31 ENCOUNTER — Encounter (HOSPITAL_COMMUNITY): Payer: Self-pay | Admitting: Urology

## 2023-11-13 LAB — NMR, LIPOPROFILE
Cholesterol, Total: 248 mg/dL — ABNORMAL HIGH (ref 100–199)
HDL Particle Number: 36.4 umol/L (ref >=30.5)
HDL-C: 80 mg/dL (ref >39)
LDL Particle Number: 1368 nmol/L — ABNORMAL HIGH (ref <1000)
LDL Size: 21.8 nmol (ref >20.5)
LDL-C (NIH Calc): 132 mg/dL — ABNORMAL HIGH (ref 0–99)
LP-IR Score: 47 — ABNORMAL HIGH (ref <=45)
Small LDL Particle Number: <90 nmol/L (ref <=527)
Triglycerides: 207 mg/dL — ABNORMAL HIGH (ref 0–149)

## 2023-12-09 LAB — LAB REPORT - SCANNED: EGFR: 86

## 2023-12-13 ENCOUNTER — Encounter: Payer: Self-pay | Admitting: Internal Medicine

## 2023-12-13 ENCOUNTER — Ambulatory Visit: Payer: Managed Care, Other (non HMO) | Attending: Internal Medicine | Admitting: Internal Medicine

## 2023-12-13 VITALS — BP 122/80 | HR 79 | Ht 69.0 in | Wt 182.6 lb

## 2023-12-13 DIAGNOSIS — E785 Hyperlipidemia, unspecified: Secondary | ICD-10-CM | POA: Diagnosis not present

## 2023-12-13 DIAGNOSIS — R748 Abnormal levels of other serum enzymes: Secondary | ICD-10-CM | POA: Diagnosis not present

## 2023-12-13 DIAGNOSIS — R931 Abnormal findings on diagnostic imaging of heart and coronary circulation: Secondary | ICD-10-CM | POA: Diagnosis not present

## 2023-12-13 DIAGNOSIS — I10 Essential (primary) hypertension: Secondary | ICD-10-CM

## 2023-12-13 NOTE — Patient Instructions (Signed)
 Medication Instructions:  The current medical regimen is effective;  continue present plan and medications as directed. Please refer to the Current Medication list given to you today.  *If you need a refill on your cardiac medications before your next appointment, please call your pharmacy*  Lab Work: FASTING NMR TO BE DONE 1 MONTH BEFORE SCHEDULED APPOINTMENT If you have labs (blood work) drawn today and your tests are completely normal, you will receive your results only by:  MyChart Message (if you have MyChart) OR  A paper copy in the mail If you have any lab test that is abnormal or we need to change your treatment, we will call you to review the results.  Follow-Up: At [facility], you and your health needs are our priority.  As part of our continuing mission to provide you with exceptional heart care, our providers are all part of one team.  This team includes your primary Cardiologist (physician) and Advanced Practice Providers or APPs (Physician Assistants and Nurse Practitioners) who all work together to provide you with the care you need, when you need it.  Your next appointment:   12 month(s)  Provider:   Chrystie Nose, MD              1st Floor: - Lobby - Registration  - Pharmacy  - Lab - Cafe  2nd Floor: - PV Lab - Diagnostic Testing (echo, CT, nuclear med)  3rd Floor: - Vacant  4th Floor: - TCTS (cardiothoracic surgery) - AFib Clinic - Structural Heart Clinic - Vascular Surgery  - Vascular Ultrasound  5th Floor: - HeartCare Cardiology (general and EP) - Clinical Pharmacy for coumadin, hypertension, lipid, weight-loss medications, and med management appointments    Valet parking services will be available as well.

## 2023-12-13 NOTE — Progress Notes (Signed)
 OFFICE NOTE  Chief Complaint:  Routine follow-up  Primary Care Physician: Ralene Ok, MD  HPI:  Stuart Collins is a 58 year old gentleman with a history of rheumatoid arthritis, on methotrexate and Orencia. He also has dyslipidemia, for which we are following. From a cholesterol standpoint, he follows a very low-cholesterol diet; however, does have a history of steatohepatitis and is not currently on a statin, and is taking Zetia and fenofibrate for management of this. From what I could tell, his last lipid profile was in April of 2012 and would bear repeating at this time. Otherwise, he is asymptomatic. When following his lipid profile, we saw that he did have a lower LDL cholesterol when he was exercising more. Recently spent taking care of his mother who had spinal surgery and his fallen off of his exercise routine. He wishes to get back to that, to modify his risk factors for coronary disease.  Stuart Collins returns for followup today and is doing well. His cholesterol maintains good control with an LDL of 95. He is not on a statin due to a history of steatohepatitis.  He is curious as to whether or not he has any coronary artery disease at this point or not.  I saw Stuart Collins back in the office today. Overall he is doing well. He reports his arthritis is well controlled on the Orencia. He's currently on Zetia 10 mg, fenofibrate 160 and Livalo 2 mg daily. Livalo was specifically chosen because of his history of steatohepatitis. There is clear evidence that this medicine is very unlikely to cause liver enzyme abnormalities, therefore there is no generic statin alternative for this medication. He reports his exercise has been less than he would desire. Weight is fairly stable. Blood pressure is well-controlled.  03/15/2016  Stuart Collins is seen today for follow-up. Over the past year he said no new events or complaints. He reports his arthritis is fairly well-controlled. He is on that area,  fenofibrate and atorvastatin for cholesterol. He recently had lab work including a metabolic profile and lipid profile drawn by her primary care provider. I will request those records today.  03/15/2017  Stuart Collins is seen today in follow-up. He reports good control over his rheumatoid arthritis. Recently his rheumatologist retired but he's switched over to see Dr. Dierdre Forth. His dyslipidemia was recently assessed by his primary care provider will obtain those results to review. He is on A statin, ezetimibe and fenofibrate. His goal LDL-C is less than 70 with his history of mid LAD coronary artery calcification seen on calcium scoring. He is asymptomatic and denies any chest pain or worsening shortness of breath. He reports his liver enzymes have been stable. He exercises 2-3 times a week on a treadmill.  02/17/2018  Stuart Collins returns today for routine follow-up.  Overall he is well and denies any chest pain or worsening shortness of breath.  He continues to have some rheumatologic pain.  He is on Orencia.  He takes ezetimibe, fenofibrate and Livalo, and has a history of mild LAD coronary artery calcification.  He occasionally has some kidney stones and seems to have side effects with narcotics.  I mentioned he could consider taking Toradol for the pain.  We do not have recent lab work.  In the past his LDL had been in the low 80s.  He may benefit from a slight increase in his Livalo.  I mentioned that there is a little supportive evidence for over-the-counter fish oil, in fact it  may be harmful in some instances.  02/23/2018  Stuart Collins returns today for follow-up now a few days after his birthday.  Overall he is doing very well.  He again reports good control over his rheumatoid arthritis.  He said cholesterol was just assessed yesterday and those lab results were not available.  He thinks he might have some adverse symptoms related to ezetimibe, reporting that he has had some GI upset which could be caused by  that medication.  Ultimately, we might be able to increase his Livalo further and decrease or stop the ezetimibe to see if his symptoms improve.  03/02/2021  Stuart Collins returns today for annual follow-up.  Overall he is doing well.  Denies any chest pain or shortness of breath.  He recently got a treadmill and intends to start using it.  He is lost a little weight.  His cholesterol looks excellent.  This is been assessed by his PCP.  Last checked about a week ago.  Total cholesterol is 127, triglycerides 123, HDL 36 and LDL of 66.  His LDL is trended downwards from 83-77 is 66.  He is now at target.  Liver enzymes are elevated which has been somewhat persistent.  ALT of 104 and AST 86, however these are less than 3 times upper limit of normal which were 55 and 46 respectively based on this lab.  He also continues to follow with rheumatology.  EKGs from Dr. Rennie Plowman office shows sinus rhythm and here again also shows sinus rhythm.  Blood pressure is well controlled today at 110/62.  07/27/2022  Stuart Collins is seen today in follow-up.  He tells me that recently liver enzymes have gone up somewhat.  He is followed closely at [facility] but does not see a hematologist currently.  Last numbers I saw showed an AST 105 ALT 124.  He is on 3 lipid-lowering agents including Livalo 2 mg daily, fenofibrate 160 mg daily and ezetimibe 10 mg daily.  These medicines have not been adjusted.  He tells me he was taken off of the Uroxatrol.  He has not had repeat lipids recently.  His last labs in June showed total cholesterol 127, triglycerides 136, HDL 37 and LDL 68.  06/19/2023  Stuart Collins is seen today in follow-up.  He was recently seen in follow-up by Bernadene Person, DNP for hospital follow-up of hypervolemia.  This occurred in May 2024.  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  He has been on steroid therapy for this and recently had increases in his azathioprine to try to minimize  the steroid dosing.  Cardiology was consulted and echo showed normal LVEF with mild RV enlargement and some pulmonary hypertension.  It was not thought that this was due to acute heart failure exacerbation.  Follow-up echo in July 2024 showed normal LVEF, mild LVH and normal RV systolic function with moderate left atrial enlargement, mild mitral regurgitation, but normal diastolic function.  Subsequently he went off of daily Lasix and maintains a prescription to use as needed.  He was also added on with olmesartan hand.  He is taking that medication with good control of her blood pressure today 130/80.  His lipids however have been not controlled.  He has been off of statin therapy due to elevated liver enzymes and now with a diagnosis of polymyositis I would be very hesitant to use a statin infected contraindicated.  He is recent lipids in April showed total cholesterol 165,  HDL 25, triglycerides 228 and LDL 101.  He does have underlying coronary artery calcification with a calcium score of 65 involving the LAD back in 2015, but he has not had any recent imaging.  12/13/2023  Stuart Collins is seen today in follow-up.  Overall he seems to be doing fairly well.  He is followed closely by Dr. Dierdre Forth for polymyositis and recent CK was in the 500s.  He has been on decreasing doses of steroid and intermittently was on azathioprine but taken off of that.  He seems to be tolerating Repatha.  His only complaints are recently cramping in his hands.  He does have Raynaud's but he says it has not been severe enough to take medication for.  He denies any chest pain or worsening shortness of breath.  EKG shows a normal sinus rhythm.  His liver enzymes have improved after stopping statin therapy on Repatha.  AST and ALT recently more 42 and 46.  In February his LDL particle number was 1368 with LDL 132, HDL 80 and triglycerides 207.  He had repeat testing with his primary care provider a month later which showed a lower LDL at 97  however calculated as his triglycerides were over 500.  He is not clear why his triglycerides were high however I suspect it may be diet related and decreased activity.  PMHx:  Past Medical History:  Diagnosis Date   Allergic arthritis    Asthma, mild intermittent    followed by dr Elvera Lennox. Irena Cords ([facility])   CHF (congestive heart failure) (HCC)    Coronary artery calcification seen on CAT scan 03/2014   cardiologist--- dr Rennis Golden;  calcium score=65.5 involving LAD   Dyspnea    due to weakness   Elevated liver enzymes    referred to [facility]--- dr Marca Ancona by pcp   Family history of adverse reaction to anesthesia    mother and father--- ponv   Family history of premature CAD    NUCLEAR STRESS TEST, 12/30/2002 - no evidence of ischemia   GERD (gastroesophageal reflux disease)    Hepatic steatosis determined by biopsy of liver 2003   History of basal cell carcinoma (BCC) excision 08/18/2014   12/ 2018 nodular right chest tx; cx3 62fu;   03/ 2023  right breast (curet and 5FU)   History of kidney stones    urologist--- dr Mena Goes   Hyperlipidemia, mixed    followed by dr Frazier Balfour   Muscle weakness (generalized)    02-05-2023  per pt using cane   NASH (nonalcoholic steatohepatitis)    followed by dr Marca Ancona  (GI)   Nocturia    RA (rheumatoid arthritis) [facility])    rheumatologist--- dr Dierdre Forth;  multiple sites   Raynaud's disease    Sciatica, right side    Wears glasses     Past Surgical History:  Procedure Laterality Date   COLONOSCOPY     CYSTOSCOPY W/ URETERAL STENT PLACEMENT  12/02/2011   Procedure: CYSTOSCOPY WITH RETROGRADE PYELOGRAM/URETERAL STENT PLACEMENT;  Surgeon: Kathi Ludwig, MD;  Location: [facility];  Service: Urology;  Laterality: Right;   CYSTOSCOPY W/ URETERAL STENT PLACEMENT  12/15/2011   Procedure: CYSTOSCOPY WITH STENT REPLACEMENT;  Surgeon: Milford Cage, MD;  Location: [facility];  Service: Urology;  Laterality: Right;  right reter stent  removal and stent placement   CYSTOSCOPY/RETROGRADE/URETEROSCOPY  12/15/2011   Procedure: CYSTOSCOPY/RETROGRADE/URETEROSCOPY;  Surgeon: Milford Cage, MD;  Location: [facility];  Service: Urology;  Laterality: Right;  no retrograde   CYSTOSCOPY/URETEROSCOPY/HOLMIUM LASER/STENT PLACEMENT Left 08/30/2023   Procedure: CYSTOSCOPY LEFT RETROGRADE PYELOGRAM URETEROSCOPY/HOLMIUM LASER/STENT PLACEMENT;  Surgeon: Jerilee Field, MD;  Location: [facility];  Service: Urology;  Laterality: Left;  75 MINS   LIVER BIOPSY  05/29/2002   MUSCLE BIOPSY Left 02/15/2023   Procedure: LEFT THIGH MUSCLE BIOPSY;  Surgeon: Fritzi Mandes, MD;  Location: [facility];  Service: General;  Laterality: Left;   ORCHIOPEXY  1970   TONSILLECTOMY  1970   UPPER GI ENDOSCOPY  03/17/2002    FAMHx:  Family History  Problem Relation Age of Onset   Kidney Stones Mother    Arthritis Mother 17       Osteo   Heart disease Father 57   Heart attack Maternal Grandfather 15   Hyperlipidemia Brother     SOCHx:   reports that he quit smoking about 45 years ago. His smoking use included cigarettes. He started smoking about 49 years ago. He has never used smokeless tobacco. He reports that he does not currently use alcohol. He reports that he does not use drugs.  ALLERGIES:  Allergies  Allergen Reactions   Mushroom Extract Complex (Obsolete) Shortness Of Breath and Other (See Comments)    All types mushrooms   Other Shortness Of Breath and Other (See Comments)    "ALL NUTS"   Peanut-Containing Drug Products Shortness Of Breath   Shellfish Allergy Hives, Shortness Of Breath and Other (See Comments)    All shellfish   Tape Rash and Other (See Comments)    The CLEAR, PLASTIC tape used in the hospital BREAKS OUT THE SKIN!!   Morphine Other (See Comments)     "makes my head feel like its stuffed; like it's going to pop"   Codeine Other (See Comments)     "makes my head feel like its stuffed; like it's going to  pop"    ROS: Pertinent items noted in HPI and remainder of comprehensive ROS otherwise negative.  HOME MEDS: Current Outpatient Medications  Medication Sig Dispense Refill   albuterol (VENTOLIN HFA) 108 (90 Base) MCG/ACT inhaler Inhale 1-2 puffs into the lungs every 4 (four) hours as needed for wheezing or shortness of breath.     aspirin EC 81 MG tablet Take 81 mg by mouth every other day. Swallow whole.     AUVI-Q 0.3 MG/0.3ML SOAJ injection Inject 0.3 mg into the muscle as needed for anaphylaxis.     azelastine (ASTELIN) 137 MCG/SPRAY nasal spray Place 1 spray into the nose 2 (two) times daily.     BEPREVE 1.5 % SOLN Place 1 drop into both eyes 2 (two) times daily as needed for allergies.  5   cetirizine (ZYRTEC) 10 MG tablet Take 10 mg by mouth at bedtime.      Cholecalciferol (VITAMIN D) 50 MCG (2000 UT) tablet Take 2,000 Units by mouth daily.     Evolocumab (REPATHA SURECLICK) 140 MG/ML SOAJ Inject 140 mg into the skin every 14 (fourteen) days. 6 mL 3   fluticasone (FLONASE) 50 MCG/ACT nasal spray Place 1 spray into the nose 2 (two) times daily.     lansoprazole (PREVACID) 30 MG capsule Take 30 mg by mouth 2 (two) times daily before a meal.     loteprednol (LOTEMAX) 0.2 % SUSP Place 1 drop into both eyes 4 (four) times daily as needed (allergies).     montelukast (SINGULAIR) 10 MG tablet Take 10 mg by mouth at bedtime.     Multiple Vitamin (  MULITIVITAMIN WITH MINERALS) TABS Take 1 tablet by mouth daily with breakfast.     olmesartan (BENICAR) 20 MG tablet Take 1 tablet (20 mg total) by mouth daily. 90 tablet 3   Potassium Citrate 15 MEQ (1620 MG) TBCR Take 15 mEq by mouth daily.     predniSONE (DELTASONE) 10 MG tablet Take 10 mg by mouth daily with breakfast.     triamcinolone cream (KENALOG) 0.1 % Apply topically daily. (Patient taking differently: Apply 1 Application topically daily as needed (for itching).) 454 g 6   No current facility-administered medications for this visit.     LABS/IMAGING: No results found for this or any previous visit (from the past 48 hours). No results found.  VITALS: BP 122/80 (BP Location: Left Arm, Patient Position: Sitting, Cuff Size: Normal)   Pulse 79   Ht 5\' 9"  (1.753 m)   Wt 182 lb 9.6 oz (82.8 kg)   SpO2 99%   BMI 26.97 kg/m   EXAM: General appearance: alert and no distress Neck: no carotid bruit, no JVD and thyroid not enlarged, symmetric, no tenderness/mass/nodules Lungs: clear to auscultation bilaterally Heart: regular rate and rhythm, S1, S2 normal, no murmur, click, rub or gallop Abdomen: soft, non-tender; bowel sounds normal; no masses,  no organomegaly Extremities: extremities normal, atraumatic, no cyanosis or edema Pulses: 2+ and symmetric Skin: Skin color, texture, turgor normal. No rashes or lesions Neurologic: Grossly normal Psych: Pleasant  EKG: EKG Interpretation Date/Time:  Friday December 13 2023 08:19:45 EDT Ventricular Rate:  78 PR Interval:  146 QRS Duration:  84 QT Interval:  346 QTC Calculation: 394 R Axis:   15  Text Interpretation: Normal sinus rhythm Normal ECG When compared with ECG of 19-Jun-2023 12:04, No significant change was found Confirmed by Zoila Shutter [phone]) on 12/13/2023 8:23:17 AM    ASSESSMENT: Rheumatoid arthritis Recently diagnosed polymyositis Hospitalization for hypervolemia (01/2023) Dyslipidemia, goal LDL less than 70, Elevated liver function tests - steatohepatitis Coronary artery disease-mid LAD coronary calcium (CAC 65 - 2015)  PLAN: 1.   Stuart Collins seems to be doing well.  He did have some mild coronary artery calcification in 2015.  His CAC score was 65.  We could consider repeating this as it has been 10 years.  His cholesterol still above target about 20 points.  He had tried triglycerides as well.  Hopefully with diet and more activity this will improve otherwise I will consider adding Vascepa to his Repatha.  His polymyositis seems to be reasonably stable  with CKs in the 500s.  Liver enzymes fortunately have improved to near normal.  Will continue our current therapies.  He should have repeat lipids in about 6 months likely through his primary care provider.  I will plan to see him back in a year or sooner if necessary.  Again if his triglycerides remain above 500, would recommend adding Vascepa 2 g twice daily.  Chrystie Nose, MD, [facility], FACP  [place]  [facility]  Medical Director of the [facility] Diplomate of the American Board of Clinical Lipidology Attending Cardiologist  Direct Dial: [phone]  Fax: [phone]  Website:  [email or web address] 12/13/2023, 8:23 AM

## 2024-04-29 LAB — LAB REPORT - SCANNED: EGFR: 103

## 2024-05-04 ENCOUNTER — Encounter: Payer: Self-pay | Admitting: Internal Medicine

## 2024-05-04 ENCOUNTER — Other Ambulatory Visit (HOSPITAL_COMMUNITY): Payer: Self-pay

## 2024-05-04 MED ORDER — OLMESARTAN MEDOXOMIL 20 MG PO TABS
20.0000 mg | ORAL_TABLET | Freq: Every day | ORAL | 3 refills | Status: DC
  Filled 2024-05-04: qty 30, 30d supply, fill #0
  Filled 2024-06-14: qty 30, 30d supply, fill #1
  Filled 2024-07-13: qty 30, 30d supply, fill #2

## 2024-05-23 ENCOUNTER — Other Ambulatory Visit: Payer: Self-pay | Admitting: Internal Medicine

## 2024-05-23 DIAGNOSIS — E785 Hyperlipidemia, unspecified: Secondary | ICD-10-CM

## 2024-05-23 DIAGNOSIS — R931 Abnormal findings on diagnostic imaging of heart and coronary circulation: Secondary | ICD-10-CM

## 2024-07-13 ENCOUNTER — Encounter: Payer: Self-pay | Admitting: Pharmacist

## 2024-07-13 ENCOUNTER — Other Ambulatory Visit: Payer: Self-pay

## 2024-07-14 ENCOUNTER — Other Ambulatory Visit (HOSPITAL_COMMUNITY): Payer: Self-pay

## 2024-08-10 ENCOUNTER — Encounter: Payer: Self-pay | Admitting: Internal Medicine

## 2024-08-10 MED ORDER — OLMESARTAN MEDOXOMIL 20 MG PO TABS: 20.0000 mg | ORAL_TABLET | Freq: Every day | ORAL | 1 refills | Status: AC

## 2024-12-07 ENCOUNTER — Ambulatory Visit: Admitting: Internal Medicine
